# Patient Record
Sex: Male | Born: 1945 | Race: White | Hispanic: No | Marital: Married | Smoking: Never smoker
Health system: Southern US, Community
[De-identification: ages and names within clinical notes are randomized; demographics above are authoritative.]

## PROBLEM LIST (undated history)

## (undated) DIAGNOSIS — E039 Hypothyroidism, unspecified: Secondary | ICD-10-CM

## (undated) DIAGNOSIS — M1990 Unspecified osteoarthritis, unspecified site: Secondary | ICD-10-CM

---

## 2012-10-23 ENCOUNTER — Telehealth: Payer: Self-pay | Admitting: *Deleted

## 2012-10-23 NOTE — Telephone Encounter (Signed)
[facility] faxed over notes with a request for a Screening Colon on pt. Pt has recent dx of prostate cancer and is in the process of making a decision on his tx. He has no hx in the [facility] and per our conversation, he has no other medical hx that could prevent him from having a Direct Colon. I also spoke with Cathlyn Parsons, CRNA and he sees no problem with a [facility] COLON.  Pt could not speak freely at work and I tried to explain, if COLON is not done before his prostate tx, he may wait months to have a COLON done. Also informed him, Dr Brunilda Payor may want this done prior to eliminate any other possible problems d/t the close proximity od the colon and prostate gland.  Pt will call me back.

## 2012-10-28 ENCOUNTER — Encounter: Payer: Self-pay | Admitting: Gastroenterology

## 2012-10-30 ENCOUNTER — Other Ambulatory Visit: Payer: Self-pay | Admitting: Urology

## 2012-11-07 ENCOUNTER — Ambulatory Visit: Payer: Self-pay | Admitting: Gastroenterology

## 2012-11-10 NOTE — Telephone Encounter (Signed)
Pt has never called me back.

## 2012-12-15 ENCOUNTER — Encounter (HOSPITAL_COMMUNITY): Payer: Self-pay | Admitting: Pharmacy Technician

## 2012-12-18 ENCOUNTER — Encounter (HOSPITAL_COMMUNITY): Payer: Self-pay

## 2012-12-18 ENCOUNTER — Encounter (HOSPITAL_COMMUNITY)
Admission: RE | Admit: 2012-12-18 | Discharge: 2012-12-18 | Disposition: A | Discharge status: Home / Self Care | Payer: 59 | Source: Ambulatory Visit | Attending: Urology | Admitting: Urology

## 2012-12-18 HISTORY — DX: Hypothyroidism, unspecified: E03.9

## 2012-12-18 HISTORY — DX: Unspecified osteoarthritis, unspecified site: M19.90

## 2012-12-18 LAB — ABO/RH: ABO/RH(D): B NEG

## 2012-12-18 LAB — CBC
HCT: 37.8 % — ABNORMAL LOW (ref 39.0–52.0)
Hemoglobin: 13.1 g/dL (ref 13.0–17.0)
MCH: 30.5 pg (ref 26.0–34.0)
MCHC: 34.7 g/dL (ref 30.0–36.0)
MCV: 87.9 fL (ref 78.0–100.0)
Platelets: 211 10*3/uL (ref 150–400)
RBC: 4.3 MIL/uL (ref 4.22–5.81)
RDW: 12.7 % (ref 11.5–15.5)
WBC: 7.2 10*3/uL (ref 4.0–10.5)

## 2012-12-18 NOTE — Patient Instructions (Signed)
Jermaine Soto  12/18/2012   Your procedure is scheduled on:  12/24/12   Report to [facility] at    0630  AM.  Call this number if you have problems the morning of surgery: [phone]   Remember:   Do not eat food or drink liquids after midnight.   Take these medicines the morning of surgery with A SIP OF WATER:    Do not wear jewelry,   Do not wear lotions, powders, or perfumes. .  . Men may shave face and neck.  Do not bring valuables to the hospital.  Contacts, dentures or bridgework may not be worn into surgery.  Leave suitcase in the car. After surgery it may be brought to your room.  For patients admitted to the hospital, checkout time is 11:00 AM the day of  discharge.       SEE CHG INSTRUCTION SHEET    Please read over the following fact sheets that you were given:  coughing and deep breathing exercises, leg exercises               Failure to comply with these instructions may result in cancellation of your surgery.                Patient Signature ____________________________              Nurse Signature _____________________________

## 2012-12-23 NOTE — H&P (Signed)
Reason For Visit     Robotic surgery consultation for recent prostate cancer diagnosis. Patient of Dr. Vernia Buff Nesi's  AUA score 10/2.    SHIM 8/25.   History of Present Illness  Jermaine Soto is currently 67 years of age. He was originally sent to our office with a PSA elevation of just over 6. Repeat PSA was better at 4.33 but there was a significantly reduced PSA to 12%. Digital rectum exam showed nothing of concern. The patient had moderate obstructive voiding symptoms with an AUA symptom score of 10. Prostate ultrasound revealed a 54 g gland. Biopsies were positive at the left apex and left lateral mid prostate. 2 of 12 cores were positive. Both were for Gleason 3+4 equals 7 cancer with 5-30% core involvement. No prior intra-abdominal surgery. The patient and his wife have undergone consultation with Dr. Brunilda Payor with regard to treatment options.   Past Medical History Problems  1. History of  Hyperthyroidism 242.90  Surgical History Problems  1. History of  Surgery Of Male Genitalia Vasectomy V25.2  Current Meds 1. Adult Aspirin Low Strength 81 MG Oral Tablet Dispersible; Therapy: (Recorded:11Apr2014) to 2. Levothyroxine Sodium 50 MCG Oral Tablet; Therapy: (Recorded:11Apr2014) to 3. Multi-Day TABS; Therapy: (Recorded:11Apr2014) to 4. Viagra 100 MG Oral Tablet; Therapy: (Recorded:11Apr2014) to 5. Vitamin D3 TABS; Therapy: (Recorded:11Apr2014) to  Allergies Medication  1. Penicillins  Family History Problems  1. Paternal history of  Acute Myocardial Infarction V17.3 2. Maternal history of  Acute Myocardial Infarction V17.3 3. Paternal history of  Death In The Family Father 62yrs, MI 4. Maternal history of  Death In The Family Mother 27yrs, MI 5. Paternal history of  Diabetes Mellitus V18.0 6. Maternal history of  Diabetes Mellitus V18.0 7. Family history of  Family Health Status Number Of Children 2 sons  Social History Problems  1. Alcohol Use 1-2 q month 2. Caffeine Use 3-4  qd 3. Former Smoker V15.82 smoked 4 yrs, nonsmoker for the past 41yrs 4. Marital History - Currently Married 5. Occupation: Advertising account executive  Review of Systems Genitourinary, constitutional, skin, eye, otolaryngeal, hematologic/lymphatic, cardiovascular, pulmonary, endocrine, musculoskeletal, gastrointestinal, neurological and psychiatric system(s) were reviewed and pertinent findings if present are noted.  Genitourinary: urinary frequency, nocturia, difficulty starting the urinary stream, weak urinary stream, urinary stream starts and stops and erectile dysfunction.  Constitutional: night sweats and feeling tired (fatigue).  Musculoskeletal: joint pain.    Vitals Vital Signs [Data Includes: Last 1 Day]  Blood Pressure: 128 / 75 Temperature: 97.7 F Heart Rate: 78  Well-developed well-nourished male in no acute distress Respiratory: Normal effort Cardiac: Regular rate and rhythm Abdomen: Soft nontender no palpable masses Extremities: No tenderness no edema Genitourinary: 2+ prostate without nodules. 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 Follow-up Schedule Surgery Office  Follow-up  Requested for: 20May2014  Discussion/Summary  The patient was counseled about the natural history of prostate cancer and the standard treatment options that are available for prostate cancer. It was explained to him how his age and life expectancy, clinical stage, Gleason score, and PSA affect his prognosis, the decision to proceed with additional staging studies, as well as how that information influences recommended treatment strategies. We discussed the roles for active surveillance, radiation therapy, surgical therapy, androgen deprivation, as well as ablative therapy options for the treatment of prostate cancer as appropriate to his individual cancer situation. We discussed the risks and benefits  of these options with regard to their impact on cancer control and also in terms of potential adverse events, complications, and impact on quiality of life particularly related to urinary, bowel, and sexual function. The patient was encouraged to ask questions throughout the discussion today and all questions were answered to his stated satisfaction. In addition, the patient was provided with and/or directed to appropriate resources and literature for further education about prostate cancer and treatment options.   We discussed surgical therapy for prostate cancer including the different available surgical approaches. We discussed, in detail, the risks and expectations of surgery with regard to cancer control, urinary control, and erectile function as well as the expected postoperative recovery process. The risks, potential complications/adverse events of radical prostatectomy as well as alternative options were explained to the patient.   We discussed surgical therapy for prostate cancer including the different available surgical approaches. We discussed, in detail, the risks and expectations of surgery with regard to cancer control, urinary control, and erectile function as well as the expected postoperative recovery process. Additional risks of surgery including but not limited to bleeding, infection, hernia formation, nerve damage, lymphocele formation, bowel/rectal injury potentially necessitating colostomy, damage to the urinary tract resulting in urine leakage, urethral stricture, and the cardiopulmonary risks such as myocardial infarction, stroke, death, venothromboembolism, etc. were explained. The risk of open surgical conversion for robotic/laparoscopic prostatectomy was also discussed.   45 minutes were spent in face to face consultation with patient today.

## 2012-12-24 ENCOUNTER — Encounter (HOSPITAL_COMMUNITY)
Admission: RE | Disposition: A | Discharge status: Home / Self Care | Payer: Self-pay | Source: Ambulatory Visit | Attending: Urology

## 2012-12-24 ENCOUNTER — Ambulatory Visit (HOSPITAL_COMMUNITY): Payer: 59 | Admitting: Anesthesiology

## 2012-12-24 ENCOUNTER — Encounter (HOSPITAL_COMMUNITY): Payer: Self-pay | Admitting: *Deleted

## 2012-12-24 ENCOUNTER — Encounter (HOSPITAL_COMMUNITY): Payer: Self-pay | Admitting: Anesthesiology

## 2012-12-24 ENCOUNTER — Observation Stay (HOSPITAL_COMMUNITY)
Admission: RE | Admit: 2012-12-24 | Discharge: 2012-12-25 | Disposition: A | Discharge status: Home / Self Care | Payer: 59 | Source: Ambulatory Visit | Attending: Urology | Admitting: Urology

## 2012-12-24 DIAGNOSIS — C61 Malignant neoplasm of prostate: Principal | ICD-10-CM | POA: Insufficient documentation

## 2012-12-24 DIAGNOSIS — Z01812 Encounter for preprocedural laboratory examination: Secondary | ICD-10-CM | POA: Insufficient documentation

## 2012-12-24 DIAGNOSIS — Z79899 Other long term (current) drug therapy: Secondary | ICD-10-CM | POA: Insufficient documentation

## 2012-12-24 DIAGNOSIS — Z0183 Encounter for blood typing: Secondary | ICD-10-CM | POA: Insufficient documentation

## 2012-12-24 DIAGNOSIS — E039 Hypothyroidism, unspecified: Secondary | ICD-10-CM | POA: Insufficient documentation

## 2012-12-24 HISTORY — PX: ROBOT ASSISTED LAPAROSCOPIC RADICAL PROSTATECTOMY: SHX5141

## 2012-12-24 LAB — COMPREHENSIVE METABOLIC PANEL
ALT: 10 U/L (ref 0–53)
AST: 17 U/L (ref 0–37)
Albumin: 3.3 g/dL — ABNORMAL LOW (ref 3.5–5.2)
Alkaline Phosphatase: 39 U/L (ref 39–117)
BUN: 15 mg/dL (ref 6–23)
CO2: 25 mEq/L (ref 19–32)
Calcium: 8.6 mg/dL (ref 8.4–10.5)
Chloride: 100 mEq/L (ref 96–112)
Creatinine, Ser: 0.88 mg/dL (ref 0.50–1.35)
GFR calc Af Amer: >90 mL/min (ref >90)
GFR calc non Af Amer: 87 mL/min — ABNORMAL LOW (ref >90)
Glucose, Bld: 137 mg/dL — ABNORMAL HIGH (ref 70–99)
Potassium: 4.4 mEq/L (ref 3.5–5.1)
Sodium: 134 mEq/L — ABNORMAL LOW (ref 135–145)
Total Bilirubin: 0.4 mg/dL (ref 0.3–1.2)
Total Protein: 6.4 g/dL (ref 6.0–8.3)

## 2012-12-24 LAB — TYPE AND SCREEN
ABO/RH(D): B NEG
Antibody Screen: NEGATIVE

## 2012-12-24 LAB — HEMOGLOBIN AND HEMATOCRIT, BLOOD
HCT: 36.5 % — ABNORMAL LOW (ref 39.0–52.0)
HCT: 36.5 % — ABNORMAL LOW (ref 39.0–52.0)
Hemoglobin: 12.7 g/dL — ABNORMAL LOW (ref 13.0–17.0)
Hemoglobin: 12.6 g/dL — ABNORMAL LOW (ref 13.0–17.0)

## 2012-12-24 SURGERY — ROBOTIC ASSISTED LAPAROSCOPIC RADICAL PROSTATECTOMY
Anesthesia: General | Wound class: Clean Contaminated

## 2012-12-24 MED ORDER — HYDROMORPHONE HCL PF 1 MG/ML IJ SOLN: 0.2500 mg | INTRAMUSCULAR | Status: DC | PRN

## 2012-12-24 MED ORDER — CISATRACURIUM BESYLATE (PF) 10 MG/5ML IV SOLN
INTRAVENOUS | Status: DC | PRN
  Administered 2012-12-24: 4 mg via INTRAVENOUS
  Administered 2012-12-24: 10 mg via INTRAVENOUS
  Administered 2012-12-24: 2 mg via INTRAVENOUS

## 2012-12-24 MED ORDER — STERILE WATER FOR IRRIGATION IR SOLN
Status: DC | PRN
  Administered 2012-12-24: 3000 mL

## 2012-12-24 MED ORDER — LACTATED RINGERS IV SOLN
INTRAVENOUS | Status: DC | PRN
  Administered 2012-12-24: 10:00:00

## 2012-12-24 MED ORDER — MEPERIDINE HCL 50 MG/ML IJ SOLN: 6.2500 mg | INTRAMUSCULAR | Status: DC | PRN

## 2012-12-24 MED ORDER — PROMETHAZINE HCL 25 MG/ML IJ SOLN
INTRAMUSCULAR | Status: AC
  Filled 2012-12-24: qty 1

## 2012-12-24 MED ORDER — PROPOFOL 10 MG/ML IV BOLUS
INTRAVENOUS | Status: DC | PRN
  Administered 2012-12-24: 150 mg via INTRAVENOUS

## 2012-12-24 MED ORDER — ACETAMINOPHEN 10 MG/ML IV SOLN
1000.0000 mg | Freq: Four times a day (QID) | INTRAVENOUS | Status: DC
  Administered 2012-12-24 – 2012-12-25 (×3): 1000 mg via INTRAVENOUS
  Filled 2012-12-24 (×6): qty 100

## 2012-12-24 MED ORDER — LACTATED RINGERS IV SOLN
INTRAVENOUS | Status: DC | PRN
  Administered 2012-12-24 (×2): via INTRAVENOUS

## 2012-12-24 MED ORDER — MORPHINE SULFATE 2 MG/ML IJ SOLN
INTRAMUSCULAR | Status: AC
  Administered 2012-12-24: 2 mg via INTRAVENOUS
  Filled 2012-12-24: qty 1

## 2012-12-24 MED ORDER — HYDROCODONE-ACETAMINOPHEN 5-325 MG PO TABS: 1.0000 | ORAL_TABLET | Freq: Four times a day (QID) | ORAL | Status: DC | PRN

## 2012-12-24 MED ORDER — MORPHINE SULFATE 2 MG/ML IJ SOLN
2.0000 mg | INTRAMUSCULAR | Status: DC | PRN
  Administered 2012-12-24 – 2012-12-25 (×3): 2 mg via INTRAVENOUS
  Filled 2012-12-24 (×4): qty 1

## 2012-12-24 MED ORDER — NEOSTIGMINE METHYLSULFATE 1 MG/ML IJ SOLN
INTRAMUSCULAR | Status: DC | PRN
  Administered 2012-12-24: 4 mg via INTRAVENOUS

## 2012-12-24 MED ORDER — LACTATED RINGERS IV SOLN: INTRAVENOUS | Status: DC

## 2012-12-24 MED ORDER — LIDOCAINE HCL (CARDIAC) 20 MG/ML IV SOLN
INTRAVENOUS | Status: DC | PRN
  Administered 2012-12-24: 100 mg via INTRAVENOUS

## 2012-12-24 MED ORDER — MIDAZOLAM HCL 5 MG/5ML IJ SOLN
INTRAMUSCULAR | Status: DC | PRN
  Administered 2012-12-24: 2 mg via INTRAVENOUS

## 2012-12-24 MED ORDER — INDIGOTINDISULFONATE SODIUM 8 MG/ML IJ SOLN
INTRAMUSCULAR | Status: DC | PRN
  Administered 2012-12-24 (×2): 40 mg via INTRAVENOUS

## 2012-12-24 MED ORDER — PROMETHAZINE HCL 25 MG/ML IJ SOLN
6.2500 mg | INTRAMUSCULAR | Status: DC | PRN
  Administered 2012-12-24: 6.25 mg via INTRAVENOUS

## 2012-12-24 MED ORDER — ONDANSETRON HCL 4 MG/2ML IJ SOLN
INTRAMUSCULAR | Status: AC
  Administered 2012-12-24: 4 mg
  Filled 2012-12-24: qty 2

## 2012-12-24 MED ORDER — BUPIVACAINE-EPINEPHRINE 0.25% -1:200000 IJ SOLN
INTRAMUSCULAR | Status: DC | PRN
  Administered 2012-12-24: 25 mL

## 2012-12-24 MED ORDER — SUFENTANIL CITRATE 50 MCG/ML IV SOLN
INTRAVENOUS | Status: DC | PRN
  Administered 2012-12-24 (×2): 10 ug via INTRAVENOUS
  Administered 2012-12-24 (×2): 5 ug via INTRAVENOUS

## 2012-12-24 MED ORDER — HEPARIN SODIUM (PORCINE) 1000 UNIT/ML IJ SOLN
INTRAMUSCULAR | Status: AC
  Filled 2012-12-24: qty 1

## 2012-12-24 MED ORDER — EPHEDRINE SULFATE 50 MG/ML IJ SOLN
INTRAMUSCULAR | Status: DC | PRN
  Administered 2012-12-24: 10 mg via INTRAVENOUS

## 2012-12-24 MED ORDER — ONDANSETRON HCL 4 MG/2ML IJ SOLN
INTRAMUSCULAR | Status: DC | PRN
  Administered 2012-12-24: 4 mg via INTRAVENOUS

## 2012-12-24 MED ORDER — SODIUM CHLORIDE 0.9 % IV BOLUS (SEPSIS)
1000.0000 mL | Freq: Once | INTRAVENOUS | Status: AC
  Administered 2012-12-24: 1000 mL via INTRAVENOUS

## 2012-12-24 MED ORDER — ONDANSETRON HCL 4 MG/2ML IJ SOLN: 4.0000 mg | INTRAMUSCULAR | Status: DC | PRN

## 2012-12-24 MED ORDER — CIPROFLOXACIN HCL 500 MG PO TABS: 500.0000 mg | ORAL_TABLET | Freq: Two times a day (BID) | ORAL | Status: DC

## 2012-12-24 MED ORDER — DEXTROSE-NACL 5-0.45 % IV SOLN
INTRAVENOUS | Status: DC
  Administered 2012-12-24 – 2012-12-25 (×3): via INTRAVENOUS

## 2012-12-24 MED ORDER — SUCCINYLCHOLINE CHLORIDE 20 MG/ML IJ SOLN
INTRAMUSCULAR | Status: DC | PRN
  Administered 2012-12-24: 100 mg via INTRAVENOUS

## 2012-12-24 MED ORDER — CEFAZOLIN SODIUM-DEXTROSE 2-3 GM-% IV SOLR
2.0000 g | INTRAVENOUS | Status: AC
  Administered 2012-12-24: 2 g via INTRAVENOUS

## 2012-12-24 MED ORDER — GLYCOPYRROLATE 0.2 MG/ML IJ SOLN
INTRAMUSCULAR | Status: DC | PRN
  Administered 2012-12-24: .6 mg via INTRAVENOUS

## 2012-12-24 MED ORDER — CEFAZOLIN SODIUM-DEXTROSE 2-3 GM-% IV SOLR
INTRAVENOUS | Status: AC
  Filled 2012-12-24: qty 50

## 2012-12-24 MED ORDER — INDIGOTINDISULFONATE SODIUM 8 MG/ML IJ SOLN
INTRAMUSCULAR | Status: AC
  Filled 2012-12-24: qty 10

## 2012-12-24 MED ORDER — HYDROCODONE-ACETAMINOPHEN 5-325 MG PO TABS: 1.0000 | ORAL_TABLET | ORAL | Status: DC | PRN

## 2012-12-24 MED ORDER — SODIUM CHLORIDE 0.9 % IR SOLN
Status: DC | PRN
  Administered 2012-12-24: 1000 mL

## 2012-12-24 MED ORDER — LEVOTHYROXINE SODIUM 50 MCG PO TABS
50.0000 ug | ORAL_TABLET | Freq: Every day | ORAL | Status: DC
  Administered 2012-12-25: 50 ug via ORAL
  Filled 2012-12-24 (×2): qty 1

## 2012-12-24 MED ORDER — BUPIVACAINE-EPINEPHRINE PF 0.25-1:200000 % IJ SOLN
INTRAMUSCULAR | Status: AC
  Filled 2012-12-24: qty 30

## 2012-12-24 SURGICAL SUPPLY — 44 items
CANISTER SUCTION 2500CC (MISCELLANEOUS) ×2 IMPLANT
CATH FOLEY 2WAY SLVR 18FR 30CC (CATHETERS) ×2 IMPLANT
CATH ROBINSON RED A/P 16FR (CATHETERS) ×2 IMPLANT
CATH ROBINSON RED A/P 8FR (CATHETERS) ×2 IMPLANT
CATH TIEMANN FOLEY 18FR 5CC (CATHETERS) ×2 IMPLANT
CHLORAPREP W/TINT 26ML (MISCELLANEOUS) ×2 IMPLANT
CLIP LIGATING HEM O LOK PURPLE (MISCELLANEOUS) IMPLANT
CLOTH BEACON ORANGE TIMEOUT ST (SAFETY) ×2 IMPLANT
CORD HIGH FREQUENCY UNIPOLAR (ELECTROSURGICAL) ×2 IMPLANT
COVER SURGICAL LIGHT HANDLE (MISCELLANEOUS) ×2 IMPLANT
COVER TIP SHEARS 8 DVNC (MISCELLANEOUS) ×1 IMPLANT
COVER TIP SHEARS 8MM DA VINCI (MISCELLANEOUS) ×1
CUTTER ECHEON FLEX ENDO 45 340 (ENDOMECHANICALS) ×4 IMPLANT
DECANTER SPIKE VIAL GLASS SM (MISCELLANEOUS) IMPLANT
DRAPE SURG IRRIG POUCH 19X23 (DRAPES) ×2 IMPLANT
DRSG TEGADERM 2-3/8X2-3/4 SM (GAUZE/BANDAGES/DRESSINGS) ×8 IMPLANT
DRSG TEGADERM 4X4.75 (GAUZE/BANDAGES/DRESSINGS) ×4 IMPLANT
DRSG TEGADERM 6X8 (GAUZE/BANDAGES/DRESSINGS) ×4 IMPLANT
ELECT REM PT RETURN 9FT ADLT (ELECTROSURGICAL) ×2
ELECTRODE REM PT RTRN 9FT ADLT (ELECTROSURGICAL) ×1 IMPLANT
GAUZE SPONGE 2X2 8PLY STRL LF (GAUZE/BANDAGES/DRESSINGS) IMPLANT
GLOVE BIO SURGEON STRL SZ 6.5 (GLOVE) ×2 IMPLANT
GLOVE BIOGEL M STRL SZ7.5 (GLOVE) ×4 IMPLANT
GOWN PREVENTION PLUS XLARGE (GOWN DISPOSABLE) ×2 IMPLANT
GOWN STRL NON-REIN LRG LVL3 (GOWN DISPOSABLE) ×2 IMPLANT
GOWN STRL REIN XL XLG (GOWN DISPOSABLE) ×4 IMPLANT
HOLDER FOLEY CATH W/STRAP (MISCELLANEOUS) ×2 IMPLANT
IV LACTATED RINGERS 1000ML (IV SOLUTION) ×2 IMPLANT
KIT ACCESSORY DA VINCI DISP (KITS) ×1
KIT ACCESSORY DVNC DISP (KITS) ×1 IMPLANT
NDL SAFETY ECLIPSE 18X1.5 (NEEDLE) ×1 IMPLANT
NEEDLE HYPO 18GX1.5 SHARP (NEEDLE) ×1
PACK ROBOT UROLOGY CUSTOM (CUSTOM PROCEDURE TRAY) ×2 IMPLANT
RELOAD GREEN ECHELON 45 (STAPLE) ×4 IMPLANT
SEALER TISSUE G2 CVD JAW 45CM (ENDOMECHANICALS) ×2 IMPLANT
SET TUBE IRRIG SUCTION NO TIP (IRRIGATION / IRRIGATOR) ×2 IMPLANT
SOLUTION ELECTROLUBE (MISCELLANEOUS) ×2 IMPLANT
SPONGE GAUZE 2X2 STER 10/PKG (GAUZE/BANDAGES/DRESSINGS)
SUT VIC AB 2-0 SH 27 (SUTURE) ×1
SUT VIC AB 2-0 SH 27X BRD (SUTURE) ×1 IMPLANT
SUT VICRYL 0 UR6 27IN ABS (SUTURE) ×2 IMPLANT
SYR 27GX1/2 1ML LL SAFETY (SYRINGE) ×2 IMPLANT
TOWEL OR NON WOVEN STRL DISP B (DISPOSABLE) ×2 IMPLANT
WATER STERILE IRR 1500ML POUR (IV SOLUTION) ×4 IMPLANT

## 2012-12-24 NOTE — Progress Notes (Signed)
Day of Surgery Post-op note  Subjective: The patient is doing well.  No complaints except cold. Denies N/V.  Objective: Vital signs in last 24 hours: Temp:  [94.5 F (34.7 C)-98.4 F (36.9 C)] 97.4 F (36.3 C) (07/16 1545) Pulse Rate:  [58-76] 65 (07/16 1353) Resp:  [8-18] 14 (07/16 1353) BP: (125-150)/(61-79) 137/73 mmHg (07/16 1353) SpO2:  [100 %] 100 % (07/16 1353) Weight:  [94.2 kg (207 lb 10.8 oz)] 94.2 kg (207 lb 10.8 oz) (07/16 1217)  Intake/Output from previous day:   Intake/Output this shift: Total I/O In: 1800 [I.V.:1800] Out: 340 [Urine:75; Drains:190; Blood:75]  Physical Exam:  General: Alert and oriented. Abdomen: Soft, Nondistended. Incisions: Clean and dry.  Lab Results:  Recent Labs  12/24/12 1134  HGB 12.7*  HCT 36.5*    Assessment/Plan: POD#0   1) Continue to monitor 2) Had significant drainage from JP this afternoon.  Monitor.  May need to send for Cr in a.m. 3) Amb, IS, DVT prophy, clears, pain control     LOS: 0 days   YARBROUGH,[place] Moise G. 12/24/2012, 3:47 PM

## 2012-12-24 NOTE — Progress Notes (Signed)
Patient ID: Jermaine Soto, male   DOB: 23-Mar-1946, 67 y.o.   MRN: [id number] RN calls this pm stating that pt had has 250 ml bloody drainage from JP drain over 4 hrs. Will check stat H/H and CMET. RN denies any signs of distress ie dizziness, chest pain, or abdominal pain.

## 2012-12-24 NOTE — Anesthesia Postprocedure Evaluation (Signed)
  Anesthesia Post-op Note  Patient: Jermaine Soto  Procedure(s) Performed: Procedure(s) (LRB): ROBOTIC ASSISTED LAPAROSCOPIC RADICAL PROSTATECTOMY (N/A)  Patient Location: PACU  Anesthesia Type: General  Level of Consciousness: awake and alert   Airway and Oxygen Therapy: Patient Spontanous Breathing  Post-op Pain: mild  Post-op Assessment: Post-op Vital signs reviewed, Patient's Cardiovascular Status Stable, Respiratory Function Stable, Patent Airway and No signs of Nausea or vomiting  Last Vitals:  Filed Vitals:   12/24/12 1217  BP: 130/61  Pulse: 58  Temp:   Resp: 12    Post-op Vital Signs: stable   Complications: No apparent anesthesia complications

## 2012-12-24 NOTE — Interval H&P Note (Signed)
History and Physical Interval Note:  12/24/2012 8:22 AM  Jermaine Soto  has presented today for surgery, with the diagnosis of  PROSTATE CANCER  The various methods of treatment have been discussed with the patient and family. After consideration of risks, benefits and other options for treatment, the patient has consented to  Procedure(s): ROBOTIC ASSISTED LAPAROSCOPIC RADICAL PROSTATECTOMY (N/A) as a surgical intervention .  The patient's history has been reviewed, patient examined, no change in status, stable for surgery.  I have reviewed the patient's chart and labs.  Questions were answered to the patient's satisfaction.     Consuelo Suthers S

## 2012-12-24 NOTE — Anesthesia Preprocedure Evaluation (Signed)
Anesthesia Evaluation    Airway       Dental   Pulmonary          Cardiovascular     Neuro/Psych    GI/Hepatic   Endo/Other  Hypothyroidism   Renal/GU      Musculoskeletal   Abdominal   Peds  Hematology   Anesthesia Other Findings   Reproductive/Obstetrics                           Anesthesia Physical Anesthesia Plan  ASA: II  Anesthesia Plan: General   Post-op Pain Management:    Induction: Intravenous  Airway Management Planned: Oral ETT  Additional Equipment:   Intra-op Plan:   Post-operative Plan: Extubation in OR  Informed Consent: I have reviewed the patients History and Physical, chart, labs and discussed the procedure including the risks, benefits and alternatives for the proposed anesthesia with the patient or authorized representative who has indicated his/her understanding and acceptance.   Dental advisory given  Plan Discussed with: CRNA  Anesthesia Plan Comments:         Anesthesia Quick Evaluation

## 2012-12-24 NOTE — Transfer of Care (Signed)
Immediate Anesthesia Transfer of Care Note  Patient: Jermaine Soto  Procedure(s) Performed: Procedure(s): ROBOTIC ASSISTED LAPAROSCOPIC RADICAL PROSTATECTOMY (N/A)  Patient Location: PACU  Anesthesia Type:General  Level of Consciousness: awake, alert , oriented and patient cooperative  Airway & Oxygen Therapy: Patient Spontanous Breathing and Patient connected to face mask oxygen  Post-op Assessment: Report given to PACU RN, Post -op Vital signs reviewed and stable and Patient moving all extremities X 4  Post vital signs: Reviewed and stable  Complications: No apparent anesthesia complications

## 2012-12-24 NOTE — Op Note (Signed)
Preoperative diagnosis: Clinical stage T1c Adenocarcinoma prostate  Postoperative diagnosis: Same  Procedure: Robotic-assisted laparoscopic radical retropubic prostatectomy Surgeon: Valetta Fuller, MD  Asst.: Pecola Leisure, PA Anesthesia: Gen. Endotracheal  Indications: Patient was diagnosed with clinical stage TIc Adenocarcinoma the prostate. He underwent extensive consultation with regard to treatment options. The patient decided on a surgical approach. He appeared to understand the distinct advantages as well as the disadvantages of this procedure. The patient has performed a mechanical bowel prep. He has had placement of PAS compression boots and has received perioperative antibiotics. The patient's preoperative PSA was 4.3. Ultrasound revealed a 54 g prostate.   Technique and findings:The patient was brought to the operating room and had successful induction of general endotracheal anesthesia.the patient was placed in a low lithotomy position with careful padding of all extremities. He was secured to the operative table and placed in the steep Trendelenburg position. He was prepped and draped in usual manner. A Foley catheter was placed sterilely on the field. Camera port site was chosen 18 cm above the pubic symphysis just to the left of the umbilicus. A standard open Hassan technique was utilized. A 12 mm trocar was placed without difficulty. The camera was then inserted and no abnormalities were noted within the pelvis. The trochars were placed with direct visual guidance. This included 3 8mm robotic trochars and a 12 mm and 5 mm assist ports. Once all the ports were placed the robot was docked. The bladder was filled and the space of Retzius was developed with electrocautery dissection as well as blunt dissection. Superficial fat off the endopelvic fascia and bladder neck was removed with electrocautery scissors. The endopelvic fascia was then incised bilaterally from base to apex. Levator  musculature was swept off the apex of the prostate isolating the dorsal venous complex which was then stapled with the ETS stapling device. The anterior bladder neck was identified with the aid of the Foley balloon. This was then transected down to the Foley catheter with electrocautery scissors. The Foley catheter was then retracted anteriorly. Indigo carmine was given and we appeared to be well away from the ureteral orifices. The posterior bladder neck was then transected and the dissection carried down to the adnexal structures. The seminal vesicles and vas deferens on both sides were then individually dissected free and retracted anteriorly. The posterior plane between the rectum and prostate was then established primarily with blunt dissection.  Attention was then turned towards nerve sparing. The patient was felt to be a candidate for bilateral nerve sparing. Superficial fascia along the anterior lateral aspect of the prostate was incised bilaterally. This tissue was then swept laterally until we were able to establish a groove between the neurovascular tissue and the posterior lateral aspect on the prostate bilaterally. This groove was then extended from the apex back to the base of the prostate. With the prostate retracted anteriorly the vascular pedicles of the prostate were taken with the Enseal device. The Foley catheter was then reinserted and the anterior urethra was transected. The posterior urethra was then transected as were some rectourethralis fibers. The prostate was then removed from the pelvis. The pelvis was then copiously irrigated. Rectal insufflation was performed and there was no evidence of rectal injury.   Attention was then turned towards reconstruction. The bladder neck did not require any reconstruction. The bladder neck and posterior urethra were reapproximated at the 6:00 position utilizing a 2-0 Vicryl suture. The rest of the anastomosis was done with a double-armed 3-0  Monocryl  suture in a 360 degree manner. Additional indigo carmine was given. A new catheter was placed and bladder irrigation revealed no evidence of leakage. A Blake drain was placed through one of the robotic trochars and positioned in the retropubic space above the anastomosis. This was then secured to the skin with a nylon suture. The prostate was placed in the Endopouch retrieval bag. The 12 mm trocar site was closed with a Vicryl suture with the aid of a suture passer. Our other trochars were taken out with direct visual guidance without evidence of any bleeding. The camera port incision was extended slightly to allow for removal of the specimen and then closed with a running Vicryl suture. All port sites were infiltrated with Marcaine and then closed with surgical clips. The patient was then taken to recovery room having had no obvious complications or problems. Sponge and needle counts were correct.

## 2012-12-24 NOTE — Progress Notes (Signed)
Upon receiving pt from PACU at about 1230 this afternoon we had trouble getting his temperature. Had to check a rectal temp and it was 95.0 dgrees F. Pt given warm blankets and covered up to help increase temp.  MD made aware and no new order received. At 1830, JP drain was emptied for the end of shift and since 1230 pt has had a total of of bloody output from JP drain. NP on call made aware and orders received for STAT H&H and CMET. Orders placed and will call NP back if lab values abnormal. Will continue to monitor pt and output from JP drain and foley catheter.   Arta Mylin [facility] 12/24/2012 6:45 PM

## 2012-12-25 ENCOUNTER — Encounter (HOSPITAL_COMMUNITY): Payer: Self-pay | Admitting: Urology

## 2012-12-25 LAB — BASIC METABOLIC PANEL
BUN: 11 mg/dL (ref 6–23)
CO2: 27 mEq/L (ref 19–32)
Calcium: 8.3 mg/dL — ABNORMAL LOW (ref 8.4–10.5)
Chloride: 101 mEq/L (ref 96–112)
Creatinine, Ser: 0.92 mg/dL (ref 0.50–1.35)
GFR calc Af Amer: >90 mL/min (ref >90)
GFR calc non Af Amer: 85 mL/min — ABNORMAL LOW (ref >90)
Glucose, Bld: 130 mg/dL — ABNORMAL HIGH (ref 70–99)
Potassium: 4 mEq/L (ref 3.5–5.1)
Sodium: 135 mEq/L (ref 135–145)

## 2012-12-25 LAB — HEMOGLOBIN AND HEMATOCRIT, BLOOD
HCT: 33.5 % — ABNORMAL LOW (ref 39.0–52.0)
Hemoglobin: 11.7 g/dL — ABNORMAL LOW (ref 13.0–17.0)

## 2012-12-25 LAB — CREATININE, FLUID (PLEURAL, PERITONEAL, JP DRAINAGE): Creat, Fluid: 0.9 mg/dL

## 2012-12-25 MED ORDER — BISACODYL 10 MG RE SUPP
10.0000 mg | Freq: Once | RECTAL | Status: AC
  Administered 2012-12-25: 10 mg via RECTAL
  Filled 2012-12-25: qty 1

## 2012-12-25 NOTE — Progress Notes (Signed)
1 Day Post-Op Subjective: Patient reports pain control good.  Denies N/V.  Has amb.  JP outpt decreased overnight  Objective: Vital signs in last 24 hours: Temp:  [94.5 F (34.7 C)-98.3 F (36.8 C)] 97.7 F (36.5 C) (07/17 0438) Pulse Rate:  [58-89] 83 (07/17 0438) Resp:  [8-18] 18 (07/17 0438) BP: (119-150)/(59-79) 119/66 mmHg (07/17 0438) SpO2:  [94 %-100 %] 94 % (07/17 0438) Weight:  [94.2 kg (207 lb 10.8 oz)] 94.2 kg (207 lb 10.8 oz) (07/16 1217)  Intake/Output from previous day: 07/16 0701 - 07/17 0700 In: 4458.8 [P.O.:240; I.V.:3918.8; IV Piggyback:300] Out: 2835 [Urine:2425; Drains:335; Blood:75] Intake/Output this shift:    Physical Exam:  General:alert, cooperative and no distress Cardiovascular: RRR Lungs: faint crackles bilat GI: soft, non tender, decreased bowel sounds, no palpable masses Incisions: C/D/I Urine: yellow Extremities: SCDs in place  Lab Results:  Recent Labs  12/24/12 1134 12/24/12 1836 12/25/12 0406  HGB 12.7* 12.6* 11.7*  HCT 36.5* 36.5* 33.5*   BMET  Recent Labs  12/24/12 1836 12/25/12 0406  NA 134* 135  K 4.4 4.0  CL 100 101  CO2 25 27  GLUCOSE 137* 130*  BUN 15 11  CREATININE 0.88 0.92  CALCIUM 8.6 8.3*   No results found for this basename: LABPT, INR,  in the last 72 hours No results found for this basename: LABURIN,  in the last 72 hours No results found for this or any previous visit.  Studies/Results: No results found.  Assessment/Plan: 1 Day Post-Op, Procedure(s) (LRB): ROBOTIC ASSISTED LAPAROSCOPIC RADICAL PROSTATECTOMY (N/A)  Ambulate, Incentive spirometry DVT prophylaxis Transition to PO pain medications Check drain creatinine level SL IVF    Increased JP output yesterday was likely due to irrigation used during procedure.  Has decreased and H/H stable but will check drain fluid Cr to be thorough.    Dulcolax supp  Plan for d/c later today.  LOS: 1 day   YARBROUGH,[place] G. 12/25/2012, 7:21  AM

## 2012-12-25 NOTE — Discharge Summary (Signed)
  Date of admission: 12/24/2012  Date of discharge: 12/25/2012  Admission diagnosis: Prostate Cancer  Discharge diagnosis: Prostate Cancer  History and Physical: For full details, please see admission history and physical. Briefly, Jermaine Soto is a 67 y.o. gentleman with localized prostate cancer.  After discussing management/treatment options, he elected to proceed with surgical treatment.  Hospital Course: Mcguire Gasparyan was taken to the operating room on 12/24/2012 and underwent a robotic assisted laparoscopic radical prostatectomy. He tolerated this procedure well and without complications. Postoperatively, he was able to be transferred to a regular hospital room following recovery from anesthesia.  He was able to begin ambulating the night of surgery. He remained hemodynamically stable overnight.  He had excellent urine output with appropriately minimal output from his pelvic drain and his pelvic drain was removed on POD #1.  He was transitioned to oral pain medication, tolerated a clear liquid diet, and had met all discharge criteria and was able to be discharged home later on POD#1.  Laboratory values:  Recent Labs  12/24/12 1134 12/24/12 1836 12/25/12 0406  HGB 12.7* 12.6* 11.7*  HCT 36.5* 36.5* 33.5*    Disposition: Home  Discharge instruction: He was instructed to be ambulatory but to refrain from heavy lifting, strenuous activity, or driving. He was instructed on urethral catheter care.  Discharge medications:     Medication List    STOP taking these medications       aspirin EC 81 MG tablet     cholecalciferol 1000 UNITS tablet  Commonly known as:  VITAMIN D     multivitamin with minerals Tabs      TAKE these medications       ciprofloxacin 500 MG tablet  Commonly known as:  CIPRO  Take 1 tablet (500 mg total) by mouth 2 (two) times daily. Start day prior to office visit for foley removal     HYDROcodone-acetaminophen 5-325 MG per tablet  Commonly known as:  NORCO   Take 1-2 tablets by mouth every 6 (six) hours as needed for pain.     levothyroxine 50 MCG tablet  Commonly known as:  SYNTHROID, LEVOTHROID  Take 50 mcg by mouth daily before breakfast.        Followup: He will followup in 1 week for catheter removal and to discuss his surgical pathology results.

## 2013-11-03 DIAGNOSIS — C61 Malignant neoplasm of prostate: Secondary | ICD-10-CM | POA: Diagnosis not present

## 2013-11-10 DIAGNOSIS — N529 Male erectile dysfunction, unspecified: Secondary | ICD-10-CM | POA: Diagnosis not present

## 2013-11-10 DIAGNOSIS — C61 Malignant neoplasm of prostate: Secondary | ICD-10-CM | POA: Diagnosis not present

## 2013-11-16 DIAGNOSIS — H40019 Open angle with borderline findings, low risk, unspecified eye: Secondary | ICD-10-CM | POA: Diagnosis not present

## 2014-03-18 DIAGNOSIS — C61 Malignant neoplasm of prostate: Secondary | ICD-10-CM | POA: Diagnosis not present

## 2014-03-22 DIAGNOSIS — C61 Malignant neoplasm of prostate: Secondary | ICD-10-CM | POA: Diagnosis not present

## 2014-03-26 DIAGNOSIS — L03032 Cellulitis of left toe: Secondary | ICD-10-CM | POA: Diagnosis not present

## 2014-03-26 DIAGNOSIS — M79675 Pain in left toe(s): Secondary | ICD-10-CM | POA: Diagnosis not present

## 2014-03-26 DIAGNOSIS — L03031 Cellulitis of right toe: Secondary | ICD-10-CM | POA: Diagnosis not present

## 2014-03-26 DIAGNOSIS — M79674 Pain in right toe(s): Secondary | ICD-10-CM | POA: Diagnosis not present

## 2014-03-30 DIAGNOSIS — D485 Neoplasm of uncertain behavior of skin: Secondary | ICD-10-CM | POA: Diagnosis not present

## 2014-04-05 DIAGNOSIS — M71572 Other bursitis, not elsewhere classified, left ankle and foot: Secondary | ICD-10-CM | POA: Diagnosis not present

## 2014-07-20 DIAGNOSIS — R972 Elevated prostate specific antigen [PSA]: Secondary | ICD-10-CM | POA: Diagnosis not present

## 2014-07-20 DIAGNOSIS — N401 Enlarged prostate with lower urinary tract symptoms: Secondary | ICD-10-CM | POA: Diagnosis not present

## 2014-07-20 DIAGNOSIS — C61 Malignant neoplasm of prostate: Secondary | ICD-10-CM | POA: Diagnosis not present

## 2014-07-27 DIAGNOSIS — C61 Malignant neoplasm of prostate: Secondary | ICD-10-CM | POA: Diagnosis not present

## 2014-09-27 DIAGNOSIS — H2513 Age-related nuclear cataract, bilateral: Secondary | ICD-10-CM | POA: Diagnosis not present

## 2014-09-27 DIAGNOSIS — H40013 Open angle with borderline findings, low risk, bilateral: Secondary | ICD-10-CM | POA: Diagnosis not present

## 2014-09-27 DIAGNOSIS — H25013 Cortical age-related cataract, bilateral: Secondary | ICD-10-CM | POA: Diagnosis not present

## 2014-11-09 DIAGNOSIS — L03031 Cellulitis of right toe: Secondary | ICD-10-CM | POA: Diagnosis not present

## 2014-11-09 DIAGNOSIS — M79674 Pain in right toe(s): Secondary | ICD-10-CM | POA: Diagnosis not present

## 2014-11-23 DIAGNOSIS — C61 Malignant neoplasm of prostate: Secondary | ICD-10-CM | POA: Diagnosis not present

## 2014-11-29 DIAGNOSIS — C61 Malignant neoplasm of prostate: Secondary | ICD-10-CM | POA: Diagnosis not present

## 2014-11-29 DIAGNOSIS — N5201 Erectile dysfunction due to arterial insufficiency: Secondary | ICD-10-CM | POA: Diagnosis not present

## 2014-12-03 DIAGNOSIS — E780 Pure hypercholesterolemia: Secondary | ICD-10-CM | POA: Diagnosis not present

## 2014-12-03 DIAGNOSIS — R7301 Impaired fasting glucose: Secondary | ICD-10-CM | POA: Diagnosis not present

## 2014-12-03 DIAGNOSIS — E039 Hypothyroidism, unspecified: Secondary | ICD-10-CM | POA: Diagnosis not present

## 2014-12-03 DIAGNOSIS — H9193 Unspecified hearing loss, bilateral: Secondary | ICD-10-CM | POA: Diagnosis not present

## 2015-03-10 DIAGNOSIS — E039 Hypothyroidism, unspecified: Secondary | ICD-10-CM | POA: Diagnosis not present

## 2015-05-19 DIAGNOSIS — C61 Malignant neoplasm of prostate: Secondary | ICD-10-CM | POA: Diagnosis not present

## 2015-05-25 DIAGNOSIS — D1801 Hemangioma of skin and subcutaneous tissue: Secondary | ICD-10-CM | POA: Diagnosis not present

## 2015-05-25 DIAGNOSIS — L821 Other seborrheic keratosis: Secondary | ICD-10-CM | POA: Diagnosis not present

## 2015-05-25 DIAGNOSIS — L570 Actinic keratosis: Secondary | ICD-10-CM | POA: Diagnosis not present

## 2015-05-26 DIAGNOSIS — Z8546 Personal history of malignant neoplasm of prostate: Secondary | ICD-10-CM | POA: Diagnosis not present

## 2015-05-26 DIAGNOSIS — N5201 Erectile dysfunction due to arterial insufficiency: Secondary | ICD-10-CM | POA: Diagnosis not present

## 2015-08-24 DIAGNOSIS — L821 Other seborrheic keratosis: Secondary | ICD-10-CM | POA: Diagnosis not present

## 2015-08-24 DIAGNOSIS — D1801 Hemangioma of skin and subcutaneous tissue: Secondary | ICD-10-CM | POA: Diagnosis not present

## 2015-08-24 DIAGNOSIS — L820 Inflamed seborrheic keratosis: Secondary | ICD-10-CM | POA: Diagnosis not present

## 2015-11-21 DIAGNOSIS — Z8546 Personal history of malignant neoplasm of prostate: Secondary | ICD-10-CM | POA: Diagnosis not present

## 2015-11-28 DIAGNOSIS — N5201 Erectile dysfunction due to arterial insufficiency: Secondary | ICD-10-CM | POA: Diagnosis not present

## 2015-11-28 DIAGNOSIS — Z8546 Personal history of malignant neoplasm of prostate: Secondary | ICD-10-CM | POA: Diagnosis not present

## 2015-12-22 DIAGNOSIS — Z23 Encounter for immunization: Secondary | ICD-10-CM | POA: Diagnosis not present

## 2015-12-22 DIAGNOSIS — E7800 Pure hypercholesterolemia, unspecified: Secondary | ICD-10-CM | POA: Diagnosis not present

## 2015-12-22 DIAGNOSIS — E039 Hypothyroidism, unspecified: Secondary | ICD-10-CM | POA: Diagnosis not present

## 2015-12-22 DIAGNOSIS — R7301 Impaired fasting glucose: Secondary | ICD-10-CM | POA: Diagnosis not present

## 2016-01-05 DIAGNOSIS — H2513 Age-related nuclear cataract, bilateral: Secondary | ICD-10-CM | POA: Diagnosis not present

## 2016-01-05 DIAGNOSIS — H40013 Open angle with borderline findings, low risk, bilateral: Secondary | ICD-10-CM | POA: Diagnosis not present

## 2016-05-25 DIAGNOSIS — C61 Malignant neoplasm of prostate: Secondary | ICD-10-CM | POA: Diagnosis not present

## 2016-05-31 ENCOUNTER — Other Ambulatory Visit: Payer: Self-pay | Admitting: Dermatology

## 2016-05-31 DIAGNOSIS — D225 Melanocytic nevi of trunk: Secondary | ICD-10-CM | POA: Diagnosis not present

## 2016-05-31 DIAGNOSIS — C44319 Basal cell carcinoma of skin of other parts of face: Secondary | ICD-10-CM | POA: Diagnosis not present

## 2016-05-31 DIAGNOSIS — L570 Actinic keratosis: Secondary | ICD-10-CM | POA: Diagnosis not present

## 2016-05-31 DIAGNOSIS — D1801 Hemangioma of skin and subcutaneous tissue: Secondary | ICD-10-CM | POA: Diagnosis not present

## 2016-05-31 DIAGNOSIS — L821 Other seborrheic keratosis: Secondary | ICD-10-CM | POA: Diagnosis not present

## 2016-05-31 DIAGNOSIS — D485 Neoplasm of uncertain behavior of skin: Secondary | ICD-10-CM | POA: Diagnosis not present

## 2016-06-01 DIAGNOSIS — N5201 Erectile dysfunction due to arterial insufficiency: Secondary | ICD-10-CM | POA: Diagnosis not present

## 2016-06-01 DIAGNOSIS — Z8546 Personal history of malignant neoplasm of prostate: Secondary | ICD-10-CM | POA: Diagnosis not present

## 2016-06-13 DIAGNOSIS — C44319 Basal cell carcinoma of skin of other parts of face: Secondary | ICD-10-CM | POA: Diagnosis not present

## 2016-07-24 DIAGNOSIS — Z85828 Personal history of other malignant neoplasm of skin: Secondary | ICD-10-CM | POA: Diagnosis not present

## 2016-07-24 DIAGNOSIS — L905 Scar conditions and fibrosis of skin: Secondary | ICD-10-CM | POA: Diagnosis not present

## 2016-10-15 DIAGNOSIS — D225 Melanocytic nevi of trunk: Secondary | ICD-10-CM | POA: Diagnosis not present

## 2016-10-15 DIAGNOSIS — L821 Other seborrheic keratosis: Secondary | ICD-10-CM | POA: Diagnosis not present

## 2016-10-15 DIAGNOSIS — D1800 Hemangioma unspecified site: Secondary | ICD-10-CM | POA: Diagnosis not present

## 2016-10-15 DIAGNOSIS — Z85828 Personal history of other malignant neoplasm of skin: Secondary | ICD-10-CM | POA: Diagnosis not present

## 2016-11-16 DIAGNOSIS — C61 Malignant neoplasm of prostate: Secondary | ICD-10-CM | POA: Diagnosis not present

## 2016-11-26 DIAGNOSIS — Z8546 Personal history of malignant neoplasm of prostate: Secondary | ICD-10-CM | POA: Diagnosis not present

## 2016-11-26 DIAGNOSIS — N5201 Erectile dysfunction due to arterial insufficiency: Secondary | ICD-10-CM | POA: Diagnosis not present

## 2016-12-31 DIAGNOSIS — H2513 Age-related nuclear cataract, bilateral: Secondary | ICD-10-CM | POA: Diagnosis not present

## 2016-12-31 DIAGNOSIS — H40013 Open angle with borderline findings, low risk, bilateral: Secondary | ICD-10-CM | POA: Diagnosis not present

## 2017-01-08 DIAGNOSIS — E7800 Pure hypercholesterolemia, unspecified: Secondary | ICD-10-CM | POA: Diagnosis not present

## 2017-01-08 DIAGNOSIS — Z0000 Encounter for general adult medical examination without abnormal findings: Secondary | ICD-10-CM | POA: Diagnosis not present

## 2017-01-08 DIAGNOSIS — E039 Hypothyroidism, unspecified: Secondary | ICD-10-CM | POA: Diagnosis not present

## 2017-01-08 DIAGNOSIS — R7301 Impaired fasting glucose: Secondary | ICD-10-CM | POA: Diagnosis not present

## 2017-05-01 DIAGNOSIS — M2041 Other hammer toe(s) (acquired), right foot: Secondary | ICD-10-CM | POA: Diagnosis not present

## 2017-05-01 DIAGNOSIS — M2042 Other hammer toe(s) (acquired), left foot: Secondary | ICD-10-CM | POA: Diagnosis not present

## 2017-05-01 DIAGNOSIS — L603 Nail dystrophy: Secondary | ICD-10-CM | POA: Diagnosis not present

## 2017-05-01 DIAGNOSIS — L602 Onychogryphosis: Secondary | ICD-10-CM | POA: Diagnosis not present

## 2017-05-10 DIAGNOSIS — L603 Nail dystrophy: Secondary | ICD-10-CM | POA: Diagnosis not present

## 2017-05-22 DIAGNOSIS — L602 Onychogryphosis: Secondary | ICD-10-CM | POA: Diagnosis not present

## 2017-05-22 DIAGNOSIS — M2042 Other hammer toe(s) (acquired), left foot: Secondary | ICD-10-CM | POA: Diagnosis not present

## 2017-05-22 DIAGNOSIS — M2041 Other hammer toe(s) (acquired), right foot: Secondary | ICD-10-CM | POA: Diagnosis not present

## 2017-05-22 DIAGNOSIS — C61 Malignant neoplasm of prostate: Secondary | ICD-10-CM | POA: Diagnosis not present

## 2017-05-29 DIAGNOSIS — N5201 Erectile dysfunction due to arterial insufficiency: Secondary | ICD-10-CM | POA: Diagnosis not present

## 2017-05-29 DIAGNOSIS — Z8546 Personal history of malignant neoplasm of prostate: Secondary | ICD-10-CM | POA: Diagnosis not present

## 2017-07-04 DIAGNOSIS — H40013 Open angle with borderline findings, low risk, bilateral: Secondary | ICD-10-CM | POA: Diagnosis not present

## 2017-08-21 DIAGNOSIS — L602 Onychogryphosis: Secondary | ICD-10-CM | POA: Diagnosis not present

## 2017-11-21 DIAGNOSIS — Z8546 Personal history of malignant neoplasm of prostate: Secondary | ICD-10-CM | POA: Diagnosis not present

## 2017-11-28 DIAGNOSIS — N5231 Erectile dysfunction following radical prostatectomy: Secondary | ICD-10-CM | POA: Diagnosis not present

## 2017-11-28 DIAGNOSIS — C61 Malignant neoplasm of prostate: Secondary | ICD-10-CM | POA: Diagnosis not present

## 2018-01-02 DIAGNOSIS — L821 Other seborrheic keratosis: Secondary | ICD-10-CM | POA: Diagnosis not present

## 2018-01-02 DIAGNOSIS — D2310 Other benign neoplasm of skin of unspecified eyelid, including canthus: Secondary | ICD-10-CM | POA: Diagnosis not present

## 2018-01-02 DIAGNOSIS — L814 Other melanin hyperpigmentation: Secondary | ICD-10-CM | POA: Diagnosis not present

## 2018-01-02 DIAGNOSIS — D229 Melanocytic nevi, unspecified: Secondary | ICD-10-CM | POA: Diagnosis not present

## 2018-01-02 DIAGNOSIS — L570 Actinic keratosis: Secondary | ICD-10-CM | POA: Diagnosis not present

## 2018-01-02 DIAGNOSIS — L578 Other skin changes due to chronic exposure to nonionizing radiation: Secondary | ICD-10-CM | POA: Diagnosis not present

## 2018-01-02 DIAGNOSIS — H2513 Age-related nuclear cataract, bilateral: Secondary | ICD-10-CM | POA: Diagnosis not present

## 2018-01-02 DIAGNOSIS — H40013 Open angle with borderline findings, low risk, bilateral: Secondary | ICD-10-CM | POA: Diagnosis not present

## 2018-01-02 DIAGNOSIS — Z85828 Personal history of other malignant neoplasm of skin: Secondary | ICD-10-CM | POA: Diagnosis not present

## 2018-02-26 DIAGNOSIS — D2310 Other benign neoplasm of skin of unspecified eyelid, including canthus: Secondary | ICD-10-CM | POA: Diagnosis not present

## 2018-03-25 DIAGNOSIS — E039 Hypothyroidism, unspecified: Secondary | ICD-10-CM | POA: Diagnosis not present

## 2018-03-25 DIAGNOSIS — Z8546 Personal history of malignant neoplasm of prostate: Secondary | ICD-10-CM | POA: Diagnosis not present

## 2018-03-25 DIAGNOSIS — E7800 Pure hypercholesterolemia, unspecified: Secondary | ICD-10-CM | POA: Diagnosis not present

## 2018-03-25 DIAGNOSIS — R7301 Impaired fasting glucose: Secondary | ICD-10-CM | POA: Diagnosis not present

## 2018-03-27 DIAGNOSIS — Z0000 Encounter for general adult medical examination without abnormal findings: Secondary | ICD-10-CM | POA: Diagnosis not present

## 2018-03-27 DIAGNOSIS — R7303 Prediabetes: Secondary | ICD-10-CM | POA: Diagnosis not present

## 2018-03-27 DIAGNOSIS — E039 Hypothyroidism, unspecified: Secondary | ICD-10-CM | POA: Diagnosis not present

## 2018-05-19 DIAGNOSIS — C61 Malignant neoplasm of prostate: Secondary | ICD-10-CM | POA: Diagnosis not present

## 2018-05-26 DIAGNOSIS — C61 Malignant neoplasm of prostate: Secondary | ICD-10-CM | POA: Diagnosis not present

## 2018-05-26 DIAGNOSIS — N5231 Erectile dysfunction following radical prostatectomy: Secondary | ICD-10-CM | POA: Diagnosis not present

## 2018-07-02 DIAGNOSIS — D120 Benign neoplasm of cecum: Secondary | ICD-10-CM | POA: Diagnosis not present

## 2018-07-02 DIAGNOSIS — K5730 Diverticulosis of large intestine without perforation or abscess without bleeding: Secondary | ICD-10-CM | POA: Diagnosis not present

## 2018-07-02 DIAGNOSIS — Z1211 Encounter for screening for malignant neoplasm of colon: Secondary | ICD-10-CM | POA: Diagnosis not present

## 2018-07-02 DIAGNOSIS — K6389 Other specified diseases of intestine: Secondary | ICD-10-CM | POA: Diagnosis not present

## 2018-07-04 DIAGNOSIS — D120 Benign neoplasm of cecum: Secondary | ICD-10-CM | POA: Diagnosis not present

## 2018-07-07 DIAGNOSIS — H40013 Open angle with borderline findings, low risk, bilateral: Secondary | ICD-10-CM | POA: Diagnosis not present

## 2018-07-07 DIAGNOSIS — H35372 Puckering of macula, left eye: Secondary | ICD-10-CM | POA: Diagnosis not present

## 2018-07-08 DIAGNOSIS — H2512 Age-related nuclear cataract, left eye: Secondary | ICD-10-CM | POA: Diagnosis not present

## 2018-07-08 DIAGNOSIS — H35372 Puckering of macula, left eye: Secondary | ICD-10-CM | POA: Diagnosis not present

## 2018-07-08 DIAGNOSIS — H43812 Vitreous degeneration, left eye: Secondary | ICD-10-CM | POA: Diagnosis not present

## 2018-07-08 DIAGNOSIS — H2511 Age-related nuclear cataract, right eye: Secondary | ICD-10-CM | POA: Diagnosis not present

## 2018-07-17 DIAGNOSIS — H2513 Age-related nuclear cataract, bilateral: Secondary | ICD-10-CM | POA: Diagnosis not present

## 2018-07-23 DIAGNOSIS — H2513 Age-related nuclear cataract, bilateral: Secondary | ICD-10-CM | POA: Diagnosis not present

## 2018-07-23 DIAGNOSIS — H25013 Cortical age-related cataract, bilateral: Secondary | ICD-10-CM | POA: Diagnosis not present

## 2018-07-30 DIAGNOSIS — H2511 Age-related nuclear cataract, right eye: Secondary | ICD-10-CM | POA: Diagnosis not present

## 2018-07-30 DIAGNOSIS — H2512 Age-related nuclear cataract, left eye: Secondary | ICD-10-CM | POA: Diagnosis not present

## 2018-08-06 DIAGNOSIS — H2511 Age-related nuclear cataract, right eye: Secondary | ICD-10-CM | POA: Diagnosis not present

## 2018-09-30 DIAGNOSIS — Z79899 Other long term (current) drug therapy: Secondary | ICD-10-CM | POA: Diagnosis not present

## 2018-09-30 DIAGNOSIS — H9190 Unspecified hearing loss, unspecified ear: Secondary | ICD-10-CM | POA: Diagnosis not present

## 2018-09-30 DIAGNOSIS — E039 Hypothyroidism, unspecified: Secondary | ICD-10-CM | POA: Diagnosis not present

## 2018-09-30 DIAGNOSIS — R7301 Impaired fasting glucose: Secondary | ICD-10-CM | POA: Diagnosis not present

## 2018-09-30 DIAGNOSIS — R7303 Prediabetes: Secondary | ICD-10-CM | POA: Diagnosis not present

## 2018-10-17 DIAGNOSIS — Z20828 Contact with and (suspected) exposure to other viral communicable diseases: Secondary | ICD-10-CM | POA: Diagnosis not present

## 2019-01-05 DIAGNOSIS — L309 Dermatitis, unspecified: Secondary | ICD-10-CM | POA: Diagnosis not present

## 2019-01-05 DIAGNOSIS — D229 Melanocytic nevi, unspecified: Secondary | ICD-10-CM | POA: Diagnosis not present

## 2019-01-05 DIAGNOSIS — L819 Disorder of pigmentation, unspecified: Secondary | ICD-10-CM | POA: Diagnosis not present

## 2019-01-05 DIAGNOSIS — L814 Other melanin hyperpigmentation: Secondary | ICD-10-CM | POA: Diagnosis not present

## 2019-01-05 DIAGNOSIS — Z85828 Personal history of other malignant neoplasm of skin: Secondary | ICD-10-CM | POA: Diagnosis not present

## 2019-01-05 DIAGNOSIS — L821 Other seborrheic keratosis: Secondary | ICD-10-CM | POA: Diagnosis not present

## 2019-01-29 DIAGNOSIS — Z961 Presence of intraocular lens: Secondary | ICD-10-CM | POA: Diagnosis not present

## 2019-01-29 DIAGNOSIS — Z8669 Personal history of other diseases of the nervous system and sense organs: Secondary | ICD-10-CM | POA: Diagnosis not present

## 2019-02-10 DIAGNOSIS — H35372 Puckering of macula, left eye: Secondary | ICD-10-CM | POA: Diagnosis not present

## 2019-02-10 DIAGNOSIS — Z961 Presence of intraocular lens: Secondary | ICD-10-CM | POA: Diagnosis not present

## 2019-02-10 DIAGNOSIS — H43811 Vitreous degeneration, right eye: Secondary | ICD-10-CM | POA: Diagnosis not present

## 2019-02-10 DIAGNOSIS — H43812 Vitreous degeneration, left eye: Secondary | ICD-10-CM | POA: Diagnosis not present

## 2019-05-26 DIAGNOSIS — C61 Malignant neoplasm of prostate: Secondary | ICD-10-CM | POA: Diagnosis not present

## 2019-06-30 DIAGNOSIS — H35372 Puckering of macula, left eye: Secondary | ICD-10-CM | POA: Diagnosis not present

## 2019-06-30 DIAGNOSIS — Z961 Presence of intraocular lens: Secondary | ICD-10-CM | POA: Diagnosis not present

## 2019-06-30 DIAGNOSIS — H43812 Vitreous degeneration, left eye: Secondary | ICD-10-CM | POA: Diagnosis not present

## 2019-06-30 DIAGNOSIS — H43811 Vitreous degeneration, right eye: Secondary | ICD-10-CM | POA: Diagnosis not present

## 2019-09-07 DIAGNOSIS — E7800 Pure hypercholesterolemia, unspecified: Secondary | ICD-10-CM | POA: Diagnosis not present

## 2019-09-07 DIAGNOSIS — E039 Hypothyroidism, unspecified: Secondary | ICD-10-CM | POA: Diagnosis not present

## 2019-09-07 DIAGNOSIS — H9190 Unspecified hearing loss, unspecified ear: Secondary | ICD-10-CM | POA: Diagnosis not present

## 2019-09-07 DIAGNOSIS — N529 Male erectile dysfunction, unspecified: Secondary | ICD-10-CM | POA: Diagnosis not present

## 2019-09-07 DIAGNOSIS — R7303 Prediabetes: Secondary | ICD-10-CM | POA: Diagnosis not present

## 2019-09-07 DIAGNOSIS — Z0000 Encounter for general adult medical examination without abnormal findings: Secondary | ICD-10-CM | POA: Diagnosis not present

## 2019-09-07 DIAGNOSIS — Z20828 Contact with and (suspected) exposure to other viral communicable diseases: Secondary | ICD-10-CM | POA: Diagnosis not present

## 2019-09-07 DIAGNOSIS — Z131 Encounter for screening for diabetes mellitus: Secondary | ICD-10-CM | POA: Diagnosis not present

## 2019-11-11 DIAGNOSIS — R7303 Prediabetes: Secondary | ICD-10-CM | POA: Diagnosis not present

## 2019-11-11 DIAGNOSIS — R634 Abnormal weight loss: Secondary | ICD-10-CM | POA: Diagnosis not present

## 2019-12-29 ENCOUNTER — Encounter (INDEPENDENT_AMBULATORY_CARE_PROVIDER_SITE_OTHER): Payer: Self-pay | Admitting: Ophthalmology

## 2020-01-04 DIAGNOSIS — D485 Neoplasm of uncertain behavior of skin: Secondary | ICD-10-CM | POA: Diagnosis not present

## 2020-01-04 DIAGNOSIS — D1801 Hemangioma of skin and subcutaneous tissue: Secondary | ICD-10-CM | POA: Diagnosis not present

## 2020-01-04 DIAGNOSIS — D229 Melanocytic nevi, unspecified: Secondary | ICD-10-CM | POA: Diagnosis not present

## 2020-01-04 DIAGNOSIS — C44319 Basal cell carcinoma of skin of other parts of face: Secondary | ICD-10-CM | POA: Diagnosis not present

## 2020-01-04 DIAGNOSIS — L819 Disorder of pigmentation, unspecified: Secondary | ICD-10-CM | POA: Diagnosis not present

## 2020-01-04 DIAGNOSIS — L821 Other seborrheic keratosis: Secondary | ICD-10-CM | POA: Diagnosis not present

## 2020-02-02 DIAGNOSIS — E119 Type 2 diabetes mellitus without complications: Secondary | ICD-10-CM | POA: Diagnosis not present

## 2020-02-02 DIAGNOSIS — Z961 Presence of intraocular lens: Secondary | ICD-10-CM | POA: Diagnosis not present

## 2020-02-08 DIAGNOSIS — C44319 Basal cell carcinoma of skin of other parts of face: Secondary | ICD-10-CM | POA: Diagnosis not present

## 2020-03-14 DIAGNOSIS — E039 Hypothyroidism, unspecified: Secondary | ICD-10-CM | POA: Diagnosis not present

## 2020-03-14 DIAGNOSIS — I4891 Unspecified atrial fibrillation: Secondary | ICD-10-CM | POA: Diagnosis not present

## 2020-03-14 DIAGNOSIS — D6869 Other thrombophilia: Secondary | ICD-10-CM | POA: Diagnosis not present

## 2020-03-14 DIAGNOSIS — R634 Abnormal weight loss: Secondary | ICD-10-CM | POA: Diagnosis not present

## 2020-03-14 DIAGNOSIS — I499 Cardiac arrhythmia, unspecified: Secondary | ICD-10-CM | POA: Diagnosis not present

## 2020-04-04 NOTE — Progress Notes (Signed)
Cardiology Office Note:    Date:  04/06/2020   ID:  Jermaine Soto, DOB [birth year], MRN [id number]  PCP:  Lawerance Cruel, MD  [place] Cardiologist:  Freada Bergeron, MD  [facility] Electrophysiologist:  None   Referring MD: Lawerance Cruel, MD    History of Present Illness:    Jermaine Soto is a 74 y.o. male with a hx of hypothyroidism who was referred to clinic by Dr. Harrington Challenger for evaluation of atrial fibrillation.   Patient states that he lost 40 lbs unintentionally since March in 2021 for unclear reasons. Last colonscopy 1 year ago with polyp with recommended repeat tudy in 3 years. He is s/p prostatectomy for concern for elevated PSA but no known diagnosis of prostate cancer. Notably TSH was low at 0.29 and synthroid has since been reduced which was possibly a source of his weight loss. At his last PCP visit, the patient was noted to be in new atrial fibrillation and was started on eliquis for [facility]. He presents to Cardiology clinic for further managment  Patient states he has been feeling off since March. Reports occasional palpitations and sensation of jitteriness. He is active at baseline and has no exertional symptoms, but has slowed down some over the past several months. Has noted increased fatigue since being out of rhythm. No nausea or vomiting. No orthopnea, LE edema. No melena or hematuria. No dizziness, lightheadedness or fainting.   Last TSH 0.29. TC 164, HDL 42, TG 159, LDL 94  Past Medical History:  Diagnosis Date  . Arthritis    hands   . Hypothyroidism     Past Surgical History:  Procedure Laterality Date  . ROBOT ASSISTED LAPAROSCOPIC RADICAL PROSTATECTOMY N/A 12/24/2012   Procedure: ROBOTIC ASSISTED LAPAROSCOPIC RADICAL PROSTATECTOMY;  Surgeon: Bernestine Amass, MD;  Location: [facility];  Service: Urology;  Laterality: N/A;    Current Medications: Current Meds  Medication Sig  . apixaban (ELIQUIS) 5 MG TABS tablet Take 5 mg by mouth 2 (two) times daily.   Marland Kitchen levothyroxine (SYNTHROID) 75 MCG tablet Take 75 mcg by mouth daily. Monday, Tuesday & Wednesday pt takes 1/2 tablet and full tablet on the rest of the week.     Allergies:   Patient has no known allergies.   Social History   Socioeconomic History  . Marital status: Married    Spouse name: Not on file  . Number of children: Not on file  . Years of education: Not on file  . Highest education level: Not on file  Occupational History  . Not on file  Tobacco Use  . Smoking status: Never Smoker  . Smokeless tobacco: Never Used  Substance and Sexual Activity  . Alcohol use: Yes    Comment: rare  . Drug use: No  . Sexual activity: Not on file  Other Topics Concern  . Not on file  Social History Narrative  . Not on file   Social Determinants of Health   Financial Resource Strain:   . Difficulty of Paying Living Expenses: Not on file  Food Insecurity:   . Worried About Charity fundraiser in the Last Year: Not on file  . Ran Out of Food in the Last Year: Not on file  Transportation Needs:   . Lack of Transportation (Medical): Not on file  . Lack of Transportation (Non-Medical): Not on file  Physical Activity:   . Days of Exercise per Week: Not on file  . Minutes of Exercise per Session:  Not on file  Stress:   . Feeling of Stress : Not on file  Social Connections:   . Frequency of Communication with Friends and Family: Not on file  . Frequency of Social Gatherings with Friends and Family: Not on file  . Attends Religious Services: Not on file  . Active Member of Clubs or Organizations: Not on file  . Attends Archivist Meetings: Not on file  . Marital Status: Not on file     Family History: The patient's family history is not on file.  ROS:   Please see the history of present illness.    Review of Systems  Constitutional: Positive for malaise/fatigue and weight loss. Negative for chills and fever.  HENT: Negative for sore throat.   Eyes: Negative for  blurred vision and double vision.  Respiratory: Negative for cough.   Cardiovascular: Positive for palpitations. Negative for chest pain, orthopnea, claudication, leg swelling and PND.  Gastrointestinal: Negative for abdominal pain, blood in stool, heartburn, melena, nausea and vomiting.  Genitourinary: Negative for dysuria.  Musculoskeletal: Negative for myalgias.  Skin: Negative for rash.  Neurological: Positive for headaches. Negative for dizziness and loss of consciousness.  Psychiatric/Behavioral: Negative for depression.    EKGs/Labs/Other Studies Reviewed:    The following studies were reviewed today: No cardiac studies in our system  EKG:  EKG is  ordered today.  The ekg ordered today demonstrates Afib with HR 96  Recent Labs: No results found for requested labs within last 8760 hours.  Recent Lipid Panel No results found for: CHOL, TRIG, HDL, CHOLHDL, VLDL, LDLCALC, LDLDIRECT   Risk Assessment/Calculations:    CHA2DS2-VASc Score = 1  This indicates a 0.6% annual risk of stroke. The patient's score is based upon: CHF History: 0 HTN History: 0 Diabetes History: 0 Stroke History: 0 Vascular Disease History: 0 Age Score: 1 Gender Score: 0       Physical Exam:    VS:  BP 120/80   Pulse 96   Ht 5\' 10"  (1.778 m)   Wt 186 lb (84.4 kg)   SpO2 98%   BMI 26.69 kg/m     Wt Readings from Last 3 Encounters:  04/06/20 186 lb (84.4 kg)  12/24/12 207 lb 10.8 oz (94.2 kg)  12/18/12 210 lb 3.2 oz (95.3 kg)     GEN:  Well nourished, well developed in no acute distress HEENT: Normal NECK: No JVD; No carotid bruits LYMPHATICS: No lymphadenopathy CARDIAC: Irregularly irregular, no murmurs, rubs, gallops RESPIRATORY:  Clear to auscultation without rales, wheezing or rhonchi  ABDOMEN: Soft, non-tender, non-distended MUSCULOSKELETAL:  No edema; No deformity  SKIN: Warm and dry NEUROLOGIC:  Alert and oriented x 3 PSYCHIATRIC:  Normal affect   ASSESSMENT:    1.  Atrial fibrillation, unspecified type ([place])   2. Weight loss    PLAN:    In order of problems listed above:  #Atrial fibrillation: CHADs-vasc 1 for age. Developed in the setting of elevated thyroid levels now on reduced dose of synthroidWanted to proceed with anticoagulation to reduce stroke risk as patient is pre-diabetic and has a positive family history for stroke in the family. Tolerating apixaban well with no bleeding. Not on BB. -Check TTE -On eliquis 5mg  BID for anticoagulation -TSH low at 0.29-->synthroid dose decreased-->will re-check today -Start metop tartrate 12.5mg  BID -Once thyroid controlled and on AC x3 weeks, can plan on DCCV if remains in Afib  #Weight loss: Patient with unintentional weight loss of 40lbs over the past  80months. Suspect secondary to hyperthyroidism given low TSH and stable weight since decreasing dose. Last colo 1 year ago with polyp and planned repeat in 3 years. Had prostatectomy for elevated PSA. Will treat underlying thyroid and if continues to lose weight, will refer back to GI for work-up as well as other cancer screening with PCP. -Repeat TSH as above and ensure thyroid dose is appropriate -If continues to lose weight, will need more thorough malignancy work-up   Medication Adjustments/Labs and Tests Ordered: Current medicines are reviewed at length with the patient today.  Concerns regarding medicines are outlined above.  Orders Placed This Encounter  Procedures  . TSH  . EKG 12-Lead  . ECHOCARDIOGRAM COMPLETE   Meds ordered this encounter  Medications  . metoprolol tartrate (LOPRESSOR) 25 MG tablet    Sig: Take 0.5 tablets (12.5 mg total) by mouth 2 (two) times daily.    Dispense:  90 tablet    Refill:  3    Patient Instructions  Medication Instructions:   1.Start metoprolol tartrate 12.5 mg by mouth twice daily  *If you need a refill on your cardiac medications before your next appointment, please call your pharmacy*   Lab  Work: TSH today If you have labs (blood work) drawn today and your tests are completely normal, you will receive your results only by: Marland Kitchen MyChart Message (if you have MyChart) OR . A paper copy in the mail If you have any lab test that is abnormal or we need to change your treatment, we will call you to review the results.   Testing/Procedures: Your physician has requested that you have an echocardiogram. Echocardiography is a painless test that uses sound waves to create images of your heart. It provides your doctor with information about the size and shape of your heart and how well your heart's chambers and valves are working. This procedure takes approximately one hour. There are no restrictions for this procedure.     Follow-Up: At [facility], you and your health needs are our priority.  As part of our continuing mission to provide you with exceptional heart care, we have created designated Provider Care Teams.  These Care Teams include your primary Cardiologist (physician) and Advanced Practice Providers (APPs -  Physician Assistants and Nurse Practitioners) who all work together to provide you with the care you need, when you need it.  We recommend signing up for the patient portal called "MyChart".  Sign up information is provided on this After Visit Summary.  MyChart is used to connect with patients for Virtual Visits (Telemedicine).  Patients are able to view lab/test results, encounter notes, upcoming appointments, etc.  Non-urgent messages can be sent to your provider as well.   To learn more about what you can do with MyChart, go to [email or web address].     Your next appointment:    You are scheduled to see Dr Johney Frame in 3 months on July 04, 2020 at 9:20 AM.     Signed, Freada Bergeron, MD  04/06/2020 10:00 AM    [place]

## 2020-04-06 ENCOUNTER — Telehealth: Payer: Self-pay

## 2020-04-06 ENCOUNTER — Other Ambulatory Visit: Payer: Self-pay

## 2020-04-06 ENCOUNTER — Ambulatory Visit (INDEPENDENT_AMBULATORY_CARE_PROVIDER_SITE_OTHER): Payer: Medicare Other | Admitting: Cardiology

## 2020-04-06 ENCOUNTER — Encounter: Payer: Self-pay | Admitting: Cardiology

## 2020-04-06 VITALS — BP 120/80 | HR 96 | Ht 70.0 in | Wt 186.0 lb

## 2020-04-06 DIAGNOSIS — R634 Abnormal weight loss: Secondary | ICD-10-CM | POA: Diagnosis not present

## 2020-04-06 DIAGNOSIS — I4891 Unspecified atrial fibrillation: Secondary | ICD-10-CM

## 2020-04-06 LAB — TSH: TSH: 0.088 u[IU]/mL — ABNORMAL LOW (ref 0.450–4.500)

## 2020-04-06 MED ORDER — METOPROLOL TARTRATE 25 MG PO TABS: 12.5000 mg | ORAL_TABLET | Freq: Two times a day (BID) | ORAL | 3 refills | Status: DC

## 2020-04-06 NOTE — Telephone Encounter (Signed)
**Note De-Identified Jermaine Soto Obfuscation** I called the pt and s/w him and his wife concerning Pt Asst through [employer] for his Eliquis. I gave them [employer] phone number and the pts wife states that she will call them now.  The pts wife also states that they do have a high deductible and is aware that that is why the pts Eliquis is so expensive.   She states that Dr Johney Frame advised her at the pt's office visit today that there is a Part D plan with better coverage that her dad currently has. She states that Dr Johney Frame advised them that she would contact them with the Part D information.  She is requesting a call back with that information.

## 2020-04-06 NOTE — Telephone Encounter (Signed)
**Note De-identified Vincenza Dail Obfuscation** -----  **Note De-Identified Ayane Delancey Obfuscation** Message from Rollen Sox, [facility] sent at 04/06/2020  9:22 AM EDT ----- Regarding: patient assistance eliquis [employer],  Can we see if we can get him set up with a patient assistance for Eliquis?  He reports his copays are ~500 dollars  Thanks!

## 2020-04-06 NOTE — Patient Instructions (Signed)
Medication Instructions:   1.Start metoprolol tartrate 12.5 mg by mouth twice daily  *If you need a refill on your cardiac medications before your next appointment, please call your pharmacy*   Lab Work: TSH today If you have labs (blood work) drawn today and your tests are completely normal, you will receive your results only by: Marland Kitchen MyChart Message (if you have MyChart) OR . A paper copy in the mail If you have any lab test that is abnormal or we need to change your treatment, we will call you to review the results.   Testing/Procedures: Your physician has requested that you have an echocardiogram. Echocardiography is a painless test that uses sound waves to create images of your heart. It provides your doctor with information about the size and shape of your heart and how well your heart's chambers and valves are working. This procedure takes approximately one hour. There are no restrictions for this procedure.     Follow-Up: At [facility], you and your health needs are our priority.  As part of our continuing mission to provide you with exceptional heart care, we have created designated Provider Care Teams.  These Care Teams include your primary Cardiologist (physician) and Advanced Practice Providers (APPs -  Physician Assistants and Nurse Practitioners) who all work together to provide you with the care you need, when you need it.  We recommend signing up for the patient portal called "MyChart".  Sign up information is provided on this After Visit Summary.  MyChart is used to connect with patients for Virtual Visits (Telemedicine).  Patients are able to view lab/test results, encounter notes, upcoming appointments, etc.  Non-urgent messages can be sent to your provider as well.   To learn more about what you can do with MyChart, go to [email or web address].     Your next appointment:    You are scheduled to see Dr Johney Frame in 3 months on July 04, 2020 at 9:20 AM.

## 2020-04-06 NOTE — Telephone Encounter (Signed)
**Note De-Identified Jyoti Harju Obfuscation** The pts wife states that she called [employer] and was advised that their income is more than the income limit for pt asst with them.  I did advise the pts wife to contact the pts plan to see if a better Part D coverage plan option is available for the pt. and if not to "shop around" with other ins providers to see if a a better plan is available with them also.  Also, the pt and his wife are aware that I am attempting a tier exception for his Eliquis through [employer] (his current ins plan).  Done through covermymeds: Key: [id number]

## 2020-04-07 ENCOUNTER — Other Ambulatory Visit: Payer: Self-pay

## 2020-04-07 MED ORDER — APIXABAN 5 MG PO TABS: 5.0000 mg | ORAL_TABLET | Freq: Two times a day (BID) | ORAL | 0 refills | Status: DC

## 2020-04-07 NOTE — Telephone Encounter (Signed)
**Note De-Identified Sunjai Levandoski Obfuscation** Per the pts wife's request I have sent the pts Eliquis RX to [facility] to fill for #30 with no refills. She is aware that we are leaving them the Eliquis free 30 day co-pay card for Eliquis along with 2 more boxes of samples for them to pick up at Dr Jacolyn Reedy office.  She thanked me for all of our attempts to try to help them avoid having to pay the ps deductible 2 X's "back to back". We did discuss them looking into a better Part D plan/coverage again and she states that she is currently doing so now.

## 2020-04-07 NOTE — Telephone Encounter (Addendum)
We gave patient 3 weeks of samples yesterday. I have activated them a free 30 day trial card and will give then 2 more weeks of samples. Patient will need to choose a plan with a low to no deductible next year or will need to change to warfarin. Both have been left at the front desk.

## 2020-04-07 NOTE — Telephone Encounter (Signed)
We will give them 6 weeks from our supply and will get more from the drug rep.

## 2020-04-07 NOTE — Telephone Encounter (Signed)
**Note De-Identified Roniya Tetro Obfuscation** Letter received from [facility] stating that they have denied the pts Eliquis tier exception. Reason: Eliquis is already at the lowest tier (3) possible as a name brand medication per Medicare part D   I called the pt but discussed with his wife (pt gave verbal permission). We discussed the pt switching to Xarelto as there is a program Dealer) that will cost him $85/30 day supply for Xarelto but she states that they are not interested in the pt switching even just until the new year.  She states that the pt has enough Eliquis to last him until 11/24 then they will have to pay a $500 deductible for a refill and then pay that amount again in January.  She is requesting 6 weeks of Eliquis samples from [place] to avoid having to pay that amount X 2.  I did advise her that our samples are limited and that 6 weeks would be hard for [place] to provide.  She is aware that I will call her back after I s/w my supervisor and pharmacy team concerning Eliquis samples.

## 2020-04-08 ENCOUNTER — Telehealth: Payer: Self-pay

## 2020-04-08 DIAGNOSIS — I4891 Unspecified atrial fibrillation: Secondary | ICD-10-CM

## 2020-04-08 MED ORDER — LEVOTHYROXINE SODIUM 75 MCG PO TABS: 37.5000 ug | ORAL_TABLET | Freq: Every day | ORAL | 3 refills | Status: DC

## 2020-04-08 NOTE — Telephone Encounter (Signed)
-----   Message from Freada Bergeron, MD sent at 04/07/2020  8:03 PM EDT ----- TSH is low which means his synthroid dose is still too high. Let's change him to a half a pill daily (37.23mcg) and repeat labs in 6 weeks.

## 2020-04-08 NOTE — Telephone Encounter (Signed)
The patient has been notified of the result and verbalized understanding.  All questions (if any) were answered. Antonieta Iba, RN 04/08/2020 8:34 AM

## 2020-04-28 ENCOUNTER — Ambulatory Visit (HOSPITAL_COMMUNITY): Payer: Medicare Other | Attending: Cardiovascular Disease

## 2020-04-28 ENCOUNTER — Other Ambulatory Visit: Payer: Self-pay

## 2020-04-28 ENCOUNTER — Telehealth: Payer: Self-pay | Admitting: Cardiology

## 2020-04-28 DIAGNOSIS — I4891 Unspecified atrial fibrillation: Secondary | ICD-10-CM | POA: Diagnosis not present

## 2020-04-28 LAB — ECHOCARDIOGRAM COMPLETE
Area-P 1/2: 4.07 cm2
S' Lateral: 3.3 cm

## 2020-04-28 NOTE — Telephone Encounter (Signed)
Patient would like a call back regarding his medications.  He began having headaches since starting Metoprolol on 04/06/2020.  He's not sure if that's it or not.

## 2020-04-28 NOTE — Telephone Encounter (Signed)
Reviewed with pharmacist and headaches are not a common side effect of lopressor.  I spoke with patient and gave him this information. Patient reports headaches that occur usually everyday.  Thinks they started after he began taking lopressor.  He will relax and it will sometimes go away.  Has not taken anything for the headache.  Has BP cuff but has not been checking BP.  He does have a headache now and BP was 120/80 when he had echo this morning.  I told patient he could try tylenol. I asked him to check BP and heart rate for a few days and let us know the readings.

## 2020-05-12 ENCOUNTER — Telehealth: Payer: Self-pay | Admitting: Cardiology

## 2020-05-12 NOTE — Telephone Encounter (Signed)
Follow Up:    Pt said he received a call from this office today, but he did not know who called.

## 2020-05-12 NOTE — Telephone Encounter (Signed)
Spoke with the patient and his wife. I do not see note anywhere of who tried to call the patient. Patient has an appointment with Dr. Johney Frame on 12/9 and is aware.

## 2020-05-17 NOTE — Progress Notes (Signed)
Cardiology Office Note:    Date:  05/19/2020   ID:  Jermaine Soto, DOB 21-Jul-1945, MRN [id number]  PCP:  Lawerance Cruel, MD  [place] Cardiologist:  Freada Bergeron, MD  [facility] Electrophysiologist:  None   Referring MD: Lawerance Cruel, MD    History of Present Illness:    Jermaine Soto is a 74 y.o. male with a hx of hypothyroidism and newly diagnosed Afib who returns to clinic for follow-up.  During our last visit on 10/27, the patient was seen after he was noted to be in Afib in the office with his PCP. He was started on apixaban for [facility]. He was also notably losing significant amount of weight in the setting of hyperthyroidism (TSH 0.088) from supratherapeutic doses of synthroid. We decreased his synthroid to 37.55mcg at that time. TTE showed LVEF 50-55%, no WMA, mild RAE, normal LA, no significant valvular disease.   Today, the patient states that he is continues have daily HA and episodes of low blood pressure at home. He is symptomatic and feels dizzy when his blood pressure is in the 90s. HR has been well controlled with HR mainly in the 60s. No palpitations, chest pain or SOB. He is having insomnia at night and his hoping for a sleep aide. He feels fatigued during the daytime and needs to take naps. He attributes this to not sleeping well at night. Continues to lose some weight (weight down to 178 from 186lbs).   Past Medical History:  Diagnosis Date  . Arthritis    hands   . Hypothyroidism     Past Surgical History:  Procedure Laterality Date  . ROBOT ASSISTED LAPAROSCOPIC RADICAL PROSTATECTOMY N/A 12/24/2012   Procedure: ROBOTIC ASSISTED LAPAROSCOPIC RADICAL PROSTATECTOMY;  Surgeon: Bernestine Amass, MD;  Location: [facility];  Service: Urology;  Laterality: N/A;    Current Medications: Current Meds  Medication Sig  . apixaban (ELIQUIS) 5 MG TABS tablet Take 1 tablet (5 mg total) by mouth 2 (two) times daily.  . Cholecalciferol (VITAMIN D3) 125 MCG (5000 UT)  CAPS Take 1 tablet by mouth daily.  Marland Kitchen levothyroxine (SYNTHROID) 75 MCG tablet Take 0.5 tablets (37.5 mcg total) by mouth daily before breakfast.  . metoprolol tartrate (LOPRESSOR) 25 MG tablet Take 0.5 tablets (12.5 mg total) by mouth 2 (two) times daily.     Allergies:   Patient has no known allergies.   Social History   Socioeconomic History  . Marital status: Married    Spouse name: Not on file  . Number of children: Not on file  . Years of education: Not on file  . Highest education level: Not on file  Occupational History  . Not on file  Tobacco Use  . Smoking status: Never Smoker  . Smokeless tobacco: Never Used  Substance and Sexual Activity  . Alcohol use: Yes    Comment: rare  . Drug use: No  . Sexual activity: Not on file  Other Topics Concern  . Not on file  Social History Narrative  . Not on file   Social Determinants of Health   Financial Resource Strain: Not on file  Food Insecurity: Not on file  Transportation Needs: Not on file  Physical Activity: Not on file  Stress: Not on file  Social Connections: Not on file     Family History: The patient's family history is not on file.  ROS:   Please see the history of present illness.    Review of Systems  Constitutional: Positive for malaise/fatigue and weight loss.  HENT: Positive for nosebleeds.   Eyes: Negative for pain.  Respiratory: Negative for shortness of breath.   Cardiovascular: Negative for chest pain, palpitations, orthopnea, claudication, leg swelling and PND.  Gastrointestinal: Negative for heartburn, nausea and vomiting.  Genitourinary: Negative for hematuria.  Musculoskeletal: Positive for myalgias.  Neurological: Positive for dizziness. Negative for loss of consciousness.  Endo/Heme/Allergies: Negative for polydipsia.  Psychiatric/Behavioral: Negative for substance abuse. The patient has insomnia.     EKGs/Labs/Other Studies Reviewed:    The following studies were reviewed  today: TTE 04/28/20: IMPRESSIONS  1. Left ventricular ejection fraction, by estimation, is 50 to 55%. The  left ventricle has low normal function. The left ventricle has no regional  wall motion abnormalities. Left ventricular diastolic function could not  be evaluated.  2. Right ventricular systolic function is normal. The right ventricular  size is normal. There is normal pulmonary artery systolic pressure.  3. Right atrial size was mildly dilated.  4. The mitral valve is normal in structure. Mild mitral valve  regurgitation. No evidence of mitral stenosis.  5. The aortic valve is normal in structure. Aortic valve regurgitation is  not visualized. No aortic stenosis is present.   EKG:  EKG is ordered today.  The ekg ordered today demonstrates NSR with HR 64; LAE  Recent Labs: 04/06/2020: TSH 0.088  Recent Lipid Panel No results found for: CHOL, TRIG, HDL, CHOLHDL, VLDL, LDLCALC, LDLDIRECT   Risk Assessment/Calculations:    CHA2DS2-VASc Score = 1  This indicates a 0.6% annual risk of stroke. The patient's score is based upon: CHF History: No HTN History: No Diabetes History: No Stroke History: No Vascular Disease History: No Age Score: 1 Gender Score: 0      Physical Exam:    VS:  BP 98/66   Pulse 64   Ht 5\' 10"  (1.778 m)   Wt 178 lb 9.6 oz (81 kg)   SpO2 98%   BMI 25.63 kg/m     Wt Readings from Last 3 Encounters:  05/19/20 178 lb 9.6 oz (81 kg)  04/06/20 186 lb (84.4 kg)  12/24/12 207 lb 10.8 oz (94.2 kg)     GEN:  Well nourished, well developed in no acute distress HEENT: Normal NECK: No JVD; No carotid bruits. Left lobe of thyroid feels enlarged CARDIAC: RRR, no murmurs, rubs, gallops RESPIRATORY:  Clear to auscultation without rales, wheezing or rhonchi  ABDOMEN: Soft, non-tender, non-distended MUSCULOSKELETAL:  No edema; No deformity  SKIN: Warm and dry NEUROLOGIC:  Alert and oriented x 3 PSYCHIATRIC:  Normal affect   ASSESSMENT:    1.  Atrial fibrillation, unspecified type ([place])   2. Thyroid disease   3. Medication management   4. Weight loss    PLAN:    In order of problems listed above:  #Atrial fibrillation: CHADs-vasc 1 for age. Developed in the setting of elevated thyroid levels. Wanted to proceed with anticoagulation to reduce stroke risk as patient is pre-diabetic and has a positive family history for stroke in the family. Tolerating apixaban well with no bleeding. Not on BB. Has converted back to NSR today. -Continue eliquis 5mg  BID for anticoagulation -Hold metoprolol as having episodes of hypotension; can take prn for tachycardia or elevated Bps at home -TTE with LVEF 50-55%, no regional WMA   #Iatrogenic hyperthyroidism #History of hypothyroidism #Enlarged thyroid gland: #Weight loss: Patient with unintentional weight loss of 40lbs over the past 74months. Suspect secondary to hyperthyroidism given low TSH  and stable weight since decreasing dose. Last colo 1 year ago with polyp and planned repeat in 3 years. Had prostatectomy for elevated PSA. Will treat underlying thyroid and if continues to lose weight, will refer back to GI for work-up as well as other cancer screening with PCP. -Refer to Endocrinology for management of thyroid disease -Will repeat TSH, free T4 and T3 today -Will need ultrasound of his thyroid as I do feel a nodule on exam   Medication Adjustments/Labs and Tests Ordered: Current medicines are reviewed at length with the patient today.  Concerns regarding medicines are outlined above.  Orders Placed This Encounter  Procedures  . TSH  . T3, free  . T4, free  . Ambulatory referral to Endocrinology  . EKG 12-Lead   No orders of the defined types were placed in this encounter.   Patient Instructions  Medication Instructions:  Your physician recommends that you continue on your current medications as directed. Please refer to the Current Medication list given to you today.  *If you  need a refill on your cardiac medications before your next appointment, please call your pharmacy*   Lab Work: Lab work to be done today--TSH, [employer], Free T4 If you have labs (blood work) drawn today and your tests are completely normal, you will receive your results only by: Marland Kitchen MyChart Message (if you have MyChart) OR . A paper copy in the mail If you have any lab test that is abnormal or we need to change your treatment, we will call you to review the results.   Testing/Procedures: none   Follow-Up: At [facility], you and your health needs are our priority.  As part of our continuing mission to provide you with exceptional heart care, we have created designated Provider Care Teams.  These Care Teams include your primary Cardiologist (physician) and Advanced Practice Providers (APPs -  Physician Assistants and Nurse Practitioners) who all work together to provide you with the care you need, when you need it.  We recommend signing up for the patient portal called "MyChart".  Sign up information is provided on this After Visit Summary.  MyChart is used to connect with patients for Virtual Visits (Telemedicine).  Patients are able to view lab/test results, encounter notes, upcoming appointments, etc.  Non-urgent messages can be sent to your provider as well.   To learn more about what you can do with MyChart, go to [email or web address].    Your next appointment:   January 13,2022 at 8:20  The format for your next appointment:   In Person  Provider:   Dr Johney Frame    Other Instructions   You have been referred to [facility] Endocrinology      Signed, Freada Bergeron, MD  05/19/2020 12:05 PM    [place]

## 2020-05-19 ENCOUNTER — Ambulatory Visit (INDEPENDENT_AMBULATORY_CARE_PROVIDER_SITE_OTHER): Payer: Medicare Other | Admitting: Cardiology

## 2020-05-19 ENCOUNTER — Encounter: Payer: Self-pay | Admitting: Cardiology

## 2020-05-19 ENCOUNTER — Other Ambulatory Visit: Payer: Self-pay

## 2020-05-19 VITALS — BP 98/66 | HR 64 | Ht 70.0 in | Wt 178.6 lb

## 2020-05-19 DIAGNOSIS — Z79899 Other long term (current) drug therapy: Secondary | ICD-10-CM | POA: Diagnosis not present

## 2020-05-19 DIAGNOSIS — E079 Disorder of thyroid, unspecified: Secondary | ICD-10-CM | POA: Diagnosis not present

## 2020-05-19 DIAGNOSIS — R634 Abnormal weight loss: Secondary | ICD-10-CM

## 2020-05-19 DIAGNOSIS — I4891 Unspecified atrial fibrillation: Secondary | ICD-10-CM | POA: Diagnosis not present

## 2020-05-19 NOTE — Patient Instructions (Signed)
Medication Instructions:  Your physician recommends that you continue on your current medications as directed. Please refer to the Current Medication list given to you today.  *If you need a refill on your cardiac medications before your next appointment, please call your pharmacy*   Lab Work: Lab work to be done today--TSH, [employer], Free T4 If you have labs (blood work) drawn today and your tests are completely normal, you will receive your results only by: Marland Kitchen MyChart Message (if you have MyChart) OR . A paper copy in the mail If you have any lab test that is abnormal or we need to change your treatment, we will call you to review the results.   Testing/Procedures: none   Follow-Up: At [facility], you and your health needs are our priority.  As part of our continuing mission to provide you with exceptional heart care, we have created designated Provider Care Teams.  These Care Teams include your primary Cardiologist (physician) and Advanced Practice Providers (APPs -  Physician Assistants and Nurse Practitioners) who all work together to provide you with the care you need, when you need it.  We recommend signing up for the patient portal called "MyChart".  Sign up information is provided on this After Visit Summary.  MyChart is used to connect with patients for Virtual Visits (Telemedicine).  Patients are able to view lab/test results, encounter notes, upcoming appointments, etc.  Non-urgent messages can be sent to your provider as well.   To learn more about what you can do with MyChart, go to [email or web address].    Your next appointment:   January 13,2022 at 8:20  The format for your next appointment:   In Person  Provider:   Dr Johney Frame    Other Instructions   You have been referred to [facility] Endocrinology

## 2020-05-20 LAB — T3, FREE: T3, Free: 1.7 pg/mL — ABNORMAL LOW (ref 2.0–4.4)

## 2020-05-20 LAB — TSH: TSH: 0.744 u[IU]/mL (ref 0.450–4.500)

## 2020-05-20 LAB — T4, FREE: Free T4: 0.47 ng/dL — ABNORMAL LOW (ref 0.82–1.77)

## 2020-05-23 ENCOUNTER — Telehealth: Payer: Self-pay

## 2020-05-23 NOTE — Telephone Encounter (Signed)
The patient has been notified of the result and verbalized understanding.  All questions (if any) were answered. Wilma Flavin, RN 05/23/2020 9:24 AM

## 2020-05-23 NOTE — Telephone Encounter (Signed)
-----   Message from Freada Bergeron, MD sent at 05/21/2020  4:03 PM EST ----- His TSH level is so much better! His thyroid hormone is actually a little low. This may be making him feel more tired than normal. Can we adjust his medication such that he takes 37.46mcg 5x/week (M, W, F, Sat, Sun) and take 52mcg on Tuesday and Thursday. This will help increase his thyroid levels a little bit while we await his appointment with Endocrinology.

## 2020-05-26 DIAGNOSIS — C61 Malignant neoplasm of prostate: Secondary | ICD-10-CM | POA: Diagnosis not present

## 2020-05-29 DIAGNOSIS — E039 Hypothyroidism, unspecified: Secondary | ICD-10-CM | POA: Diagnosis not present

## 2020-05-29 DIAGNOSIS — Z8546 Personal history of malignant neoplasm of prostate: Secondary | ICD-10-CM | POA: Diagnosis not present

## 2020-05-29 DIAGNOSIS — I4891 Unspecified atrial fibrillation: Secondary | ICD-10-CM | POA: Diagnosis not present

## 2020-05-29 DIAGNOSIS — E7800 Pure hypercholesterolemia, unspecified: Secondary | ICD-10-CM | POA: Diagnosis not present

## 2020-05-30 ENCOUNTER — Other Ambulatory Visit: Payer: Medicare Other

## 2020-06-01 ENCOUNTER — Other Ambulatory Visit: Payer: Self-pay

## 2020-06-01 ENCOUNTER — Ambulatory Visit (INDEPENDENT_AMBULATORY_CARE_PROVIDER_SITE_OTHER): Payer: Medicare Other | Admitting: Family

## 2020-06-01 ENCOUNTER — Encounter (HOSPITAL_COMMUNITY): Payer: Self-pay | Admitting: Emergency Medicine

## 2020-06-01 ENCOUNTER — Ambulatory Visit (INDEPENDENT_AMBULATORY_CARE_PROVIDER_SITE_OTHER): Payer: Medicare Other

## 2020-06-01 ENCOUNTER — Encounter: Payer: Self-pay | Admitting: Family

## 2020-06-01 ENCOUNTER — Emergency Department (HOSPITAL_COMMUNITY)
Admission: EM | Admit: 2020-06-01 | Discharge: 2020-06-01 | Disposition: A | Discharge status: Home / Self Care | Payer: Medicare Other | Attending: Emergency Medicine | Admitting: Emergency Medicine

## 2020-06-01 VITALS — BP 100/70 | HR 83 | Temp 98.2°F | Ht 70.0 in | Wt 176.0 lb

## 2020-06-01 DIAGNOSIS — E039 Hypothyroidism, unspecified: Secondary | ICD-10-CM | POA: Diagnosis not present

## 2020-06-01 DIAGNOSIS — Z7901 Long term (current) use of anticoagulants: Secondary | ICD-10-CM | POA: Insufficient documentation

## 2020-06-01 DIAGNOSIS — Z79899 Other long term (current) drug therapy: Secondary | ICD-10-CM | POA: Insufficient documentation

## 2020-06-01 DIAGNOSIS — R634 Abnormal weight loss: Secondary | ICD-10-CM | POA: Diagnosis not present

## 2020-06-01 DIAGNOSIS — E871 Hypo-osmolality and hyponatremia: Secondary | ICD-10-CM | POA: Insufficient documentation

## 2020-06-01 DIAGNOSIS — R799 Abnormal finding of blood chemistry, unspecified: Secondary | ICD-10-CM | POA: Diagnosis present

## 2020-06-01 DIAGNOSIS — I4891 Unspecified atrial fibrillation: Secondary | ICD-10-CM | POA: Diagnosis not present

## 2020-06-01 DIAGNOSIS — E049 Nontoxic goiter, unspecified: Secondary | ICD-10-CM | POA: Diagnosis not present

## 2020-06-01 LAB — CBC WITH DIFFERENTIAL/PLATELET
Basophils Absolute: 0.1 10*3/uL (ref 0.0–0.1)
Basophils Relative: 1 % (ref 0.0–3.0)
Eosinophils Absolute: 0.2 10*3/uL (ref 0.0–0.7)
Eosinophils Relative: 3.6 % (ref 0.0–5.0)
HCT: 42.7 % (ref 39.0–52.0)
Hemoglobin: 14.6 g/dL (ref 13.0–17.0)
Lymphocytes Relative: 41.6 % (ref 12.0–46.0)
Lymphs Abs: 2.8 10*3/uL (ref 0.7–4.0)
MCHC: 34.1 g/dL (ref 30.0–36.0)
MCV: 87.6 fl (ref 78.0–100.0)
Monocytes Absolute: 0.5 10*3/uL (ref 0.1–1.0)
Monocytes Relative: 8 % (ref 3.0–12.0)
Neutro Abs: 3.1 10*3/uL (ref 1.4–7.7)
Neutrophils Relative %: 45.8 % (ref 43.0–77.0)
Platelets: 257 10*3/uL (ref 150.0–400.0)
RBC: 4.88 Mil/uL (ref 4.22–5.81)
RDW: 14.2 % (ref 11.5–15.5)
WBC: 6.7 10*3/uL (ref 4.0–10.5)

## 2020-06-01 LAB — COMPREHENSIVE METABOLIC PANEL
ALT: 10 U/L (ref 0–53)
ALT: 12 U/L (ref 0–44)
AST: 19 U/L (ref 0–37)
AST: 21 U/L (ref 15–41)
Albumin: 4.4 g/dL (ref 3.5–5.2)
Albumin: 4.4 g/dL (ref 3.5–5.0)
Alkaline Phosphatase: 42 U/L (ref 39–117)
Alkaline Phosphatase: 42 U/L (ref 38–126)
Anion gap: 10 (ref 5–15)
BUN: 19 mg/dL (ref 6–23)
BUN: 21 mg/dL (ref 8–23)
CO2: 29 mEq/L (ref 19–32)
CO2: 23 mmol/L (ref 22–32)
Calcium: 9.7 mg/dL (ref 8.4–10.5)
Calcium: 9.2 mg/dL (ref 8.9–10.3)
Chloride: 92 mEq/L — ABNORMAL LOW (ref 96–112)
Chloride: 94 mmol/L — ABNORMAL LOW (ref 98–111)
Creatinine, Ser: 1.09 mg/dL (ref 0.40–1.50)
Creatinine, Ser: 0.95 mg/dL (ref 0.61–1.24)
GFR, Estimated: >60 mL/min (ref >60)
GFR: 66.88 mL/min (ref >60.00)
Glucose, Bld: 100 mg/dL — ABNORMAL HIGH (ref 70–99)
Glucose, Bld: 98 mg/dL (ref 70–99)
Potassium: 4.4 mEq/L (ref 3.5–5.1)
Potassium: 4.1 mmol/L (ref 3.5–5.1)
Sodium: 125 mEq/L — ABNORMAL LOW (ref 135–145)
Sodium: 127 mmol/L — ABNORMAL LOW (ref 135–145)
Total Bilirubin: 0.5 mg/dL (ref 0.2–1.2)
Total Bilirubin: 0.5 mg/dL (ref 0.3–1.2)
Total Protein: 7.5 g/dL (ref 6.0–8.3)
Total Protein: 7.4 g/dL (ref 6.5–8.1)

## 2020-06-01 LAB — CBC
HCT: 40.8 % (ref 39.0–52.0)
Hemoglobin: 14.2 g/dL (ref 13.0–17.0)
MCH: 30.1 pg (ref 26.0–34.0)
MCHC: 34.8 g/dL (ref 30.0–36.0)
MCV: 86.6 fL (ref 80.0–100.0)
Platelets: 235 10*3/uL (ref 150–400)
RBC: 4.71 MIL/uL (ref 4.22–5.81)
RDW: 13.2 % (ref 11.5–15.5)
WBC: 7.7 10*3/uL (ref 4.0–10.5)
nRBC: 0 % (ref 0.0–0.2)

## 2020-06-01 LAB — URINALYSIS, ROUTINE W REFLEX MICROSCOPIC
Bilirubin Urine: NEGATIVE
Glucose, UA: NEGATIVE mg/dL
Hgb urine dipstick: NEGATIVE
Ketones, ur: NEGATIVE mg/dL
Leukocytes,Ua: NEGATIVE
Nitrite: NEGATIVE
Protein, ur: NEGATIVE mg/dL
Specific Gravity, Urine: 1.02 (ref 1.005–1.030)
pH: 5 (ref 5.0–8.0)

## 2020-06-01 LAB — T4, FREE: Free T4: 0.57 ng/dL — ABNORMAL LOW (ref 0.61–1.12)

## 2020-06-01 LAB — LIPASE: Lipase: 39 U/L (ref 11.0–59.0)

## 2020-06-01 LAB — TSH: TSH: 1.007 u[IU]/mL (ref 0.350–4.500)

## 2020-06-01 LAB — AMYLASE: Amylase: 29 U/L (ref 27–131)

## 2020-06-01 MED ORDER — SODIUM CHLORIDE 0.9 % IV BOLUS
1000.0000 mL | Freq: Once | INTRAVENOUS | Status: AC
  Administered 2020-06-01: 1000 mL via INTRAVENOUS

## 2020-06-01 NOTE — Progress Notes (Signed)
Jermaine Soto is a 74 y.o. male with the following history as recorded in EpicCare:  There are no problems to display for this patient.   Current Outpatient Medications  Medication Sig Dispense Refill  . apixaban (ELIQUIS) 5 MG TABS tablet Take 1 tablet (5 mg total) by mouth 2 (two) times daily. 60 tablet 0  . Cholecalciferol (VITAMIN D3) 125 MCG (5000 UT) CAPS Take 1 tablet by mouth daily.    Marland Kitchen levothyroxine (SYNTHROID) 75 MCG tablet Take 0.5 tablets (37.5 mcg total) by mouth daily before breakfast. 45 tablet 3  . metoprolol tartrate (LOPRESSOR) 25 MG tablet Take 0.5 tablets (12.5 mg total) by mouth 2 (two) times daily. (Patient not taking: Reported on 06/01/2020) 90 tablet 3   No current facility-administered medications for this visit.    Allergies: Patient has no known allergies.  Past Medical History:  Diagnosis Date  . Arthritis    hands   . Hypothyroidism     Past Surgical History:  Procedure Laterality Date  . ROBOT ASSISTED LAPAROSCOPIC RADICAL PROSTATECTOMY N/A 12/24/2012   Procedure: ROBOTIC ASSISTED LAPAROSCOPIC RADICAL PROSTATECTOMY;  Surgeon: Bernestine Amass, MD;  Location: [facility];  Service: Urology;  Laterality: N/A;    History reviewed. No pertinent family history.  Social History   Tobacco Use  . Smoking status: Never Smoker  . Smokeless tobacco: Never Used  Substance Use Topics  . Alcohol use: Yes    Comment: rare    Subjective:  Patient presents today as a new patient; accompanied by his wife today- has apparently lost at least 44 pounds since March 2021; during the year, has been found to be hyperthyroid and having A. Fib; cardiologist is confident that A. Fib onset was related to hyperthyroidism.   Patient mentions "lump" in neck that has been present for the past year-has not discussed with previous PCP; feels that has been getting bigger; is up to date on colonoscopy; denies any fever, night sweats;   Last colonoscopy done in 2019- large polyp noted; due  for repeat colonoscopy in 2022;     Objective:  Vitals:   06/01/20 0909  BP: 100/70  Pulse: 83  Temp: 98.2 F (36.8 C)  TempSrc: Oral  SpO2: 99%  Weight: 176 lb (79.8 kg)  Height: 5' 10"  (1.778 m)    General: Well developed, well nourished, in no acute distress  Skin : Warm and dry.  Head: Normocephalic and atraumatic  Eyes: Sclera and conjunctiva clear; pupils round and reactive to light; extraocular movements intact  Ears: External normal; canals clear; tympanic membranes normal  Oropharynx: Pink, supple. No suspicious lesions  Neck: Supple with thyromegaly, no adenopathy  Lungs: Respirations unlabored; clear to auscultation bilaterally without wheeze, rales, rhonchi  CVS exam: normal rate and regular rhythm.  Neurologic: Alert and oriented; speech intact; face symmetrical; moves all extremities well; CNII-XII intact without focal deficit   Assessment:  1. Weight loss   2. Enlarged thyroid gland   3. Hypothyroidism, unspecified type   4. Atrial fibrillation, unspecified type ([place])     Plan:  1. Patient has unintentionally lost 40 pounds in the past 9 month; will update CXR and labs today; will also need to consider abd/ pelvic CT- follow up to be determined; has had prostate removed due to elevated PSA;  2. Update thyroid ultrasound; 3. Keep planned follow-up with endocrine for February 2022; 4. Now in normal rhythm; continue regular follow-up with cardiology as scheduled;  Time spent 30 minutes reviewing notes/ discussing necessary  treatment plans  No follow-ups on file.  Orders Placed This Encounter  Procedures  . DG Chest 2 View    Standing Status:   Future    Number of Occurrences:   1    Standing Expiration Date:   06/01/2021    Order Specific Question:   Reason for Exam (SYMPTOM  OR DIAGNOSIS REQUIRED)    Answer:   weight loss    Order Specific Question:   Preferred imaging location?    Answer:   [facility]  . US THYROID    Standing Status:    Future    Standing Expiration Date:   06/01/2021    Order Specific Question:   Reason for Exam (SYMPTOM  OR DIAGNOSIS REQUIRED)    Answer:   enlarged thyroid    Order Specific Question:   Preferred imaging location?    Answer:   [facility]  . CBC with Differential/Platelet    Standing Status:   Future    Number of Occurrences:   1    Standing Expiration Date:   06/01/2021  . Comp Met (CMET)    Standing Status:   Future    Number of Occurrences:   1    Standing Expiration Date:   06/01/2021  . Amylase    Standing Status:   Future    Number of Occurrences:   1    Standing Expiration Date:   06/01/2021  . Lipase    Standing Status:   Future    Number of Occurrences:   1    Standing Expiration Date:   06/01/2021    Requested Prescriptions    No prescriptions requested or ordered in this encounter

## 2020-06-01 NOTE — ED Provider Notes (Signed)
[facility] DEPT Provider Note   CSN: [id number] Arrival date & time: 06/01/20  1553     History Chief Complaint  Patient presents with  . Abnormal Lab    Jermaine Soto is a 74 y.o. male past medical history of hypothyroidism, atrial fibrillation, presenting to the emergency department with complaint of low sodium level found by PCP today.  Patient states he had labs drawn by PCP today which were noted to be abnormal with a low sodium level.  He states he may have had this happen in the past though nothing recently.  He states over the last year he initially had some intentional weight loss and then it became unintentional.  It was found that his hypothyroid was not being well controlled.  He recently had some medication adjustments over the last few weeks and he is beginning to see improvement in symptoms.  He denies any excessive thirst, polyuria, abdominal pain, vomiting, diarrhea, dietary changes.  He endorses normal p.o. intake, states he is asymptomatic. Per chart review, patient sodium level today is 125 outpatient.  Blood glucose level was normal.  The history is provided by the patient and medical records.       Past Medical History:  Diagnosis Date  . Arthritis    hands   . Hypothyroidism     There are no problems to display for this patient.   Past Surgical History:  Procedure Laterality Date  . ROBOT ASSISTED LAPAROSCOPIC RADICAL PROSTATECTOMY N/A 12/24/2012   Procedure: ROBOTIC ASSISTED LAPAROSCOPIC RADICAL PROSTATECTOMY;  Surgeon: Bernestine Amass, MD;  Location: [facility];  Service: Urology;  Laterality: N/A;       No family history on file.  Social History   Tobacco Use  . Smoking status: Never Smoker  . Smokeless tobacco: Never Used  Substance Use Topics  . Alcohol use: Yes    Comment: rare  . Drug use: No    Home Medications Prior to Admission medications   Medication Sig Start Date End Date Taking? Authorizing Provider   acetaminophen (TYLENOL) 325 MG tablet Take 650 mg by mouth every 6 (six) hours as needed for mild pain, fever or headache.   Yes [provider]  apixaban (ELIQUIS) 5 MG TABS tablet Take 1 tablet (5 mg total) by mouth 2 (two) times daily. 04/07/20  Yes Freada Bergeron, MD  Cholecalciferol (VITAMIN D3) 125 MCG (5000 UT) CAPS Take 5,000 Units by mouth daily.   Yes [provider]  diphenhydrAMINE HCl (ZZZQUIL) 50 MG/30ML LIQD Take 30 mLs by mouth as needed (sleep).   Yes [provider]  levothyroxine (SYNTHROID) 75 MCG tablet Take 0.5 tablets (37.5 mcg total) by mouth daily before breakfast. Patient taking differently: Take 37.5-75 mcg by mouth See admin instructions. 44mcg on Tuesday and Thursday 37.67mcg every other day 04/08/20  Yes Freada Bergeron, MD  metoprolol tartrate (LOPRESSOR) 25 MG tablet Take 0.5 tablets (12.5 mg total) by mouth 2 (two) times daily. Patient not taking: No sig reported 04/06/20   Freada Bergeron, MD    Allergies    Patient has no known allergies.  Review of Systems   Review of Systems  All other systems reviewed and are negative.   Physical Exam Updated Vital Signs BP (!) 147/78   Pulse 64   Temp 98.1 F (36.7 C) (Oral)   Resp 18   Ht 5\' 10"  (1.778 m)   SpO2 100%   BMI 25.25 kg/m   Physical Exam Vitals  and nursing note reviewed.  Constitutional:      General: He is not in acute distress.    Appearance: He is well-developed and well-nourished.  HENT:     Head: Normocephalic and atraumatic.  Eyes:     Conjunctiva/sclera: Conjunctivae normal.  Cardiovascular:     Rate and Rhythm: Normal rate and regular rhythm.  Pulmonary:     Effort: Pulmonary effort is normal. No respiratory distress.     Breath sounds: Normal breath sounds.  Abdominal:     General: Bowel sounds are normal.     Palpations: Abdomen is soft.     Tenderness: There is no abdominal tenderness. There is no guarding or rebound.   Musculoskeletal:     Right lower leg: No edema.     Left lower leg: No edema.  Skin:    General: Skin is warm.  Neurological:     Mental Status: He is alert.  Psychiatric:        Mood and Affect: Mood and affect normal.        Behavior: Behavior normal.     ED Results / Procedures / Treatments   Labs (all labs ordered are listed, but only abnormal results are displayed) Labs Reviewed  COMPREHENSIVE METABOLIC PANEL - Abnormal; Notable for the following components:      Result Value   Sodium 127 (*)    Chloride 94 (*)    All other components within normal limits  T4, FREE - Abnormal; Notable for the following components:   Free T4 0.57 (*)    All other components within normal limits  CBC  URINALYSIS, ROUTINE W REFLEX MICROSCOPIC  TSH    EKG None  Radiology DG Chest 2 View  Result Date: 06/01/2020 CLINICAL DATA:  Weight loss EXAM: CHEST - 2 VIEW COMPARISON:  August 27, 2013 FINDINGS: Lungs are clear. Heart size and pulmonary vascularity are normal. No adenopathy. No bone lesions. IMPRESSION: Lungs clear.  Cardiac silhouette normal. Electronically Signed   By: Lowella Grip III M.D.   On: 06/01/2020 14:34    Procedures Procedures (including critical care time)  Medications Ordered in ED Medications  sodium chloride 0.9 % bolus 1,000 mL (0 mLs Intravenous Stopped 06/01/20 2232)    ED Course  I have reviewed the triage vital signs and the nursing notes.  Pertinent labs & imaging results that were available during my care of the patient were reviewed by me and considered in my medical decision making (see chart for details).    MDM Rules/Calculators/A&P                          Patient presenting today for hyponatremia, noted in outpatient labs drawn this morning. Patient reports he has been eating and drinking well, no diarrhea or vomiting. His thyroid medications have been adjusted recently outpatient which has overall been making him feel better, as he had  low energy. He has had no gait instability or excessive thirst. He is mentating appropriately. VSS. Repeat CMP today with Na 127, nl glucose. CBC unremarkable. U/A negative. TSH is 1, free T4 improved from previous. EKG without arrhythmia. Patient given 1L NS in the ED. Discussed with and evaluated by attending Dr. Fulton Reek. Patient has PCP appointment tomorrow. Believe he can be discharged with close follow up tomorrow, PCP can recheck Na level. Patient is well-appearing and in no acute distress, agreeable with care plan.  Discussed results, findings, treatment and follow up. Patient advised  of return precautions. Patient verbalized understanding and agreed with plan.  Final Clinical Impression(s) / ED Diagnoses Final diagnoses:  Hyponatremia    Rx / DC Orders ED Discharge Orders    None       Francetta Ilg, [place] N, PA-C 06/01/20 2354    Lorelle Gibbs, DO 06/02/20 1601

## 2020-06-01 NOTE — Discharge Instructions (Addendum)
Please attend your primary care appointment tomorrow and be sure they recheck your sodium level. Return to the emergency department for any new or concerning symptoms.

## 2020-06-01 NOTE — ED Triage Notes (Signed)
Patient sent by PCP, his sodium was 125, drawn this morning. Patient is alert and oriented, no complaints.

## 2020-06-01 NOTE — Progress Notes (Signed)
I called patient and spoke to him regarding concerns for low sodium level; recommended ER for treatment today; he understands that we cannot write orders at ER- he will need to check in and let them know he is being sent for low sodium level; he agrees to go and understands he can go to ER of his choice.

## 2020-06-02 ENCOUNTER — Encounter: Payer: Self-pay | Admitting: Internal Medicine

## 2020-06-02 ENCOUNTER — Ambulatory Visit (INDEPENDENT_AMBULATORY_CARE_PROVIDER_SITE_OTHER): Payer: Medicare Other | Admitting: Internal Medicine

## 2020-06-02 ENCOUNTER — Encounter: Payer: Self-pay | Admitting: Family

## 2020-06-02 VITALS — BP 124/82 | HR 67 | Temp 98.2°F | Ht 70.0 in | Wt 179.8 lb

## 2020-06-02 DIAGNOSIS — N5201 Erectile dysfunction due to arterial insufficiency: Secondary | ICD-10-CM | POA: Diagnosis not present

## 2020-06-02 DIAGNOSIS — I4891 Unspecified atrial fibrillation: Secondary | ICD-10-CM | POA: Insufficient documentation

## 2020-06-02 DIAGNOSIS — E038 Other specified hypothyroidism: Secondary | ICD-10-CM | POA: Insufficient documentation

## 2020-06-02 DIAGNOSIS — E871 Hypo-osmolality and hyponatremia: Secondary | ICD-10-CM

## 2020-06-02 DIAGNOSIS — E039 Hypothyroidism, unspecified: Secondary | ICD-10-CM | POA: Diagnosis not present

## 2020-06-02 DIAGNOSIS — Z8546 Personal history of malignant neoplasm of prostate: Secondary | ICD-10-CM | POA: Diagnosis not present

## 2020-06-02 LAB — CBC
HCT: 41.9 % (ref 39.0–52.0)
Hemoglobin: 14.3 g/dL (ref 13.0–17.0)
MCHC: 34.2 g/dL (ref 30.0–36.0)
MCV: 87.5 fl (ref 78.0–100.0)
Platelets: 260 10*3/uL (ref 150.0–400.0)
RBC: 4.79 Mil/uL (ref 4.22–5.81)
RDW: 14.3 % (ref 11.5–15.5)
WBC: 6.4 10*3/uL (ref 4.0–10.5)

## 2020-06-02 LAB — COMPREHENSIVE METABOLIC PANEL
ALT: 9 U/L (ref 0–53)
AST: 19 U/L (ref 0–37)
Albumin: 4.3 g/dL (ref 3.5–5.2)
Alkaline Phosphatase: 41 U/L (ref 39–117)
BUN: 18 mg/dL (ref 6–23)
CO2: 27 mEq/L (ref 19–32)
Calcium: 9.2 mg/dL (ref 8.4–10.5)
Chloride: 96 mEq/L (ref 96–112)
Creatinine, Ser: 1.04 mg/dL (ref 0.40–1.50)
GFR: 70.76 mL/min (ref >60.00)
Glucose, Bld: 100 mg/dL — ABNORMAL HIGH (ref 70–99)
Potassium: 4.3 mEq/L (ref 3.5–5.1)
Sodium: 128 mEq/L — ABNORMAL LOW (ref 135–145)
Total Bilirubin: 0.6 mg/dL (ref 0.2–1.2)
Total Protein: 7.4 g/dL (ref 6.0–8.3)

## 2020-06-02 NOTE — Assessment & Plan Note (Addendum)
Rate controlled on metoprolol 12.5 mg BID and on eliquis for NOAC. Sounds to be in sinus today, EKG from yesterday at ER reviewed and in sinus at that time.

## 2020-06-02 NOTE — Patient Instructions (Signed)
We are checking the labs today.  

## 2020-06-02 NOTE — Assessment & Plan Note (Signed)
Taking 37.5 mcg daily with exception of 75 mcg on Tuesday and Thursday. Free T4 improving when checked yesterday to 0.57 from 0.4 about 2 weeks ago. Will not change dose further as still not enough time to equilibrate. Could have been related to his recent hyponatremia.

## 2020-06-02 NOTE — Assessment & Plan Note (Signed)
Improving on discharge from ER with fluids. May have been related to his hypothyroidism which is improving and is closer to normal. Recheck CBC and CMP today and adjust as needed. Advised to eat and drink normally. Avoid free water excessive.

## 2020-06-02 NOTE — Progress Notes (Signed)
   Subjective:   Patient ID: Jermaine Soto, male    DOB: 10-25-1945, 74 y.o.   MRN: [id number]  HPI The patient is a 74 YO man coming in for ER follow up. Seen in the office yesterday and found to have low sodium. Sent to ER and given IV fluids. Thought to be related to his hypothyroidism and he is adjusting medication for that. He is feeling improved after the IV fluids in the ER. Admits to being hungry now due to missing meal but otherwise eating and drinking well. Denies nausea or vomiting. Denies diarrhea. Denies chest pains or SOB. Denies dizziness or lightheadedness. Overall not perfect but significantly improved from yesterday.  PMH, [facility], social history reviewed and updated  Review of Systems  Constitutional: Positive for fatigue and unexpected weight change.  HENT: Negative.   Eyes: Negative.   Respiratory: Negative for cough, chest tightness and shortness of breath.   Cardiovascular: Negative for chest pain, palpitations and leg swelling.  Gastrointestinal: Negative for abdominal distention, abdominal pain, constipation, diarrhea, nausea and vomiting.  Musculoskeletal: Negative.   Skin: Negative.   Neurological: Negative.   Psychiatric/Behavioral: Negative.     Objective:  Physical Exam Constitutional:      Appearance: He is well-developed and well-nourished.  HENT:     Head: Normocephalic and atraumatic.  Eyes:     Extraocular Movements: EOM normal.  Cardiovascular:     Rate and Rhythm: Normal rate and regular rhythm.  Pulmonary:     Effort: Pulmonary effort is normal. No respiratory distress.     Breath sounds: Normal breath sounds. No wheezing or rales.  Abdominal:     General: Bowel sounds are normal. There is no distension.     Palpations: Abdomen is soft.     Tenderness: There is no abdominal tenderness. There is no rebound.  Musculoskeletal:        General: No edema.     Cervical back: Normal range of motion.  Skin:    General: Skin is warm and dry.   Neurological:     Mental Status: He is alert and oriented to person, place, and time.     Coordination: Coordination normal.  Psychiatric:        Mood and Affect: Mood and affect normal.     Vitals:   06/02/20 0842  BP: 124/82  Pulse: 67  Temp: 98.2 F (36.8 C)  TempSrc: Oral  SpO2: 97%  Weight: 179 lb 12.8 oz (81.6 kg)  Height: 5\' 10"  (1.778 m)    This visit occurred during the SARS-CoV-2 public health emergency.  Safety protocols were in place, including screening questions prior to the visit, additional usage of staff PPE, and extensive cleaning of exam room while observing appropriate contact time as indicated for disinfecting solutions.   Assessment & Plan:

## 2020-06-06 ENCOUNTER — Other Ambulatory Visit: Payer: Self-pay | Admitting: Family

## 2020-06-06 DIAGNOSIS — R634 Abnormal weight loss: Secondary | ICD-10-CM

## 2020-06-12 NOTE — Progress Notes (Signed)
Cardiology Office Note:    Date:  06/23/2020   ID:  Jermaine Soto, DOB 1946/05/13, MRN [id number]  PCP:  Olive Bass, FNP  [facility] Cardiologist:  Meriam Sprague, MD  [facility] Electrophysiologist:  None   Referring MD: Daisy Floro, MD    History of Present Illness:    Jermaine Soto is a 75 y.o. male with a hx of hypothyroidism and Afib who presents to clinic for follow-up.  During our last visit on 10/27, the patient was seen after he was noted to be in Afib in the office with his PCP. He was started on apixaban for [facility]. He was also notably losing significant amount of weight in the setting of hyperthyroidism (TSH 0.088) from supratherapeutic doses of synthroid. We decreased his synthroid to 37.61mcg at that time. TTE showed LVEF 50-55%, no WMA, mild RAE, normal LA, no significant valvular disease.   During our last visit on 05/19/20, the patient was having episodes of HA, low blood pressure, and fatigue at home. We repeated his TSH and it was significantly improved to 0.744.  He presented to [facility] on 12/22 with hyponatremia detected on out-patient lab work with Na 125. Thought may be due to poor po intake as glucose normal, TSH improved, no GI symptoms. He was given IVF with improvement. Repeat Na 128.  He re-presentd to [facility] on 06/15/20 with left gaze preference and right sided neglect and global aphasia. MRI was negative. He was found to be profoundly hyponatremic and other work-up including MRI brain, CT angiogram and EEG negative. Urine studies consistent with SIADH. Put on 2L restriction with improvement of symptoms. Discharged home on 06/20/20.  Today, the patient states he feels much improved since discharge. He continues to be on a fluid restriction of 1.5L, which has found to be difficult. He is sleeping better now, but continues to be fatigued. He has increased his PO intake which has helped his energy level. He is seeing Endocrine tomorrow.  Past Medical  History:  Diagnosis Date  . Arthritis    hands   . Hypothyroidism     Past Surgical History:  Procedure Laterality Date  . IR ANGIO INTRA EXTRACRAN SEL COM CAROTID INNOMINATE BILAT MOD SED  06/15/2020  . IR ANGIO VERTEBRAL SEL VERTEBRAL BILAT MOD SED  06/15/2020  . RADIOLOGY WITH ANESTHESIA N/A 06/15/2020   Procedure: IR WITH ANESTHESIA;  Surgeon: Radiologist, Medication, MD;  Location: [facility] OR;  Service: Radiology;  Laterality: N/A;  . ROBOT ASSISTED LAPAROSCOPIC RADICAL PROSTATECTOMY N/A 12/24/2012   Procedure: ROBOTIC ASSISTED LAPAROSCOPIC RADICAL PROSTATECTOMY;  Surgeon: Valetta Fuller, MD;  Location: [facility] ORS;  Service: Urology;  Laterality: N/A;    Current Medications: Current Meds  Medication Sig  . acetaminophen (TYLENOL) 325 MG tablet Take 325 mg by mouth every 6 (six) hours as needed for mild pain, fever or headache.  . Cholecalciferol (VITAMIN D3) 125 MCG (5000 UT) CAPS Take 5,000 Units by mouth daily.  . diphenhydrAMINE HCl (ZZZQUIL) 50 MG/30ML LIQD Take 30 mLs by mouth as needed (sleep).  . rosuvastatin (CRESTOR) 10 MG tablet Take 1 tablet (10 mg total) by mouth daily.  . [DISCONTINUED] apixaban (ELIQUIS) 5 MG TABS tablet Take 1 tablet (5 mg total) by mouth 2 (two) times daily.  . [DISCONTINUED] levothyroxine (SYNTHROID) 75 MCG tablet Take 0.5 tablets (37.5 mcg total) by mouth daily before breakfast. (Patient taking differently: Take 37.5-75 mcg by mouth See admin instructions. on Tuesday and Thursday 37.44mcg every other day)  Allergies:   Sulfa antibiotics   Social History   Socioeconomic History  . Marital status: Married    Spouse name: Not on file  . Number of children: Not on file  . Years of education: Not on file  . Highest education level: Not on file  Occupational History  . Not on file  Tobacco Use  . Smoking status: Never Smoker  . Smokeless tobacco: Never Used  Substance and Sexual Activity  . Alcohol use: Yes    Comment: rare  . Drug use: No  .  Sexual activity: Not on file  Other Topics Concern  . Not on file  Social History Narrative  . Not on file   Social Determinants of Health   Financial Resource Strain: Not on file  Food Insecurity: Not on file  Transportation Needs: Not on file  Physical Activity: Not on file  Stress: Not on file  Social Connections: Not on file     Family History: The patient's family history is not on file.  ROS:   Please see the history of present illness.    Review of Systems  Constitutional: Negative for chills and fever.  HENT: Negative for nosebleeds.   Eyes: Negative for blurred vision and double vision.  Respiratory: Negative for shortness of breath.   Cardiovascular: Negative for chest pain, palpitations, orthopnea, claudication, leg swelling and PND.  Gastrointestinal: Negative for blood in stool, heartburn, melena, nausea and vomiting.  Genitourinary: Negative for hematuria.  Musculoskeletal: Positive for back pain, joint pain and myalgias. Negative for falls.  Neurological: Positive for weakness. Negative for dizziness, seizures and loss of consciousness.  Endo/Heme/Allergies: Negative for polydipsia.  Psychiatric/Behavioral: Negative for substance abuse.    EKGs/Labs/Other Studies Reviewed:    The following studies were reviewed today: TTE 04-May-2020: IMPRESSIONS  1. Left ventricular ejection fraction, by estimation, is 50 to 55%. The  left ventricle has low normal function. The left ventricle has no regional  wall motion abnormalities. Left ventricular diastolic function could not  be evaluated.  2. Right ventricular systolic function is normal. The right ventricular  size is normal. There is normal pulmonary artery systolic pressure.  3. Right atrial size was mildly dilated.  4. The mitral valve is normal in structure. Mild mitral valve  regurgitation. No evidence of mitral stenosis.  5. The aortic valve is normal in structure. Aortic valve regurgitation is  not  visualized. No aortic stenosis is present.   CT Chest 06/17/19: FINDINGS: CT CHEST FINDINGS  Cardiovascular: UPPER limits normal heart size noted. Coronary artery atherosclerotic calcification involving the LEFT main and LAD noted. Mild thoracic aortic atherosclerotic calcifications noted without aneurysm. No pericardial effusion.  Mediastinum/Nodes: No enlarged mediastinal, hilar, or axillary lymph nodes. Thyroid gland, trachea, and esophagus demonstrate no significant findings.  Lungs/Pleura: Mild to moderate atelectasis in both lung bases and in the SUPERIOR segment LEFT LOWER lobe noted.  There is no evidence of airspace disease, consolidation, mass, suspicious nodule, pleural effusion or pneumothorax.  Musculoskeletal: No acute or suspicious bony abnormalities are noted.  CT ABDOMEN PELVIS FINDINGS  Hepatobiliary: The liver and gallbladder are unremarkable. No biliary dilatation.  Pancreas: Unremarkable  Spleen: Unremarkable  Adrenals/Urinary Tract: The kidneys, adrenal glands and bladder are unremarkable.  Stomach/Bowel: Stomach is within normal limits. Appendix appears normal. No evidence of bowel wall thickening, distention, or inflammatory changes.  Vascular/Lymphatic: Aortic atherosclerosis. No enlarged abdominal or pelvic lymph nodes.  Reproductive: Prostatectomy.  Other: No ascites, focal collection or pneumoperitoneum.  Musculoskeletal: No acute  or suspicious bony abnormalities. 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  IMPRESSION: 1. No evidence of primary malignancy or metastatic disease. 2. Mild to moderate bibasilar and SUPERIOR segment LEFT LOWER lobe atelectasis. 3. Coronary artery disease. 4. Aortic Atherosclerosis (ICD10-I70.0).  MRI head 06/16/20: FINDINGS: Brain: Examination mildly degraded by motion artifact.  Cerebral volume within normal limits. Mild chronic  microvascular ischemic disease noted involving the periventricular white matter. No abnormal foci of restricted diffusion to suggest acute or subacute ischemia. Gray-white matter differentiation maintained. No encephalomalacia to suggest chronic cortical infarction. No evidence for acute or chronic intracranial hemorrhage.  1.4 x 1.1 x 1.2 cm well-circumscribed cystic lesion seen within the pituitary gland, indeterminate, but 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 Suprasellar extension without significant mass effect on the optic chiasm.  No other mass lesion, midline shift or mass effect. No hydrocephalus or extra-axial fluid collection.  Vascular: Major intracranial vascular flow voids are well maintained.  Skull and upper cervical spine: Craniocervical junction within normal limits. Bone marrow signal intensity normal. No scalp soft tissue abnormality.  Sinuses/Orbits: Patient status post bilateral ocular lens replacement. Globes and orbital soft tissues within normal limits. Fluid seen within the nasopharynx. Patient is intubated. Trace bilateral mastoid effusions noted.  Other: None.  IMPRESSION: 1. No acute intracranial abnormality. 2. Mild chronic microvascular ischemic disease for age. 3. 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  EKG:  EKG 06/19/20.  The ekg ordered today demonstrates NSR with HR 70  Recent Labs: 06/15/2020: ALT 11; TSH 0.877 06/18/2020: Hemoglobin 14.0; Magnesium 2.0; Platelets 205 06/20/2020: BUN 19; Creatinine, Ser 0.85; Potassium 4.3; Sodium 124  Recent Lipid Panel    Component Value Date/Time   CHOL 188 06/16/2020 0344   TRIG 107 06/16/2020 0344   TRIG 108 06/16/2020 0344   HDL 54 06/16/2020 0344   CHOLHDL 3.5 06/16/2020 0344   VLDL 21 06/16/2020 0344   LDLCALC 113 (H) 06/16/2020 0344     Risk  Assessment/Calculations:    { CHA2DS2-VASc Score = 2  This indicates a 2.2% annual risk of stroke. The patient's score is based upon: CHF History: No HTN History: No Diabetes History: No Stroke History: No Vascular Disease History: Yes Age Score: 1 Gender Score: 0    Physical Exam:    VS:  BP 112/60   Pulse 90   Ht 5\' 10"  (1.778 m)   Wt 174 lb 12.8 oz (79.3 kg)   SpO2 98%   BMI 25.08 kg/m     Wt Readings from Last 3 Encounters:  06/23/20 174 lb 12.8 oz (79.3 kg)  06/15/20 181 lb 10.5 oz (82.4 kg)  06/02/20 179 lb 12.8 oz (81.6 kg)     GEN:  Well nourished, well developed in no acute distress HEENT: Normal NECK: No JVD; No carotid bruits CARDIAC: RRR, no murmurs, rubs, gallops RESPIRATORY:  Clear to auscultation without rales, wheezing or rhonchi  ABDOMEN: Soft, non-tender, non-distended MUSCULOSKELETAL:  No edema; No deformity  SKIN: Warm and dry NEUROLOGIC:  Alert and oriented x 3 PSYCHIATRIC:  Normal affect   ASSESSMENT:    1. Atrial fibrillation, unspecified type ([place])   2. Thyroid disease   3. Weight loss   4. Hyponatremia    PLAN:    In order of problems listed above:  #Atrial fibrillation: CHADs-vasc 2 for age and coronary/aortic atherosclerosis noted on CT chest. Developed in the  setting of elevated thyroid levels. Wanted to proceed with anticoagulation to reduce stroke risk as patient is pre-diabetic and has a positive family history for stroke in the family. Tolerating apixaban well with no bleeding. Not on BB. Back in NSR. -Continue eliquis 5mg  BID for anticoagulation -Stopped metoprolol as having episodes of hypotension; can take prn for tachycardia or elevated Bps at home -TTE with LVEF 50-55%, no regional WMA  #Iatrogenic hyperthyroidism #History of hypothyroidism #Enlarged thyroid gland: #Weight loss: Patient with unintentional weight loss of 40lbs over the past 34months. Suspect secondary to hyperthyroidism given low TSH and stable weight  since decreasing dose. Last colo 1 year ago with polyp and planned repeat in 3 years. Had prostatectomy for elevated PSA. Will treat underlying thyroid and if continues to lose weight, will refer back to GI for work-up as well as other cancer screening with PCP. -Refer to Endocrinology for management of thyroid disease -CT on c/a/p with no thyroid abnormalities, no masses  #Coronary Calcification: Noted on CT chest. Asymptomatic with no exertional chest pain, SOB or faitgue. -Start crestor 10mg  daily -Repeat lipids at next visit in 2 months  #Hyponatremia #Recent hospitalization with AMS, stroke-like symptoms: Patient admitted in early Jan 2022 with left gaze deviation and right sided neglect as well as AMS. MRI head without stroke. CTA without significant vascular disease. Did not received tPA as on systemic AC. EEG without seizures. Thought symptoms driven by hyponatremia with Na 117. Urine studies consistent with SIADH. Currently feeling better with no recurrence of symptoms. On 1.5L fluid restriction. -Follow-up with Endocrinology tomorrow -Repeat Na at that visit -May consider checking cortisol as well given hyponatremia and relative hypotension/fatigue  #Weight loss: Patient with profound weight loss thought to be driven by iatrogenic hyperthyroidism. CT chest/abd/pelvis without evidence of mass. He is s/p prostatectomy and last colonoscopy 1 year ago was okay. Now stabilized. -Follow-up with endocrine as above -Fortunately, no evidence of malignancy on CT c/a/p -Increase caloric intake   Medication Adjustments/Labs and Tests Ordered: Current medicines are reviewed at length with the patient today.  Concerns regarding medicines are outlined above.  No orders of the defined types were placed in this encounter.  Meds ordered this encounter  Medications  . rosuvastatin (CRESTOR) 10 MG tablet    Sig: Take 1 tablet (10 mg total) by mouth daily.    Dispense:  90 tablet    Refill:  3   . apixaban (ELIQUIS) 5 MG TABS tablet    Sig: Take 1 tablet (5 mg total) by mouth 2 (two) times daily.    Dispense:  180 tablet    Refill:  1  . DISCONTD: levothyroxine (SYNTHROID) 75 MCG tablet    Sig: Take 21mcg by mouth on Tuesday and Thursday Take 37.38mcg by mouth on all other days    Dispense:  55 tablet    Refill:  3  . levothyroxine (SYNTHROID) 75 MCG tablet    Sig: Take 78mcg by mouth on Tuesday and Thursday. Take 37.56mcg by mouth on all other days    Dispense:  55 tablet    Refill:  3    Patient Instructions  Medication Instructions:  1) START Rosuvastatin 10mg  once daily  *If you need a refill on your cardiac medications before your next appointment, please call your pharmacy*   Lab Work: Come fasting to your next appointment and we will plan to draw labs while you are here.  Nothing to eat or drink after midnight except water and black coffee.  If you have labs (blood work) drawn today and your tests are completely normal, you will receive your results only by: Marland Kitchen MyChart Message (if you have MyChart) OR . A paper copy in the mail If you have any lab test that is abnormal or we need to change your treatment, we will call you to review the results.   Testing/Procedures: None   Follow-Up: At [facility], you and your health needs are our priority.  As part of our continuing mission to provide you with exceptional heart care, we have created designated Provider Care Teams.  These Care Teams include your primary Cardiologist (physician) and Advanced Practice Providers (APPs -  Physician Assistants and Nurse Practitioners) who all work together to provide you with the care you need, when you need it.  We recommend signing up for the patient portal called "MyChart".  Sign up information is provided on this After Visit Summary.  MyChart is used to connect with patients for Virtual Visits (Telemedicine).  Patients are able to view lab/test results, encounter notes,  upcoming appointments, etc.  Non-urgent messages can be sent to your provider as well.   To learn more about what you can do with MyChart, go to [email or web address].    Your next appointment:   2 month(s)  The format for your next appointment:   In Person  Provider:   You may see Freada Bergeron, MD or one of the following Advanced Practice Providers on your designated Care Team:    Richardson Dopp, PA-C  Robbie Lis, [place]    Other Instructions      Signed, Freada Bergeron, MD  06/23/2020 10:50 AM    [place]

## 2020-06-13 ENCOUNTER — Encounter: Payer: Self-pay | Admitting: Family

## 2020-06-13 ENCOUNTER — Other Ambulatory Visit: Payer: Self-pay | Admitting: Family

## 2020-06-13 DIAGNOSIS — E871 Hypo-osmolality and hyponatremia: Secondary | ICD-10-CM

## 2020-06-15 ENCOUNTER — Other Ambulatory Visit: Payer: Self-pay

## 2020-06-15 ENCOUNTER — Emergency Department (HOSPITAL_COMMUNITY): Payer: Medicare Other | Admitting: Certified Registered"

## 2020-06-15 ENCOUNTER — Emergency Department (HOSPITAL_COMMUNITY): Payer: Medicare Other

## 2020-06-15 ENCOUNTER — Inpatient Hospital Stay (HOSPITAL_COMMUNITY): Payer: Medicare Other

## 2020-06-15 ENCOUNTER — Encounter (HOSPITAL_COMMUNITY)
Admission: EM | Disposition: A | Discharge status: Rehabilitation Facility | Payer: Self-pay | Source: Home / Self Care | Attending: Internal Medicine

## 2020-06-15 ENCOUNTER — Inpatient Hospital Stay: Admit: 2020-06-15 | Payer: Medicare Other

## 2020-06-15 ENCOUNTER — Telehealth: Payer: Self-pay | Admitting: Cardiology

## 2020-06-15 ENCOUNTER — Inpatient Hospital Stay (HOSPITAL_COMMUNITY)
Admission: EM | Admit: 2020-06-15 | Discharge: 2020-06-20 | DRG: 067 | Disposition: A | Discharge status: Rehabilitation Facility | Payer: Medicare Other | Attending: Internal Medicine | Admitting: Internal Medicine

## 2020-06-15 DIAGNOSIS — E039 Hypothyroidism, unspecified: Secondary | ICD-10-CM | POA: Diagnosis not present

## 2020-06-15 DIAGNOSIS — Z79890 Hormone replacement therapy (postmenopausal): Secondary | ICD-10-CM

## 2020-06-15 DIAGNOSIS — I10 Essential (primary) hypertension: Secondary | ICD-10-CM | POA: Diagnosis not present

## 2020-06-15 DIAGNOSIS — Z20822 Contact with and (suspected) exposure to covid-19: Secondary | ICD-10-CM | POA: Diagnosis not present

## 2020-06-15 DIAGNOSIS — R404 Transient alteration of awareness: Secondary | ICD-10-CM | POA: Diagnosis not present

## 2020-06-15 DIAGNOSIS — E861 Hypovolemia: Secondary | ICD-10-CM | POA: Diagnosis present

## 2020-06-15 DIAGNOSIS — E440 Moderate protein-calorie malnutrition: Secondary | ICD-10-CM | POA: Diagnosis not present

## 2020-06-15 DIAGNOSIS — G9340 Encephalopathy, unspecified: Secondary | ICD-10-CM | POA: Diagnosis not present

## 2020-06-15 DIAGNOSIS — Z8673 Personal history of transient ischemic attack (TIA), and cerebral infarction without residual deficits: Secondary | ICD-10-CM | POA: Diagnosis not present

## 2020-06-15 DIAGNOSIS — E785 Hyperlipidemia, unspecified: Secondary | ICD-10-CM | POA: Diagnosis present

## 2020-06-15 DIAGNOSIS — R29818 Other symptoms and signs involving the nervous system: Secondary | ICD-10-CM | POA: Diagnosis not present

## 2020-06-15 DIAGNOSIS — R569 Unspecified convulsions: Secondary | ICD-10-CM | POA: Diagnosis not present

## 2020-06-15 DIAGNOSIS — E222 Syndrome of inappropriate secretion of antidiuretic hormone: Secondary | ICD-10-CM | POA: Diagnosis not present

## 2020-06-15 DIAGNOSIS — Z431 Encounter for attention to gastrostomy: Secondary | ICD-10-CM | POA: Diagnosis not present

## 2020-06-15 DIAGNOSIS — M19041 Primary osteoarthritis, right hand: Secondary | ICD-10-CM | POA: Diagnosis not present

## 2020-06-15 DIAGNOSIS — J9589 Other postprocedural complications and disorders of respiratory system, not elsewhere classified: Secondary | ICD-10-CM | POA: Diagnosis not present

## 2020-06-15 DIAGNOSIS — R1310 Dysphagia, unspecified: Secondary | ICD-10-CM | POA: Diagnosis not present

## 2020-06-15 DIAGNOSIS — G8191 Hemiplegia, unspecified affecting right dominant side: Secondary | ICD-10-CM | POA: Diagnosis not present

## 2020-06-15 DIAGNOSIS — H5347 Heteronymous bilateral field defects: Secondary | ICD-10-CM | POA: Diagnosis present

## 2020-06-15 DIAGNOSIS — Z882 Allergy status to sulfonamides status: Secondary | ICD-10-CM

## 2020-06-15 DIAGNOSIS — R471 Dysarthria and anarthria: Secondary | ICD-10-CM | POA: Diagnosis present

## 2020-06-15 DIAGNOSIS — Z4682 Encounter for fitting and adjustment of non-vascular catheter: Secondary | ICD-10-CM | POA: Diagnosis not present

## 2020-06-15 DIAGNOSIS — I480 Paroxysmal atrial fibrillation: Secondary | ICD-10-CM | POA: Diagnosis present

## 2020-06-15 DIAGNOSIS — I700 Atherosclerosis of aorta: Secondary | ICD-10-CM | POA: Diagnosis present

## 2020-06-15 DIAGNOSIS — Z452 Encounter for adjustment and management of vascular access device: Secondary | ICD-10-CM | POA: Diagnosis not present

## 2020-06-15 DIAGNOSIS — J9601 Acute respiratory failure with hypoxia: Secondary | ICD-10-CM | POA: Diagnosis present

## 2020-06-15 DIAGNOSIS — G9341 Metabolic encephalopathy: Secondary | ICD-10-CM | POA: Diagnosis not present

## 2020-06-15 DIAGNOSIS — R4020 Unspecified coma: Secondary | ICD-10-CM | POA: Diagnosis not present

## 2020-06-15 DIAGNOSIS — R4701 Aphasia: Secondary | ICD-10-CM | POA: Diagnosis not present

## 2020-06-15 DIAGNOSIS — Z781 Physical restraint status: Secondary | ICD-10-CM

## 2020-06-15 DIAGNOSIS — I6602 Occlusion and stenosis of left middle cerebral artery: Principal | ICD-10-CM | POA: Diagnosis present

## 2020-06-15 DIAGNOSIS — Z79899 Other long term (current) drug therapy: Secondary | ICD-10-CM

## 2020-06-15 DIAGNOSIS — S0990XA Unspecified injury of head, initial encounter: Secondary | ICD-10-CM | POA: Diagnosis not present

## 2020-06-15 DIAGNOSIS — I6300 Cerebral infarction due to thrombosis of unspecified precerebral artery: Secondary | ICD-10-CM | POA: Diagnosis not present

## 2020-06-15 DIAGNOSIS — Z8546 Personal history of malignant neoplasm of prostate: Secondary | ICD-10-CM

## 2020-06-15 DIAGNOSIS — R0902 Hypoxemia: Secondary | ICD-10-CM | POA: Diagnosis not present

## 2020-06-15 DIAGNOSIS — Z978 Presence of other specified devices: Secondary | ICD-10-CM

## 2020-06-15 DIAGNOSIS — E871 Hypo-osmolality and hyponatremia: Secondary | ICD-10-CM | POA: Diagnosis not present

## 2020-06-15 DIAGNOSIS — I959 Hypotension, unspecified: Secondary | ICD-10-CM | POA: Diagnosis present

## 2020-06-15 DIAGNOSIS — Z9079 Acquired absence of other genital organ(s): Secondary | ICD-10-CM

## 2020-06-15 DIAGNOSIS — Z7901 Long term (current) use of anticoagulants: Secondary | ICD-10-CM

## 2020-06-15 DIAGNOSIS — M19042 Primary osteoarthritis, left hand: Secondary | ICD-10-CM | POA: Diagnosis present

## 2020-06-15 DIAGNOSIS — E119 Type 2 diabetes mellitus without complications: Secondary | ICD-10-CM | POA: Diagnosis present

## 2020-06-15 DIAGNOSIS — Z4659 Encounter for fitting and adjustment of other gastrointestinal appliance and device: Secondary | ICD-10-CM

## 2020-06-15 DIAGNOSIS — I639 Cerebral infarction, unspecified: Secondary | ICD-10-CM

## 2020-06-15 DIAGNOSIS — E860 Dehydration: Secondary | ICD-10-CM | POA: Diagnosis present

## 2020-06-15 DIAGNOSIS — I4891 Unspecified atrial fibrillation: Secondary | ICD-10-CM | POA: Diagnosis present

## 2020-06-15 DIAGNOSIS — I361 Nonrheumatic tricuspid (valve) insufficiency: Secondary | ICD-10-CM | POA: Diagnosis not present

## 2020-06-15 DIAGNOSIS — R42 Dizziness and giddiness: Secondary | ICD-10-CM | POA: Diagnosis not present

## 2020-06-15 DIAGNOSIS — R5383 Other fatigue: Secondary | ICD-10-CM | POA: Diagnosis not present

## 2020-06-15 DIAGNOSIS — R4182 Altered mental status, unspecified: Secondary | ICD-10-CM | POA: Diagnosis not present

## 2020-06-15 DIAGNOSIS — Z9889 Other specified postprocedural states: Secondary | ICD-10-CM | POA: Diagnosis not present

## 2020-06-15 DIAGNOSIS — Z4889 Encounter for other specified surgical aftercare: Secondary | ICD-10-CM

## 2020-06-15 HISTORY — PX: IR ANGIO VERTEBRAL SEL VERTEBRAL BILAT MOD SED: IMG5369

## 2020-06-15 HISTORY — PX: RADIOLOGY WITH ANESTHESIA: SHX6223

## 2020-06-15 HISTORY — PX: IR ANGIO INTRA EXTRACRAN SEL COM CAROTID INNOMINATE BILAT MOD SED: IMG5360

## 2020-06-15 LAB — ETHANOL: Alcohol, Ethyl (B): <10 mg/dL (ref <10)

## 2020-06-15 LAB — BLOOD GAS, ARTERIAL
Acid-base deficit: 2.8 mmol/L — ABNORMAL HIGH (ref 0.0–2.0)
Bicarbonate: 20.9 mmol/L (ref 20.0–28.0)
Drawn by: 54887
FIO2: 40
O2 Saturation: 99.3 %
Patient temperature: 37
pCO2 arterial: 32.3 mmHg (ref 32.0–48.0)
pH, Arterial: 7.426 (ref 7.350–7.450)
pO2, Arterial: 183 mmHg — ABNORMAL HIGH (ref 83.0–108.0)

## 2020-06-15 LAB — URINALYSIS, COMPLETE (UACMP) WITH MICROSCOPIC
Bacteria, UA: NONE SEEN
Bilirubin Urine: NEGATIVE
Glucose, UA: NEGATIVE mg/dL
Hgb urine dipstick: NEGATIVE
Ketones, ur: NEGATIVE mg/dL
Leukocytes,Ua: NEGATIVE
Nitrite: NEGATIVE
Protein, ur: NEGATIVE mg/dL
Specific Gravity, Urine: 1.024 (ref 1.005–1.030)
pH: 8 (ref 5.0–8.0)

## 2020-06-15 LAB — COMPREHENSIVE METABOLIC PANEL
ALT: 11 U/L (ref 0–44)
AST: 24 U/L (ref 15–41)
Albumin: 3.8 g/dL (ref 3.5–5.0)
Alkaline Phosphatase: 49 U/L (ref 38–126)
Anion gap: 12 (ref 5–15)
BUN: 16 mg/dL (ref 8–23)
CO2: 20 mmol/L — ABNORMAL LOW (ref 22–32)
Calcium: 8.8 mg/dL — ABNORMAL LOW (ref 8.9–10.3)
Chloride: 89 mmol/L — ABNORMAL LOW (ref 98–111)
Creatinine, Ser: 1.12 mg/dL (ref 0.61–1.24)
GFR, Estimated: >60 mL/min (ref >60)
Glucose, Bld: 160 mg/dL — ABNORMAL HIGH (ref 70–99)
Potassium: 3.9 mmol/L (ref 3.5–5.1)
Sodium: 121 mmol/L — ABNORMAL LOW (ref 135–145)
Total Bilirubin: 0.7 mg/dL (ref 0.3–1.2)
Total Protein: 6.3 g/dL — ABNORMAL LOW (ref 6.5–8.1)

## 2020-06-15 LAB — PROCALCITONIN: Procalcitonin: <0.1 ng/mL

## 2020-06-15 LAB — DIFFERENTIAL
Abs Immature Granulocytes: 0.03 10*3/uL (ref 0.00–0.07)
Basophils Absolute: 0.1 10*3/uL (ref 0.0–0.1)
Basophils Relative: 1 %
Eosinophils Absolute: 0.2 10*3/uL (ref 0.0–0.5)
Eosinophils Relative: 2 %
Immature Granulocytes: 0 %
Lymphocytes Relative: 35 %
Lymphs Abs: 3.2 10*3/uL (ref 0.7–4.0)
Monocytes Absolute: 0.7 10*3/uL (ref 0.1–1.0)
Monocytes Relative: 7 %
Neutro Abs: 4.9 10*3/uL (ref 1.7–7.7)
Neutrophils Relative %: 55 %

## 2020-06-15 LAB — CBC
HCT: 41.4 % (ref 39.0–52.0)
Hemoglobin: 14.5 g/dL (ref 13.0–17.0)
MCH: 30.3 pg (ref 26.0–34.0)
MCHC: 35 g/dL (ref 30.0–36.0)
MCV: 86.4 fL (ref 80.0–100.0)
Platelets: 228 10*3/uL (ref 150–400)
RBC: 4.79 MIL/uL (ref 4.22–5.81)
RDW: 13 % (ref 11.5–15.5)
WBC: 9 10*3/uL (ref 4.0–10.5)
nRBC: 0 % (ref 0.0–0.2)

## 2020-06-15 LAB — MRSA PCR SCREENING: MRSA by PCR: NEGATIVE

## 2020-06-15 LAB — PROTIME-INR
INR: 1.4 — ABNORMAL HIGH (ref 0.8–1.2)
Prothrombin Time: 16.8 seconds — ABNORMAL HIGH (ref 11.4–15.2)

## 2020-06-15 LAB — I-STAT CHEM 8, ED
BUN: 17 mg/dL (ref 8–23)
Calcium, Ion: 1.1 mmol/L — ABNORMAL LOW (ref 1.15–1.40)
Chloride: 88 mmol/L — ABNORMAL LOW (ref 98–111)
Creatinine, Ser: 1 mg/dL (ref 0.61–1.24)
Glucose, Bld: 166 mg/dL — ABNORMAL HIGH (ref 70–99)
HCT: 43 % (ref 39.0–52.0)
Hemoglobin: 14.6 g/dL (ref 13.0–17.0)
Potassium: 3.9 mmol/L (ref 3.5–5.1)
Sodium: 121 mmol/L — ABNORMAL LOW (ref 135–145)
TCO2: 21 mmol/L — ABNORMAL LOW (ref 22–32)

## 2020-06-15 LAB — TROPONIN I (HIGH SENSITIVITY)
Troponin I (High Sensitivity): 6 ng/L (ref <18)
Troponin I (High Sensitivity): 9 ng/L (ref <18)

## 2020-06-15 LAB — TSH: TSH: 0.877 u[IU]/mL (ref 0.350–4.500)

## 2020-06-15 LAB — RESP PANEL BY RT-PCR (FLU A&B, COVID) ARPGX2
Influenza A by PCR: NEGATIVE
Influenza B by PCR: NEGATIVE
SARS Coronavirus 2 by RT PCR: NEGATIVE

## 2020-06-15 LAB — PHOSPHORUS: Phosphorus: 2.7 mg/dL (ref 2.5–4.6)

## 2020-06-15 LAB — APTT: aPTT: 49 seconds — ABNORMAL HIGH (ref 24–36)

## 2020-06-15 LAB — GLUCOSE, CAPILLARY
Glucose-Capillary: 93 mg/dL (ref 70–99)
Glucose-Capillary: 78 mg/dL (ref 70–99)

## 2020-06-15 LAB — CBG MONITORING, ED: Glucose-Capillary: 153 mg/dL — ABNORMAL HIGH (ref 70–99)

## 2020-06-15 LAB — MAGNESIUM: Magnesium: 2.1 mg/dL (ref 1.7–2.4)

## 2020-06-15 SURGERY — IR WITH ANESTHESIA
Anesthesia: General

## 2020-06-15 MED ORDER — DOCUSATE SODIUM 50 MG/5ML PO LIQD
100.0000 mg | Freq: Two times a day (BID) | ORAL | Status: DC
  Administered 2020-06-15 – 2020-06-16 (×2): 100 mg
  Filled 2020-06-15 (×2): qty 10

## 2020-06-15 MED ORDER — CLOPIDOGREL BISULFATE 300 MG PO TABS
ORAL_TABLET | ORAL | Status: AC
  Filled 2020-06-15: qty 1

## 2020-06-15 MED ORDER — ORAL CARE MOUTH RINSE
15.0000 mL | OROMUCOSAL | Status: DC
  Administered 2020-06-15 – 2020-06-16 (×9): 15 mL via OROMUCOSAL

## 2020-06-15 MED ORDER — METOPROLOL TARTRATE 25 MG/10 ML ORAL SUSPENSION
12.5000 mg | Freq: Two times a day (BID) | ORAL | Status: DC
  Administered 2020-06-15 – 2020-06-16 (×2): 12.5 mg
  Filled 2020-06-15 (×2): qty 10

## 2020-06-15 MED ORDER — CLEVIDIPINE BUTYRATE 0.5 MG/ML IV EMUL
INTRAVENOUS | Status: AC
  Filled 2020-06-15: qty 50

## 2020-06-15 MED ORDER — CLEVIDIPINE BUTYRATE 0.5 MG/ML IV EMUL
0.0000 mg/h | INTRAVENOUS | Status: DC
  Administered 2020-06-15: 1 mg/h via INTRAVENOUS

## 2020-06-15 MED ORDER — ASPIRIN 81 MG PO CHEW
CHEWABLE_TABLET | ORAL | Status: AC
  Filled 2020-06-15: qty 1

## 2020-06-15 MED ORDER — PHENYLEPHRINE HCL-NACL 10-0.9 MG/250ML-% IV SOLN: INTRAVENOUS | Status: DC | PRN

## 2020-06-15 MED ORDER — POLYETHYLENE GLYCOL 3350 17 G PO PACK
17.0000 g | PACK | Freq: Every day | ORAL | Status: DC
  Administered 2020-06-16: 17 g
  Filled 2020-06-15: qty 1

## 2020-06-15 MED ORDER — LORAZEPAM 2 MG/ML IJ SOLN
INTRAMUSCULAR | Status: AC
  Filled 2020-06-15: qty 1

## 2020-06-15 MED ORDER — EPTIFIBATIDE 20 MG/10ML IV SOLN
INTRAVENOUS | Status: AC
  Filled 2020-06-15: qty 10

## 2020-06-15 MED ORDER — FENTANYL CITRATE (PF) 100 MCG/2ML IJ SOLN: 25.0000 ug | INTRAMUSCULAR | Status: DC | PRN

## 2020-06-15 MED ORDER — LORAZEPAM 2 MG/ML IJ SOLN
1.0000 mg | Freq: Once | INTRAMUSCULAR | Status: AC
  Administered 2020-06-15: 1 mg via INTRAVENOUS
  Filled 2020-06-15: qty 1

## 2020-06-15 MED ORDER — SUCCINYLCHOLINE CHLORIDE 200 MG/10ML IV SOSY
PREFILLED_SYRINGE | INTRAVENOUS | Status: DC | PRN
  Administered 2020-06-15: 100 mg via INTRAVENOUS

## 2020-06-15 MED ORDER — PANTOPRAZOLE SODIUM 40 MG IV SOLR
40.0000 mg | Freq: Every day | INTRAVENOUS | Status: DC
  Administered 2020-06-16: 40 mg via INTRAVENOUS
  Filled 2020-06-15: qty 40

## 2020-06-15 MED ORDER — PHENYLEPHRINE HCL-NACL 10-0.9 MG/250ML-% IV SOLN
INTRAVENOUS | Status: DC | PRN
  Administered 2020-06-15: 25 ug/min via INTRAVENOUS
  Administered 2020-06-15: 50 ug/min via INTRAVENOUS

## 2020-06-15 MED ORDER — PROPOFOL 500 MG/50ML IV EMUL
INTRAVENOUS | Status: DC | PRN
  Administered 2020-06-15: 50 ug/kg/min via INTRAVENOUS

## 2020-06-15 MED ORDER — PROPOFOL 10 MG/ML IV BOLUS
INTRAVENOUS | Status: DC | PRN
  Administered 2020-06-15: 150 mg via INTRAVENOUS

## 2020-06-15 MED ORDER — IOHEXOL 350 MG/ML SOLN
75.0000 mL | Freq: Once | INTRAVENOUS | Status: AC
  Administered 2020-06-15: 75 mL via INTRAVENOUS

## 2020-06-15 MED ORDER — LORAZEPAM 2 MG/ML IJ SOLN: 2.0000 mg | Freq: Once | INTRAMUSCULAR | Status: DC | PRN

## 2020-06-15 MED ORDER — MIDAZOLAM HCL 5 MG/5ML IJ SOLN
INTRAMUSCULAR | Status: DC | PRN
  Administered 2020-06-15: 2 mg via INTRAVENOUS

## 2020-06-15 MED ORDER — PHENYLEPHRINE 40 MCG/ML (10ML) SYRINGE FOR IV PUSH (FOR BLOOD PRESSURE SUPPORT)
PREFILLED_SYRINGE | INTRAVENOUS | Status: DC | PRN
  Administered 2020-06-15: 80 ug via INTRAVENOUS

## 2020-06-15 MED ORDER — STROKE: EARLY STAGES OF RECOVERY BOOK: Freq: Once | Status: DC

## 2020-06-15 MED ORDER — IOHEXOL 300 MG/ML  SOLN
150.0000 mL | Freq: Once | INTRAMUSCULAR | Status: AC | PRN
  Administered 2020-06-15: 70 mL via INTRA_ARTERIAL

## 2020-06-15 MED ORDER — VERAPAMIL HCL 2.5 MG/ML IV SOLN
INTRAVENOUS | Status: AC
  Filled 2020-06-15: qty 2

## 2020-06-15 MED ORDER — PROPOFOL 1000 MG/100ML IV EMUL
0.0000 ug/kg/min | INTRAVENOUS | Status: DC
  Administered 2020-06-15: 40 ug/kg/min via INTRAVENOUS
  Administered 2020-06-15: 50 ug/kg/min via INTRAVENOUS
  Filled 2020-06-15 (×4): qty 100

## 2020-06-15 MED ORDER — LIDOCAINE 2% (20 MG/ML) 5 ML SYRINGE
INTRAMUSCULAR | Status: DC | PRN
  Administered 2020-06-15: 100 mg via INTRAVENOUS

## 2020-06-15 MED ORDER — SODIUM CHLORIDE 0.9 % IV SOLN: INTRAVENOUS | Status: DC

## 2020-06-15 MED ORDER — CEFAZOLIN SODIUM-DEXTROSE 2-3 GM-%(50ML) IV SOLR
INTRAVENOUS | Status: DC | PRN
  Administered 2020-06-15: 2 g via INTRAVENOUS

## 2020-06-15 MED ORDER — ACETAMINOPHEN 160 MG/5ML PO SOLN: 650.0000 mg | ORAL | Status: DC | PRN

## 2020-06-15 MED ORDER — ACETAMINOPHEN 650 MG RE SUPP: 650.0000 mg | RECTAL | Status: DC | PRN

## 2020-06-15 MED ORDER — FENTANYL CITRATE (PF) 100 MCG/2ML IJ SOLN
25.0000 ug | INTRAMUSCULAR | Status: DC | PRN
Start: 2020-06-15 — End: 2020-06-15

## 2020-06-15 MED ORDER — INSULIN ASPART 100 UNIT/ML ~~LOC~~ SOLN: 0.0000 [IU] | SUBCUTANEOUS | Status: DC

## 2020-06-15 MED ORDER — CHLORHEXIDINE GLUCONATE 0.12% ORAL RINSE (MEDLINE KIT)
15.0000 mL | Freq: Two times a day (BID) | OROMUCOSAL | Status: DC
  Administered 2020-06-15 – 2020-06-20 (×8): 15 mL via OROMUCOSAL

## 2020-06-15 MED ORDER — TICAGRELOR 90 MG PO TABS
ORAL_TABLET | ORAL | Status: AC
  Filled 2020-06-15: qty 2

## 2020-06-15 MED ORDER — ACETAMINOPHEN 325 MG PO TABS: 650.0000 mg | ORAL_TABLET | ORAL | Status: DC | PRN

## 2020-06-15 MED ORDER — ROCURONIUM BROMIDE 10 MG/ML (PF) SYRINGE
PREFILLED_SYRINGE | INTRAVENOUS | Status: DC | PRN
  Administered 2020-06-15: 50 mg via INTRAVENOUS

## 2020-06-15 MED ORDER — CEFAZOLIN SODIUM-DEXTROSE 2-4 GM/100ML-% IV SOLN
INTRAVENOUS | Status: AC
  Filled 2020-06-15: qty 100

## 2020-06-15 MED ORDER — APIXABAN 5 MG PO TABS
5.0000 mg | ORAL_TABLET | Freq: Two times a day (BID) | ORAL | Status: DC
  Administered 2020-06-16: 5 mg
  Filled 2020-06-15: qty 1

## 2020-06-15 MED ORDER — SODIUM CHLORIDE 0.9% FLUSH: 3.0000 mL | Freq: Once | INTRAVENOUS | Status: DC

## 2020-06-15 MED ORDER — NITROGLYCERIN 1 MG/10 ML FOR IR/CATH LAB
INTRA_ARTERIAL | Status: AC
  Filled 2020-06-15: qty 10

## 2020-06-15 MED ORDER — IOHEXOL 240 MG/ML SOLN
INTRAMUSCULAR | Status: AC
  Filled 2020-06-15: qty 200

## 2020-06-15 MED ORDER — CANGRELOR TETRASODIUM 50 MG IV SOLR
INTRAVENOUS | Status: AC
  Filled 2020-06-15: qty 50

## 2020-06-15 MED ORDER — TIROFIBAN HCL IN NACL 5-0.9 MG/100ML-% IV SOLN
INTRAVENOUS | Status: AC
  Filled 2020-06-15: qty 100

## 2020-06-15 MED ORDER — SENNOSIDES-DOCUSATE SODIUM 8.6-50 MG PO TABS: 1.0000 | ORAL_TABLET | Freq: Every day | ORAL | Status: DC

## 2020-06-15 NOTE — Telephone Encounter (Signed)
Will send this message to Dr. Shari Prows, as a general FYI.

## 2020-06-15 NOTE — ED Triage Notes (Signed)
Pt BIB [facility] w/ complaints of unresponsiveness initially. LKW of 1030 am. Upon EMS assessment pt was found to be hypotensive 70/40 w/ HR in the 40s. Pt was given 400 NS and BP did improve to 150 systolic. Pt remained HR in the 50s. EMS noted pt do be aphasic and mumbling, not following commands and some lower extremity neglect. A code stroke was activated. Pt hx to include Afib on eliquis. PT alert but drowsy. Pt brought to scanner 3 upon arrival.

## 2020-06-15 NOTE — Code Documentation (Signed)
Stroke Response Nurse Documentation Code Documentation  Jermaine Soto is a 75 y.o. male arriving to [facility] via [facility] on 06/15/2020 with past medical hx significant of atrial fibrillation on Eliquis and hypothyroidism. Code stroke was activated by EMS.   Patient from home where he was LKW at 1030 and now complaining of dizziness around 1000. Became lethargic. EMS noted patient to be unresponsive upon arrival. SBP 70s. Given fluid bolus. HR sinus brady. Patient became agitated, would not follow commands, left gaze preference, and aphasia.    Stroke team at the bedside on patient arrival. Delay in labs related to difficult stick. Patient cleared for CT by EDP. Patient to CT with team. NIHSS 21, see documentation for details and code stroke times. Patient with decreased LOC, disoriented, not following commands, left gaze preference , right hemianopia, bilateral arm weakness, bilateral leg weakness, Global aphasia , dysarthria  and Visual  neglect on exam.   Patient restless in CT. Given 1mg  ativan at 1047 for scan. CT scan completed and IR activated. Attempted to get CTA. Patient became restless again. CTA completed with slight delay due to restlessness. Patient is not a candidate for tPA due to patient takes Eliquis. Dr. contacted the patient's wife for consent.  Patient taken to IR bay 8 at 1310. Clothing removed and placed in belonging bag. Pulses marked and groin shaved for procedure. Patient's wife brought back to sign consent form. Patient intubated 1321. Taken to [facility] [address]. Care/Plan admit to ICU post IR procedure. Bedside handoff report given to IR nurse by [employer], ED RN.    Jermaine Soto Stroke Response RN

## 2020-06-15 NOTE — Progress Notes (Signed)
Transported pt from PACU to $N18 without complications. Rt will monitor.

## 2020-06-15 NOTE — Sedation Documentation (Signed)
8fr. Angioseal to right groin 

## 2020-06-15 NOTE — Anesthesia Procedure Notes (Signed)
Procedure Name: Intubation Date/Time: 06/15/2020 1:22 PM Performed by: Shireen Quan, CRNA Pre-anesthesia Checklist: Patient identified, Emergency Drugs available, Suction available and Patient being monitored Patient Re-evaluated:Patient Re-evaluated prior to induction Oxygen Delivery Method: Ambu bag Preoxygenation: Pre-oxygenation with 100% oxygen (COVID filter in line) Induction Type: IV induction and Rapid sequence Laryngoscope Size: Glidescope and 4 Tube type: Oral Tube size: 8.0 mm Number of attempts: 1 Airway Equipment and Method: Rigid stylet and Video-laryngoscopy Placement Confirmation: ETT inserted through vocal cords under direct vision,  positive ETCO2 and breath sounds checked- equal and bilateral Secured at: 24 cm Tube secured with: Tape Dental Injury: Teeth and Oropharynx as per pre-operative assessment

## 2020-06-15 NOTE — Progress Notes (Deleted)
RT to Trauma B for unresponsive STEMI pt. Upon arrival pt alert, SpO2 97% on RA. Per Dr. Particia Nearing RT not needed at this time. RT will continue to monitor and be available as needed.

## 2020-06-15 NOTE — Progress Notes (Signed)
Patient is off the floor at this time so EEG is unable to get to them. Will communicate w team and try to figure out where patient is going/get to patient quicker.

## 2020-06-15 NOTE — Sedation Documentation (Signed)
Handoff with PACU nurses (two at bedside) Patient's right groin site level zero, pulses checked.

## 2020-06-15 NOTE — Transfer of Care (Signed)
Immediate Anesthesia Transfer of Care Note  Patient: Jermaine Soto  Procedure(s) Performed: IR WITH ANESTHESIA (N/A )  Patient Location: PACU  Anesthesia Type:General  Level of Consciousness: unresponsive and Patient remains intubated per anesthesia plan  Airway & Oxygen Therapy: Patient remains intubated per anesthesia plan and Patient placed on Ventilator (see vital sign flow sheet for setting)  Post-op Assessment: Report given to RN, Post -op Vital signs reviewed and stable and Patient moving all extremities  Post vital signs: Reviewed and stable  Last Vitals:  Vitals Value Taken Time  BP    Temp    Pulse    Resp    SpO2      Last Pain:  Vitals:   06/15/20 1327  PainSc: 0-No pain         Complications: No complications documented.

## 2020-06-15 NOTE — Procedures (Signed)
S/P 4v vessel cerebral arteriogram RT CFA approach. Findings. 1.No angio evidence of LVO or MeVO or dissections or intraluminal filling defects. 2.Venous outflow WNLs. S.Ivey Nembhard MD

## 2020-06-15 NOTE — Telephone Encounter (Signed)
Pt wife called in and wanted Dr Shari Prows to know that pt has had a Stroke and has been admitted into [facility]

## 2020-06-15 NOTE — Consult Note (Addendum)
NAME:  Jermaine Soto, MRN:  [id number], DOB:  1946-05-11, LOS: 0 ADMISSION DATE:  06/15/2020, CONSULTATION DATE:  06/15/2020 REFERRING MD:  Dr. Pearlean Brownie, CHIEF COMPLAINT:  AMS  Brief History:  75 year old male presenting from home as a code stroke, LKW 1030 with altered mental status/ agitation, aphasic, and left gaze.  Imaging and angiogram in IR negative for stroke.  Na 121.  Returns to PACU intubated and sedated.   History of Present Illness:  HPI obtained from medical chart review as patient is intubated and sedated on mechanical ventilation.   75 year old male with prior hx significant for Afib on Eliquis, hypothyroidism, prostate cancer s/p laparoscopic radical prostatectomy 2014, and chronic hyponatremia (last 125-128 in 05/2020) presenting from home as a code stroke after being found with altered mental status- aphasic, not following commands, agitated, with left gaze, and hypotensive 70/40, and bradycardic in the 40's, LKW 1030 am.  Patient had complained of dizziness and near syncope around 1000 am.  Was treated with NS bolus with improvement in BP.   Of note, patient had prior complaints of dizziness and hypotension on cardiology appt in December in which his metoprolol was changed to prn with documented HR in the 60's. Also reported 40lbs weight loss over the last 9 months with low TSH, referred to endocrinology  In ER, initial BP 93/77 with HR 60's.  He remained agitated, not following commands, left gaze preference, bilateral arm weakness, bilateral leg weakness, global aphasia , dysarthria  and visual neglect on exam.  Given ativan for agitation without change.  Labs noted for Na 121, 3.9, Cl 89, glucose 160, CO2 20, sCr 1.12, troponin hs 6, normal CBC w/diff.  NIH 20, CT head negative.  CTA head and neck was negative for LVO or significant stenosis in the neck but showed possible hypoenhancing lesion mildly expanding the sella.  Given concern for stroke, he was intubated and taken to IR where  angiogram which was negative.  He returns to the ICU intubated and sedated on propofol and on peripheral phenylephrine.  PCCM asked to admit to ICU.   Past Medical History:  Afib on Eliquis, hypothyroidism, prostate cancer s/p laparoscopic radical prostatectomy 2014, hyponatremia   Significant Hospital Events:  1/5 Admitted Neurology   Consults:  Neuro IR PCCM  Procedures:  1/5 ETT >> 1/5 left radial Aline >>  Significant Diagnostic Tests:  1/5 CTH >> There is no acute intracranial hemorrhage or evidence of acute infarction. ASPECT score is 10.  1/5 CTA head/ neck >> Suboptimal evaluation due to contrast bolus timing.  No large vessel occlusion or hemodynamically significant stenosis in the neck.  No proximal intracranial vessel occlusion.  Possible hypoenhancing lesion mildly expanding the sella. This would be better evaluated on MRI.  Micro Data:  1/5 SARS/ flu >> neg 1/5 BCx 2 >>  Antimicrobials:  1/5 cefazolin  Interim History / Subjective:  In PACU EEG being attached now, then possibly headed to MRI  Objective   Height 5\' 10"  (1.778 m), weight 81.5 kg.       No intake or output data in the 24 hours ending 06/15/20 1433 Filed Weights   06/15/20 1328  Weight: 81.5 kg   Examination: General:  Elderly male in NAD sedated/ intubated on MV HEENT: MM pink/moist, ETT/ OGT, pupils 3/ very sluggish, left lower orbital ecchymosis- appears several days old based on coloring Neuro: sedated, unreponsive CV: rr, NSR- rate 60's, no murmur, right groin site wnl, +2 pulses PULM:  Non labored, not breathing above set rate, CTA, no cough GI: soft, bs quiet, ND, no foley- actively voiding  Extremities: cool/dry, no LE edema  Skin: no rashes  Resolved Hospital Problem list    Assessment & Plan:  Acute Respiratory Insufficiency - s/p intubation for agitation and prior to IR cerebral angiogram. - Wean vent for hopeful extubation after imaging if mental status allows -  Bronchial hygiene. - CXR/ ABG now and follow  Encephalopathy - workup negative for stroke thus far - Neurology following appreciate recs - MRI ordered - check EEG r/o seizure/ seizure precautions - neuro checks - check UA/ CXR/ send BCx, check PCT.  Afebrile/ normal CBC w/diff.  No current s/s of infection - correct metabolic derangements - PAD protocol with propofol/ prn fentanyl - prn ativan for seizure   Hyponatremia - chronic, felt to be 2/2 hypothyroidism - Fluids and supportive care. - Follow BMP.  A.fib (on Eliquis). - Continue home Eliquis. - Hold home Metoprolol given hypotension  Hypotension - felt to be 2/2 dehydration + sedation, fluid responsive - NS at 100 ml/hr - currently off pressors  - goal MAP > 65  Hx Hypothyroidism.  - Continue home Synthroid. - check TSH  Best practice (evaluated daily)  Diet: NPO Pain/Anxiety/Delirium protocol (if indicated): propofol/ prn fentanyl VAP protocol (if indicated): yes DVT prophylaxis: resume Eliquis GI prophylaxis: PPI Glucose control: SSI, goal 140-180 Mobility: PT/OT when appropriate  Disposition: ICU   Goals of Care:  Last date of multidisciplinary goals of care discussion: pending 1/5 Family and staff present:  Summary of discussion:  Follow up goals of care discussion due:  Code Status: full   Labs   CBC: Recent Labs  Lab 06/15/20 1230 06/15/20 1241  WBC 9.0  --   NEUTROABS 4.9  --   HGB 14.5 14.6  HCT 41.4 43.0  MCV 86.4  --   PLT 228  --     Basic Metabolic Panel: Recent Labs  Lab 06/15/20 1230 06/15/20 1241  NA 121* 121*  K 3.9 3.9  CL 89* 88*  CO2 20*  --   GLUCOSE 160* 166*  BUN 16 17  CREATININE 1.12 1.00  CALCIUM 8.8*  --    GFR: Estimated Creatinine Clearance: 66.9 mL/min (by C-G formula based on SCr of 1 mg/dL). Recent Labs  Lab 06/15/20 1230  WBC 9.0    Liver Function Tests: Recent Labs  Lab 06/15/20 1230  AST 24  ALT 11  ALKPHOS 49  BILITOT 0.7  PROT  6.3*  ALBUMIN 3.8   No results for input(s): LIPASE, AMYLASE in the last 168 hours. No results for input(s): AMMONIA in the last 168 hours.  ABG    Component Value Date/Time   TCO2 21 (L) 06/15/2020 1241     Coagulation Profile: No results for input(s): INR, PROTIME in the last 168 hours.  Cardiac Enzymes: No results for input(s): CKTOTAL, CKMB, CKMBINDEX, TROPONINI in the last 168 hours.  HbA1C: No results found for: HGBA1C  CBG: Recent Labs  Lab 06/15/20 1233  GLUCAP 153*    Review of Systems:   Unable to complete as patient is sedated/ intubated   Past Medical History:  He,  has a past medical history of Arthritis and Hypothyroidism.   Surgical History:   Past Surgical History:  Procedure Laterality Date  . ROBOT ASSISTED LAPAROSCOPIC RADICAL PROSTATECTOMY N/A 12/24/2012   Procedure: ROBOTIC ASSISTED LAPAROSCOPIC RADICAL PROSTATECTOMY;  Surgeon: Bernestine Amass, MD;  Location: [facility]S;  Service: Urology;  Laterality: N/A;     Social History:   reports that he has never smoked. He has never used smokeless tobacco. He reports current alcohol use. He reports that he does not use drugs.   Family History:  His family history is not on file.   Allergies No Known Allergies   Home Medications  Prior to Admission medications   Medication Sig Start Date End Date Taking? Authorizing Provider  acetaminophen (TYLENOL) 325 MG tablet Take 650 mg by mouth every 6 (six) hours as needed for mild pain, fever or headache.    [provider]  apixaban (ELIQUIS) 5 MG TABS tablet Take 1 tablet (5 mg total) by mouth 2 (two) times daily. 04/07/20   Freada Bergeron, MD  Cholecalciferol (VITAMIN D3) 125 MCG (5000 UT) CAPS Take 5,000 Units by mouth daily.    [provider]  diphenhydrAMINE HCl (ZZZQUIL) 50 MG/30ML LIQD Take 30 mLs by mouth as needed (sleep).    [provider]  levothyroxine (SYNTHROID) 75 MCG tablet Take 0.5 tablets (37.5 mcg total) by  mouth daily before breakfast. Patient taking differently: Take 37.5-75 mcg by mouth See admin instructions. 28mcg on Tuesday and Thursday 37.29mcg every other day 04/08/20   Freada Bergeron, MD  metoprolol tartrate (LOPRESSOR) 25 MG tablet Take 0.5 tablets (12.5 mg total) by mouth 2 (two) times daily. 04/06/20   Freada Bergeron, MD     Critical care time: 24 mins     Kennieth Rad, ACNP [facility] 06/15/2020, 3:36 PM

## 2020-06-15 NOTE — Progress Notes (Signed)
EEG complete - results pending 

## 2020-06-15 NOTE — Procedures (Signed)
Patient Name: Jermaine Soto  MRN: [id number]  Epilepsy Attending: Charlsie Quest  Referring Physician/Provider: Jimmye Norman, NP Date: 06/15/2020 Duration: 24.33 mins  Patient history: 75 year old male presenting with altered mental status/ agitation, aphasic, and left gaze.  EEG to evaluate for seizure.  Level of alertness: comatose  AEDs during EEG study: Propofol  Technical aspects: This EEG study was done with scalp electrodes positioned according to the 10-20 International system of electrode placement. Electrical activity was acquired at a sampling rate of 500Hz  and reviewed with a high frequency filter of 70Hz  and a low frequency filter of 1Hz . EEG data were recorded continuously and digitally stored.   Description: EEG showed continuous generalized 3 to 6 Hz theta-delta slowing admixed with intermittent generalized  15-18hz  beta activity. Hyperventilation and photic stimulation were not performed.     ABNORMALITY -Continuous slow, generalized  IMPRESSION: This study is suggestive ofsevere diffuse encephalopathy, nonspecific etiology but likely related to sedation. No seizures or epileptiform discharges were seen throughout the recording.  Jermaine Soto 

## 2020-06-15 NOTE — Anesthesia Preprocedure Evaluation (Addendum)
Anesthesia Evaluation  Patient identified by MRN, date of birth, ID band Patient confused  General Assessment Comment:Patient agitated, unable to follow commands prior to induction  Reviewed: H&P , Patient's Chart, lab work & pertinent test results, Unable to perform ROS - Chart review onlyPreop documentation limited or incomplete due to emergent nature of procedure.  Airway Mallampati: II  TM Distance: >3 FB Neck ROM: Full    Dental no notable dental hx.    Pulmonary neg pulmonary ROS,    Pulmonary exam normal breath sounds clear to auscultation       Cardiovascular Normal cardiovascular exam+ dysrhythmias Atrial Fibrillation  Rhythm:Regular Rate:Normal  11/21 1. Left ventricular ejection fraction, by estimation, is 50 to 55%. The  left ventricle has low normal function. The left ventricle has no regional  wall motion abnormalities. Left ventricular diastolic function could not  be evaluated.  2. Right ventricular systolic function is normal. The right ventricular  size is normal. There is normal pulmonary artery systolic pressure.  3. Right atrial size was mildly dilated.  4. The mitral valve is normal in structure. Mild mitral valve  regurgitation. No evidence of mitral stenosis.  5. The aortic valve is normal in structure. Aortic valve regurgitation is  not visualized. No aortic stenosis is present   Neuro/Psych negative neurological ROS  negative psych ROS   GI/Hepatic negative GI ROS, Neg liver ROS,   Endo/Other  Hypothyroidism   Renal/GU negative Renal ROS  negative genitourinary   Musculoskeletal negative musculoskeletal ROS (+)   Abdominal   Peds negative pediatric ROS (+)  Hematology negative hematology ROS (+)   Anesthesia Other Findings   Reproductive/Obstetrics negative OB ROS                            Anesthesia Physical Anesthesia Plan  ASA: III and  emergent  Anesthesia Plan: General   Post-op Pain Management:    Induction: Intravenous and Rapid sequence  PONV Risk Score and Plan: 2 and Ondansetron, Dexamethasone and Treatment may vary due to age or medical condition  Airway Management Planned: Oral ETT  Additional Equipment: Arterial line  Intra-op Plan:   Post-operative Plan: Possible Post-op intubation/ventilation  Informed Consent: I have reviewed the patients History and Physical, chart, labs and discussed the procedure including the risks, benefits and alternatives for the proposed anesthesia with the patient or authorized representative who has indicated his/her understanding and acceptance.     Dental advisory given  Plan Discussed with: CRNA and Surgeon  Anesthesia Plan Comments:         Anesthesia Quick Evaluation

## 2020-06-15 NOTE — ED Provider Notes (Deleted)
1255: Notified my RN Irving Burton patient is en route to IR for intervention. I personally did not meet or evaluate this patient.    Liberty Handy, PA-C 06/15/20 1257

## 2020-06-15 NOTE — Anesthesia Postprocedure Evaluation (Signed)
Anesthesia Post Note  Patient: Jermaine Soto  Procedure(s) Performed: IR WITH ANESTHESIA (N/A )     Patient location during evaluation: SICU Anesthesia Type: General Level of consciousness: sedated Pain management: pain level controlled Vital Signs Assessment: post-procedure vital signs reviewed and stable Respiratory status: patient remains intubated per anesthesia plan Cardiovascular status: stable Postop Assessment: no apparent nausea or vomiting Anesthetic complications: no   No complications documented.  Last Vitals:  Vitals:   06/15/20 1500  SpO2: 100%    Last Pain:  Vitals:   06/15/20 1327  PainSc: 0-No pain                 Swade Shonka S

## 2020-06-15 NOTE — H&P (Signed)
Neurology Consult Note  CC: CODE STROKE Ref MD : Sharen Heck, PA-c  History is obtained from: EMS, pt's wife  HPI: Jermaine Soto is a 75 y.o. male with a PMHx of AF on Eliquis newly diagnosed in October 2021, hypothyroidism, prostate cancer s/p laparoscopic radical prostatectomy in 2014, and hyponatremia s/p ED visit on 05/31/20. Last PCP visit 06/01/20.   Pt presented via EMS to ED as a CODE STROKE. Per EMS and wife, around 10am, pt c/o dizziness and sat in chair. Shortly after, he stated he felt like he was going to faint and became more sleepy, so wife called EMS. No LOC per wife. Upon EMS arrival, patient was unresponsive, but later woke up and became agitated. BP 70/44 HR 40 in the field. BP came up to 150 after 250-500cc IV bolus. HR fluctuated but was never over 55. Pt is on Metoprolol at home for AF. Per EMS, pt followed no commands nor spoke. + left gaze preference. Pt was taken immediately to CT.   On exam upon arrival in ER, pt had fixed left gaze preference and aphasia. Slightly agitated and had to have Ativan 1mg  in CT. He is not following commands. NIH 21, mainly for language.  In review of chart, pt was diagnosed with new onset AF in 10/21 and placed on Eliquis. TTE showed EF 50-55% with no significant valvular disease. He also c/o dizziness with hypotension at home on cardiologist visit on 05/19/20. HR at that time, was stable in the 60s. CHA2DS2-VASc score noted to be 1 at that visit. His Metoprolol was changed to prn at that time.  He had lost weight (186 down to 178 lbs) and his TSH was low with adjustment of Synthoid. Pt claimed 40 lb weight loss over 9 months. He was referred to endocrinology.   LKW: 1000 tpa given?: No, pt on Eliquis. IR Thrombectomy-pt sent to IR for suspicion of LVO.   0 prior to admission  NIHSS:  1a Level of Conscious.: 0 1b LOC Questions: 2 1c LOC Commands: 2 2 Best Gaze: 2 3 Visual: 0 4 Facial Palsy: 0 5a Motor Arm - left: 3 5b Motor Arm -  Right: 0 6a Motor Leg - Left: 3 6b Motor Leg - Right: 0 7 Limb Ataxia: 0 8 Sensory: 0 9 Best Language: 3 10 Dysarthria: 0 11 Extinct. and Inatten.: 2 TOTAL: 20   ROS: unable to complete 14 point ROS due to AMS. Per wife, dizzy at home, some low BPs and HRs, no n/v, mild HA.     Past Medical History:  Diagnosis Date  . Arthritis    hands   . Hypothyroidism    FMHx: + stroke  Social History:  reports that he has never smoked. He has never used smokeless tobacco. He reports current alcohol use. He reports that he does not use drugs.   Prior to Admission medications   Medication Sig Start Date End Date Taking? Authorizing Provider  acetaminophen (TYLENOL) 325 MG tablet Take 650 mg by mouth every 6 (six) hours as needed for mild pain, fever or headache.    [provider]  apixaban (ELIQUIS) 5 MG TABS tablet Take 1 tablet (5 mg total) by mouth 2 (two) times daily. 04/07/20   04/09/20, MD  Cholecalciferol (VITAMIN D3) 125 MCG (5000 UT) CAPS Take 5,000 Units by mouth daily.    [provider]  diphenhydrAMINE HCl (ZZZQUIL) 50 MG/30ML LIQD Take 30 mLs by mouth as needed (sleep).  [provider]  levothyroxine (SYNTHROID) 75 MCG tablet Take 0.5 tablets (37.5 mcg total) by mouth daily before breakfast. Patient taking differently: Take 37.5-75 mcg by mouth See admin instructions. 40mcg on Tuesday and Thursday 37.57mcg every other day 04/08/20   Freada Bergeron, MD  metoprolol tartrate (LOPRESSOR) 25 MG tablet Take 0.5 tablets (12.5 mg total) by mouth 2 (two) times daily. 04/06/20   Freada Bergeron, MD    Exam: Current vital signs: Ht 5\' 10"  (1.778 m)   Wt 81.5 kg   BMI 25.78 kg/m   Physical Exam  Constitutional: Appears well-developed and well-nourished.   Psych: alert, agitated. Eyes: No scleral injection Head: Normocephalic.  Cardiovascular: RRR on tele Respiratory: Effort normal.  GI: Soft.  Skin: WDI  Neuro: Mental  Status: Exam is limited due to pt not following commands.  Patient is awake and alert.  Patient is unable to give a clear and coherent history. Global aphasia and fixed left gaze with right neglect.  Speech/Language: only moans with pain.makes few guttural sounds Cranial Nerves: II: Pupils are equal, round, and reactive to light. III,IV, VI: EOMI can not be tested.  Fixed left gaze deviation.  Blinks to threat on the left but not on the right.  Will not follow gaze. V: no response from patient. VII: Face is grossly symmetric.  VIII: hearing is intact to voice IX, X: pt will not open mouth.  XI: pt not following commands.  XII: pt will not open mouth. Motor: Tone is normal. Bulk is normal. Strength unable to be tested, but is strong in RUE with painful stimulation. LLE withdraws to pain.  No drift or focal weakness. Sensory: intact to RUE with blood draw-just to pain. Intact to bilat feet with stimulation. Can not assess completely due to not following commands.  Deep Tendon Reflexes: Pt too agitated to assess. Plantars: Toes are downgoing bilaterally. Cerebellar: Unable to assess ataxia. No tremors noted.  Gait: deferred.   I have reviewed labs in epic and the pertinent results are: Na 121. Glucose 166  TSH at PCP 12/21 .88.   Images reviewed independently by Dr. Leonie Man and Dr. Dora Sims.   NCT head showed- There is no acute intracranial hemorrhage or evidence of acute infarction. ASPECT score is 10.  MRI brain ordered.   CTA HEAD Anterior circulation: Intracranial internal carotid arteries are patent. Bilateral M1 MCA segments are patent. Proximal anterior cerebral artery is patent. Posterior circulation: Intracranial vertebral arteries, basilar artery, and posterior cerebral arteries are patent. Venous sinuses: Patent as allowed by contrast bolus timing. Possible hypoenhancing lesion mildly expanding the sella. Review of the MIP images confirms the above  findings IMPRESSION: Suboptimal evaluation due to contrast bolus timing. No large vessel occlusion or hemodynamically significant stenosis in the neck. No proximal intracranial vessel occlusion. Possible hypoenhancing lesion mildly expanding the sella. This would be better evaluated on MRI. CTA NECK Aortic arch: Great vessel origins are patent. Right carotid system: Patent. Mild mixed plaque at the common carotid bifurcation. No measurable stenosis. Left carotid system: Patent. Mild mixed plaque at the common carotid bifurcation and proximal ICA. No measurable stenosis. Vertebral arteries: Patent. Skeleton: Degenerative changes of the cervical spine. Other neck: Soft tissue thickening at the base of tongue within the valleculae probably reflects tonsillar tissue. No adenopathy. Upper chest: No apical lung mass. Review of the MIP images confirms the above findings   Assessment: Elster Peterson is a 75 y.o. male PMHx of AF on AC, hypothyroidism, recent weight loss, and  prostate cancer. FMHx of stroke. Pt presented to ED via EMS for CODE STROKE. Per wife and EMS, pt experienced dizziness around 1000 hrs and then began to feel faint. Became sleepy and EMS called. EMS found pt unresponsive with bradycardia and hypotension, which later corrected after IV bolus. Pt woke up afterwards and became agitated. Severe right neglect with fixed left gaze preference. Global aphasia and following no commands. Concern was for LVO given sx. After CT and CTA, CODE IR was called, and pt was taken to IR where angiogram showed no LVO. tPA not given due to pt on AC at home.   Impression: ? etiology of stroke like sx on presentation. Seizures vs metabolic encephalopathy. Na 121, so seizure could be possible.   Dr. Pearlean Brownie spoke with PCCM and they will admit patient and determine further care.  Plan: - ICU admission by PCCM - NIH and neuro checks per ICU protocol.  - SCDs. Defer to PCCM restart of  Eliquis. - NPO.  - NS at 100cc/hr. Follow hyponatremia and watch for seizures.  - seizure precautions. - MRI brain without contrast. - Recommend TTE. - Recommend labs: HbA1c, lipid panel, TSH. - Recommend Statin if LDL > 70 - Will defer to continue Eliquis until IR is finished and verifiy with Dr Pearlean Brownie.  - SBP goal - Permissive hypertension first 24 h < 220/110. Hold home             medications for now. - Telemetry monitoring for arrhythmia/bradycardia.  - Recommend bedside Swallow screen. - Recommend PT/OT/SLP consult. - Recommend metabolic/infectious workup with CBC, CMP, UA with UCx, CXR,   CK, serum lactate and continued metabolic derangement correction.   Further tx per PCCM.    Pt seen by Jimmye Norman, NP and MD. Note/plan to be edited by MD as necessary.  Pager: [phone]  I have personally obtained history,examined this patient, reviewed notes, independently viewed imaging studies, participated in medical decision making and plan of care.ROS completed by me personally and pertinent positives fully documented  I have made any additions or clarifications directly to the above note. Agree with note above.  Patient presented as a code stroke by EMS with unresponsiveness and left gaze preference following initial complaints of dizziness and presyncopal symptoms brief loss of consciousness with brief bradycardia and hypotension responded to fluid challenge.  Patient has A. fib and is on Eliquis and strong clinical suspicion for left MCA stroke however CT angiogram and subsequently emergent diagnostic cerebral catheter angiogram is negative for LVO.  Recommend admit to the intensive care unit and extubated as tolerated.  Check MRI scan of the brain for stroke and EEG for seizures.  Pulmonary critical care team to manage medications and cardiac issues.  Long discussion with patient's wife over the phone as well as with Dr. Corliss Skains Dr. Merrily Pew from critical care team. This patient is  critically ill and at significant risk of neurological worsening, death and care requires constant monitoring of vital signs, hemodynamics,respiratory and cardiac monitoring, extensive review of multiple databases, frequent neurological assessment, discussion with family, other specialists and medical decision making of high complexity.I have made any additions or clarifications directly to the above note.This critical care time does not reflect procedure time, or teaching time or supervisory time of PA/NP/Med Resident etc but could involve care discussion time.  I spent 80 minutes of neurocritical care time  in the care of  this patient.     Delia Heady, MD Medical Director [facility] Pager:  [phone] 06/15/2020 2:48 PM

## 2020-06-16 ENCOUNTER — Inpatient Hospital Stay (HOSPITAL_COMMUNITY): Payer: Medicare Other

## 2020-06-16 ENCOUNTER — Encounter: Payer: Self-pay | Admitting: Family

## 2020-06-16 DIAGNOSIS — I6602 Occlusion and stenosis of left middle cerebral artery: Principal | ICD-10-CM

## 2020-06-16 DIAGNOSIS — E222 Syndrome of inappropriate secretion of antidiuretic hormone: Secondary | ICD-10-CM

## 2020-06-16 DIAGNOSIS — G9341 Metabolic encephalopathy: Secondary | ICD-10-CM

## 2020-06-16 DIAGNOSIS — I361 Nonrheumatic tricuspid (valve) insufficiency: Secondary | ICD-10-CM

## 2020-06-16 DIAGNOSIS — E871 Hypo-osmolality and hyponatremia: Secondary | ICD-10-CM

## 2020-06-16 LAB — ECHOCARDIOGRAM COMPLETE
Area-P 1/2: 5.27 cm2
Height: 70 in
S' Lateral: 3 cm
Weight: 2906.54 oz

## 2020-06-16 LAB — GLUCOSE, CAPILLARY
Glucose-Capillary: 78 mg/dL (ref 70–99)
Glucose-Capillary: 83 mg/dL (ref 70–99)
Glucose-Capillary: 72 mg/dL (ref 70–99)
Glucose-Capillary: 94 mg/dL (ref 70–99)
Glucose-Capillary: 86 mg/dL (ref 70–99)
Glucose-Capillary: 79 mg/dL (ref 70–99)

## 2020-06-16 LAB — LIPID PANEL
Cholesterol: 188 mg/dL (ref 0–200)
HDL: 54 mg/dL (ref >40)
LDL Cholesterol: 113 mg/dL — ABNORMAL HIGH (ref 0–99)
Total CHOL/HDL Ratio: 3.5 RATIO
Triglycerides: 107 mg/dL (ref <150)
VLDL: 21 mg/dL (ref 0–40)

## 2020-06-16 LAB — BASIC METABOLIC PANEL
Anion gap: 10 (ref 5–15)
BUN: 15 mg/dL (ref 8–23)
CO2: 19 mmol/L — ABNORMAL LOW (ref 22–32)
Calcium: 8.6 mg/dL — ABNORMAL LOW (ref 8.9–10.3)
Chloride: 93 mmol/L — ABNORMAL LOW (ref 98–111)
Creatinine, Ser: 1 mg/dL (ref 0.61–1.24)
GFR, Estimated: >60 mL/min (ref >60)
Glucose, Bld: 78 mg/dL (ref 70–99)
Potassium: 4.4 mmol/L (ref 3.5–5.1)
Sodium: 122 mmol/L — ABNORMAL LOW (ref 135–145)

## 2020-06-16 LAB — SODIUM, URINE, RANDOM: Sodium, Ur: 86 mmol/L

## 2020-06-16 LAB — TRIGLYCERIDES: Triglycerides: 108 mg/dL (ref <150)

## 2020-06-16 LAB — SODIUM
Sodium: 117 mmol/L — CL (ref 135–145)
Sodium: 121 mmol/L — ABNORMAL LOW (ref 135–145)
Sodium: 123 mmol/L — ABNORMAL LOW (ref 135–145)

## 2020-06-16 LAB — HEMOGLOBIN A1C
Hgb A1c MFr Bld: 5.7 % — ABNORMAL HIGH (ref 4.8–5.6)
Mean Plasma Glucose: 116.89 mg/dL

## 2020-06-16 LAB — OSMOLALITY: Osmolality: 259 mOsm/kg — ABNORMAL LOW (ref 275–295)

## 2020-06-16 LAB — OSMOLALITY, URINE: Osmolality, Ur: 289 mOsm/kg — ABNORMAL LOW (ref 300–900)

## 2020-06-16 MED ORDER — CHLORHEXIDINE GLUCONATE CLOTH 2 % EX PADS
6.0000 | MEDICATED_PAD | Freq: Every day | CUTANEOUS | Status: DC
  Administered 2020-06-16 – 2020-06-20 (×3): 6 via TOPICAL

## 2020-06-16 MED ORDER — LEVOTHYROXINE SODIUM 75 MCG PO TABS
37.5000 ug | ORAL_TABLET | Freq: Every day | ORAL | Status: DC
  Administered 2020-06-17 – 2020-06-20 (×3): 37.5 ug via ORAL
  Filled 2020-06-16 (×4): qty 1

## 2020-06-16 MED ORDER — PANTOPRAZOLE SODIUM 40 MG PO PACK: 40.0000 mg | PACK | Freq: Every day | ORAL | Status: DC

## 2020-06-16 MED ORDER — IOHEXOL 300 MG/ML  SOLN
75.0000 mL | Freq: Once | INTRAMUSCULAR | Status: AC | PRN
  Administered 2020-06-16: 75 mL via INTRAVENOUS

## 2020-06-16 NOTE — Progress Notes (Addendum)
NAME:  Jermaine Soto, MRN:  [id number], DOB:  Aug 04, 1945, LOS: 1 ADMISSION DATE:  06/15/2020, CONSULTATION DATE:  06/15/2020 REFERRING MD:  Dr. Pearlean Brownie, CHIEF COMPLAINT:  AMS  Brief History:  75 year old male presenting from home as a code stroke, LKW 1030 with altered mental status/ agitation, aphasic, and left gaze.  Imaging and angiogram in IR negative for stroke.  Na 121.  Returns to PACU intubated and sedated.  Spot EEG was negative for seizures  Past Medical History:  Afib on Eliquis, hypothyroidism, prostate cancer s/p laparoscopic radical prostatectomy 2014, hyponatremia   Significant Hospital Events:  1/5 Admitted Neurology   Consults:  Neuro IR PCCM  Procedures:  1/5 ETT >> 1/5 left radial Aline >>  Significant Diagnostic Tests:  1/5 CTH >> There is no acute intracranial hemorrhage or evidence of acute infarction. ASPECT score is 10.  1/5 CTA head/ neck >> Suboptimal evaluation due to contrast bolus timing.  No large vessel occlusion or hemodynamically significant stenosis in the neck.  No proximal intracranial vessel occlusion.  Possible hypoenhancing lesion mildly expanding the sella. This would be better evaluated on MRI.  Micro Data:  1/5 SARS/ flu >> neg 1/5 BCx 2 >> negative 1/5 MRSA PCR negative  Antimicrobials:  1/5 cefazolin  Interim History / Subjective:  Once sedation was stopped, initially patient was agitated later on he started following commands, placed on pressure support trial, he tolerated well and was successfully extubated this afternoon  Objective   Blood pressure (!) 149/74, pulse 67, temperature 98.9 F (37.2 C), temperature source Oral, resp. rate 13, height 5\' 10"  (1.778 m), weight 82.4 kg, SpO2 100 %.    Vent Mode: PSV;CPAP FiO2 (%):  [40 %] 40 % Set Rate:  [14 bmp] 14 bmp Vt Set:  [580 mL] 580 mL PEEP:  [5 cmH20] 5 cmH20 Pressure Support:  [5 cmH20] 5 cmH20 Plateau Pressure:  [13 cmH20-15 cmH20] 15 cmH20   Intake/Output Summary (Last  24 hours) at 06/16/2020 1413 Last data filed at 06/16/2020 1339 Gross per 24 hour  Intake 1519.65 ml  Output 1550 ml  Net -30.35 ml   Filed Weights   06/15/20 1328 06/15/20 1830  Weight: 81.5 kg 82.4 kg   Examination: General: Elderly Caucasian male, lying on the bed HEENT: Atraumatic, normocephalic, MM pink/moist, left lower orbital ecchymosis- appears several days old based on coloring Neuro: Lethargic but opens eyes with vocal stimuli, following simple commands, moving all 4 extremities spontaneously CV: Regular rate and rhythm, no murmur its PULM: Clear to auscultation bilaterally, no wheezes GI: soft, bs quiet, ND, no foley- actively voiding  Extremities: cool/dry, no LE edema  Skin: no rashes  Resolved Hospital Problem list    Assessment & Plan:  Acute metabolic encephalopathy due to hyponatremia Acute on chronic hyponatremia Acute hypoxic respiratory failure  Patient mental status is slightly better than yesterday, he tolerated pressure support trial this morning, successfully extubated after he was following commands His serum sodium continue to remain low, now it is 121 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 Continue home Eliquis. Patient is currently in sinus rhythm Resume metoprolol  Hypothyroidism.  Continue home Synthroid. TSH is 0.8  Best practice (evaluated daily)  Diet: NPO, speech and swallow evaluation Pain/Anxiety/Delirium protocol (  if indicated): N/A VAP protocol (if indicated): N/A DVT prophylaxis: Eliquis GI prophylaxis: PPI Glucose control: SSI, goal 140-180 Mobility: PT/OT when appropriate  Disposition: ICU   Goals of Care:  Last date of multidisciplinary goals of care discussion: 1/6 Family and  staff present: Patient's wife Summary of discussion: Continue full aggressive care Follow up goals of care discussion due: 1/13 Code Status: full   Labs   CBC: Recent Labs  Lab 06/15/20 1230 06/15/20 1241  WBC 9.0  --   NEUTROABS 4.9  --   HGB 14.5 14.6  HCT 41.4 43.0  MCV 86.4  --   PLT 228  --     Basic Metabolic Panel: Recent Labs  Lab 06/15/20 1230 06/15/20 1241 06/15/20 1635 06/15/20 2346 06/16/20 0320 06/16/20 0746  NA 121* 121*  --  117* 122* 121*  K 3.9 3.9  --   --  4.4  --   CL 89* 88*  --   --  93*  --   CO2 20*  --   --   --  19*  --   GLUCOSE 160* 166*  --   --  78  --   BUN 16 17  --   --  15  --   CREATININE 1.12 1.00  --   --  1.00  --   CALCIUM 8.8*  --   --   --  8.6*  --   MG  --   --  2.1  --   --   --   PHOS  --   --  2.7  --   --   --    GFR: Estimated Creatinine Clearance: 66.9 mL/min (by C-G formula based on SCr of 1 mg/dL). Recent Labs  Lab 06/15/20 1230 06/15/20 1635  PROCALCITON  --  <0.10  WBC 9.0  --     Liver Function Tests: Recent Labs  Lab 06/15/20 1230  AST 24  ALT 11  ALKPHOS 49  BILITOT 0.7  PROT 6.3*  ALBUMIN 3.8   No results for input(s): LIPASE, AMYLASE in the last 168 hours. No results for input(s): AMMONIA in the last 168 hours.  ABG    Component Value Date/Time   PHART 7.426 06/15/2020 1620   PCO2ART 32.3 06/15/2020 1620   PO2ART 183 (H) 06/15/2020 1620   HCO3 20.9 06/15/2020 1620   TCO2 21 (L) 06/15/2020 1241   ACIDBASEDEF 2.8 (H) 06/15/2020 1620   O2SAT 99.3 06/15/2020 1620     Coagulation Profile: Recent Labs  Lab 06/15/20 1635  INR 1.4*    Cardiac Enzymes: No results for input(s): CKTOTAL, CKMB, CKMBINDEX, TROPONINI in the last 168 hours.  HbA1C: Hgb A1c MFr Bld  Date/Time Value Ref Range Status  06/16/2020 05:00 AM 5.7 (H) 4.8 - 5.6 % Final    Comment:    (NOTE) Pre diabetes:          5.7%-6.4%  Diabetes:              >6.4%  Glycemic control for   <7.0% adults with diabetes      CBG: Recent Labs  Lab 06/15/20 1928 06/15/20 2329 06/16/20 0345 06/16/20 0756 06/16/20 1153  GLUCAP 93 78 78 83 72    Past Medical History:  He,  has a past medical history of Arthritis and Hypothyroidism.   Surgical History:   Past Surgical History:  Procedure Laterality Date  . IR ANGIO INTRA EXTRACRAN SEL COM CAROTID INNOMINATE BILAT MOD SED  06/15/2020  .  IR ANGIO VERTEBRAL SEL VERTEBRAL BILAT MOD SED  06/15/2020  . RADIOLOGY WITH ANESTHESIA N/A 06/15/2020   Procedure: IR WITH ANESTHESIA;  Surgeon: Radiologist, Medication, MD;  Location: [facility];  Service: Radiology;  Laterality: N/A;  . ROBOT ASSISTED LAPAROSCOPIC RADICAL PROSTATECTOMY N/A 12/24/2012   Procedure: ROBOTIC ASSISTED LAPAROSCOPIC RADICAL PROSTATECTOMY;  Surgeon: Bernestine Amass, MD;  Location: [facility];  Service: Urology;  Laterality: N/A;     Social History:   reports that he has never smoked. He has never used smokeless tobacco. He reports current alcohol use. He reports that he does not use drugs.   Family History:  His family history is not on file.   Allergies Allergies  Allergen Reactions  . Sulfa Antibiotics     Unknown     Home Medications  Prior to Admission medications   Medication Sig Start Date End Date Taking? Authorizing Provider  acetaminophen (TYLENOL) 325 MG tablet Take 650 mg by mouth every 6 (six) hours as needed for mild pain, fever or headache.    [provider]  apixaban (ELIQUIS) 5 MG TABS tablet Take 1 tablet (5 mg total) by mouth 2 (two) times daily. 04/07/20   Freada Bergeron, MD  Cholecalciferol (VITAMIN D3) 125 MCG (5000 UT) CAPS Take 5,000 Units by mouth daily.    [provider]  diphenhydrAMINE HCl (ZZZQUIL) 50 MG/30ML LIQD Take 30 mLs by mouth as needed (sleep).    [provider]  levothyroxine (SYNTHROID) 75 MCG tablet Take 0.5 tablets (37.5 mcg total) by mouth daily before breakfast. Patient taking differently: Take 37.5-75 mcg by mouth See  admin instructions. 21mcg on Tuesday and Thursday 37.51mcg every other day 04/08/20   Freada Bergeron, MD  metoprolol tartrate (LOPRESSOR) 25 MG tablet Take 0.5 tablets (12.5 mg total) by mouth 2 (two) times daily. 04/06/20   Freada Bergeron, MD     Total critical care time: 42 minutes  Performed by: [place] care time was exclusive of separately billable procedures and treating other patients.   Critical care was necessary to treat or prevent imminent or life-threatening deterioration.   Critical care was time spent personally by me on the following activities: development of treatment plan with patient and/or surrogate as well as nursing, discussions with consultants, evaluation of patient's response to treatment, examination of patient, obtaining history from patient or surrogate, ordering and performing treatments and interventions, ordering and review of laboratory studies, ordering and review of radiographic studies, pulse oximetry and re-evaluation of patient's condition.   Jacky Kindle MD [facility] Pager: [phone] Mobile: [phone]

## 2020-06-16 NOTE — Procedures (Signed)
Extubation Procedure Note  Patient Details:   Name: Jermaine Soto DOB: [birth year] MRN: [id number]   Airway Documentation:    Vent end date: 06/16/20 Vent end time: 1242   Evaluation  O2 sats: stable throughout Complications: No apparent complications Patient did tolerate procedure well. Bilateral Breath Sounds: Diminished,Clear   Yes   Patient extubated per MD order. Positive cuff leak. No stridor noted. Vitals are stable on 3L [place]. RN at bedside.  [name] 06/16/2020, 12:42 PM

## 2020-06-16 NOTE — Progress Notes (Signed)
Pt transported to MRI and back with no complications.

## 2020-06-16 NOTE — Progress Notes (Signed)
PT Cancellation Note  Patient Details Name: Jermaine Soto MRN: [id number] DOB: [birth year]   Cancelled Treatment:    Reason Eval/Treat Not Completed: Active bedrest order. Pt has remained on active bedrest orders today. PT will continue to follow and evaluate as appropriate.   Rolm Baptise, PT, DPT   Acute Rehabilitation Department Pager #: [phone]   Gaetana Michaelis 06/16/2020, 5:05 PM

## 2020-06-16 NOTE — Progress Notes (Signed)
OT Cancellation Note  Patient Details Name: Jermaine Soto MRN: [id number] DOB: 03/31/1946   Cancelled Treatment:    Reason Eval/Treat Not Completed: Active bedrest order   Ignacia Palma, OTR/L Acute Rehab Services Pager [phone] Office [phone]    Evette Georges 06/16/2020, 7:53 AM

## 2020-06-16 NOTE — Progress Notes (Signed)
  Echocardiogram 2D Echocardiogram has been performed.  Pieter Partridge 06/16/2020, 9:46 AM

## 2020-06-16 NOTE — Progress Notes (Addendum)
STROKE TEAM PROGRESS NOTE   SUBJECTIVE (INTERVAL HISTORY)  Jermaine Soto is currently intubated, propofol drip is currently on hold for exam.  Patient is drowsy will follow simple commands intermittently, moves all extremities spontaneously.  No family is at bedside.  RN at bedside.  No new  neurological events MRI scan of the brain was negative for any acute abnormality.  EEG showed diffuse slowing due to sedation but no epileptiform activity.  Serum sodium was low at 120.  Patient's wife seem to think that low-sodium has because him to have confusion past.  He is being worked up as an outpatient for weight loss for the last 6 months and low-sodium no definite cause has been found  OBJECTIVE Vitals:   06/16/20 0448 06/16/20 0500 06/16/20 0800 06/16/20 0838  BP: 114/65 (!) 148/70    Pulse: 61 62  75  Resp: 14 14  (!) 21  Temp:   98.9 F (37.2 C)   TempSrc:   Oral   SpO2: 100% 100%  100%  Weight:      Height:         Lipid Panel:  Recent Labs  Lab 06/16/20 0344  CHOL 188  TRIG 107  108  HDL 54  CHOLHDL 3.5  VLDL 21  LDLCALC 423*   HgbA1c:  Lab Results  Component Value Date   HGBA1C 5.7 (H) 06/16/2020   Urine Drug Screen: No results found for: LABOPIA, COCAINSCRNUR, LABBENZ, AMPHETMU, THCU, LABBARB  Alcohol Level     Component Value Date/Time   ETH <10 06/15/2020 1901    IMAGING  CT Brain  There is no acute intracranial hemorrhage or evidence of acute Infarction.  CTA Head/Neck  No large vessel occlusion or hemodynamically significant stenosis in the neck. 2.No proximal intracranial vessel occlusion. 3. Possible hypoenhancing lesion mildly expanding the sella.  MRI Brain  1. No acute intracranial abnormality. 2. Mild chronic microvascular ischemic disease for age. 3. 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  EEG  Generalized slowing, no seizures.  Cerebral  angiogram  1.No angio evidence of LVO or MeVO or dissections or intraluminal filling defects  PHYSICAL EXAM Blood pressure (!) 148/70, pulse 75, temperature 98.9 F (37.2 C), temperature source Oral, resp. rate (!) 21, height 5\' 10"  (1.778 m), weight 82.4 kg, SpO2 100 %.   Physical Exam  Constitutional: intubated, propofol recently turned off Psych: Affect appropriate to situation Eyes: No scleral injection HENT: No OP obstrucion MSK: no joint deformities.  Cardiovascular: Normal rate and regular rhythm.  Respiratory: Effort normal, non-labored breathing GI: Soft.  No distension. There is no tenderness.  Skin: WDI  Neuro: Mental Status: Patient is drowsy, on ventilator.  He is on propofol drip he will open eyes to noxious stimuli.  Will open and close eyes to command.  He does not follow commands consistently but will try to follow gaze. Cranial Nerves: II: Visual Fields are full. Pupils are equal, round, and reactive to light.   III,IV, VI: EOMI without ptosis or diploplia.  V: Facial sensation is symmetric to temperature VII: unable to assess  VIII: hearing is intact to voice X: unable to assess  XII: tongue is midline without atrophy or fasciculations.  Motor: Tone is normal. Bulk is normal. 5/5 strength was present in all four extremities.moving all extremities  Sensory: Sensation is symmetric to light touch and temperature in the arms and legs. Cerebellar: FNF and HKS unable  to assess   ASSESSMENT/PLAN Jermaine Soto is a 75 y.o. male with history of Atrial fibrillation on Eliquis (new diagnosis in 10/21), hypothyroidism prostate cancer s/p laparoscopic radical prostatectomy in 2014, and hyponatremia s/p ED visit on 05/31/20 . He presented to [facility] ED via EMS as a CODE STROKE. Per EMS and wife, around 10am, pt c/o dizziness and sat in chair. Shortly after, he stated he felt like he was going to faint and became more sleepy, so wife called EMS. No LOC per wife. Upon EMS  arrival, patient was unresponsive, but later woke up and became agitated. BP 70/44 HR 40 in the field. BP came up to 150 after 250-500cc IV bolus. HR fluctuated but was never over 55. Pt is on Metoprolol at home for AF. Per EMS, pt followed no commands nor spoke. + left gaze preference. He has had recent weight loss (186 down to 178 lbs) and his TSH was low with adjustment of Synthoid. Pt claimed 40 lb weight loss over 9 months, and referred to endocrine.   Brain imaging reveals no stroke. Unclear reason for acute AMS and gaze preference.  Sodium has been low and unlikely related to presentation   Acute Encephalopathy  Stroke workup negative EEG no seizures, generalized slowing   Hypertension  Stable  [phone]  - restart home metoprolol 12.5mg  BID when able, recently on pressors  .  Long-term BP goal normotensive  Atrial Fibrillation   - Eliquis on hold currently   - EKG NSR   Hyperlipidemia  Home meds: not on home meds  LDL 113, goal < 70  Diabetes type II  HgbA1c 5.7, goal < 7.0    Other Active Problems Acute Respiratory failure related to AMS  -CCM following  - on propofol drip   Hypothyroidism Continue home levothyroxine 37.5 mcg Po daily   Acute on Chronic hyponatremia   - Monitor serial sodiums   - likely due to dehydration   Hospital day # 1  Patient remains intubated and plan to wean off sedation and extubate if tolerated.  No clear explanation for his sudden onset of encephalopathy gaze deviation has been found to low sodium of 120 potentially could do this.  Given patient's history of unexplained weight loss and persistent hyponatremia some cognitive changes as per his wife would evaluate for hidden malignancy with checking CT scan of the chest abdomen and pelvis.  Long discussion with patient's wife and with Dr. Tacy Learn  answer questions. This patient is critically ill and at significant risk of neurological worsening, death and care requires constant  monitoring of vital signs, hemodynamics,respiratory and cardiac monitoring, extensive review of multiple databases, frequent neurological assessment, discussion with family, other specialists and medical decision making of high complexity.I have made any additions or clarifications directly to the above note.This critical care time does not reflect procedure time, or teaching time or supervisory time of PA/NP/Med Resident etc but could involve care discussion time.  I spent 30 minutes of neurocritical care time  in the care of  this patient. Antony Contras, MD     To contact Stroke Continuity provider, please refer to [email or web address] After hours, contact General Neurology

## 2020-06-16 NOTE — Progress Notes (Signed)
SLP Cancellation Note  Patient Details Name: Jermaine Soto   Cancelled treatment:        On vent- will follow.    Royce Macadamia 06/16/2020, 8:31 AM   Breck Coons Lonell Face.Ed Nurse, children's [phone] Office [phone]

## 2020-06-16 NOTE — Progress Notes (Signed)
CRITICAL VALUE ALERT  Critical Value:  Sodium of 117  Date & Time Notied:  06/16/20  0035  Provider Notified:  Dr Arsenio Loader  Orders Received/Actions taken:  Awaiting orders

## 2020-06-16 NOTE — Progress Notes (Signed)
eLink Physician-Brief Progress Note Patient Name: Jermaine Soto DOB: 1945/08/11 MRN: [id number]   Date of Service  06/16/2020  HPI/Events of Note  Hyponatremia - Na+ = 121 --> 121 --> 117. Patient on Propofol IV infusion. Related to increased lipids?  eICU Interventions  Plan: 1. BMP at 3 AM. 2. Send Triglyceride level ordered for 5 AM at 3 AM.     Intervention Category Major Interventions: Electrolyte abnormality - evaluation and management  Aftyn Nott Eugene 06/16/2020, 12:57 AM

## 2020-06-16 NOTE — Progress Notes (Signed)
Referring Physician(s): * No referring provider recorded for this case *  Supervising Physician: Julieanne Cotton  Patient Status:  [facility] - In-pt  Chief Complaint: AMS, aphasia  Subjective: Patient agitated on vent. Follows some commands.  S/p angiogram yesterday with Dr. Corliss Skains with no acute findings.  Neuro-related work-up ongoing.  No family at bedside.   Allergies: Sulfa antibiotics  Medications: Prior to Admission medications   Medication Sig Start Date End Date Taking? Authorizing Provider  apixaban (ELIQUIS) 5 MG TABS tablet Take 1 tablet (5 mg total) by mouth 2 (two) times daily. 04/07/20  Yes Meriam Sprague, MD  Cholecalciferol (VITAMIN D3) 125 MCG (5000 UT) CAPS Take 5,000 Units by mouth daily.   Yes [provider]  diphenhydrAMINE HCl (ZZZQUIL) 50 MG/30ML LIQD Take 30 mLs by mouth as needed (sleep).   Yes [provider]  levothyroxine (SYNTHROID) 75 MCG tablet Take 0.5 tablets (37.5 mcg total) by mouth daily before breakfast. Patient taking differently: Take 37.5-75 mcg by mouth See admin instructions. on Tuesday and Thursday 37.61mcg every other day 04/08/20  Yes Meriam Sprague, MD  acetaminophen (TYLENOL) 325 MG tablet Take 325 mg by mouth every 6 (six) hours as needed for mild pain, fever or headache.    [provider]  metoprolol tartrate (LOPRESSOR) 25 MG tablet Take 0.5 tablets (12.5 mg total) by mouth 2 (two) times daily. Patient not taking: Reported on 06/15/2020 04/06/20   Meriam Sprague, MD     Vital Signs: BP (!) 148/70   Pulse 75   Temp 98.9 F (37.2 C) (Oral)   Resp (!) 21   Ht 5\' 10"  (1.778 m)   Wt 181 lb 10.5 oz (82.4 kg)   SpO2 100%   BMI 26.07 kg/m   Physical Exam  Agitated, intubated Neuro: largely deferred due to agitation, moving all extremities spontaneously. Groin: procedure site stable.  No evidence of hematoma or pseudoaneurysm.   Pulses: distal pulses intact, 2+ DP  right  Imaging: EEG  Result Date: 06/15/2020 08/13/2020, MD     06/15/2020  4:37 PM Patient Name: Jermaine Soto MRN: Wilkie Aye Epilepsy Attending: [id number] Referring Physician/Provider: Charlsie Quest, NP Date: 06/15/2020 Duration: 24.33 mins Patient history: 75 year old male presenting with altered mental status/ agitation, aphasic, and left gaze.  EEG to evaluate for seizure. Level of alertness: comatose AEDs during EEG study: Propofol Technical aspects: This EEG study was done with scalp electrodes positioned according to the 10-20 International system of electrode placement. Electrical activity was acquired at a sampling rate of 500Hz  and reviewed with a high frequency filter of 70Hz  and a low frequency filter of 1Hz . EEG data were recorded continuously and digitally stored. Description: EEG showed continuous generalized 3 to 6 Hz theta-delta slowing admixed with intermittent generalized  15-18hz  beta activity. Hyperventilation and photic stimulation were not performed.   ABNORMALITY -Continuous slow, generalized IMPRESSION: This study is suggestive ofsevere diffuse encephalopathy, nonspecific etiology but likely related to sedation. No seizures or epileptiform discharges were seen throughout the recording. 66   X-ray chest PA or AP  Result Date: 06/15/2020 CLINICAL DATA:  Hypoxia EXAM: CHEST  1 VIEW COMPARISON:  June 01, 2020 FINDINGS: Endotracheal tube tip is 3.7 cm above the carina. No pneumothorax. Lungs are clear. Heart size and pulmonary vascularity are normal. No adenopathy. No bone lesions IMPRESSION: Endotracheal tube as described without pneumothorax. Lungs clear. Heart size normal. Electronically Signed   By:  III M.D.   On: 06/15/2020 15:46   DG Abd 1 View  Result Date: 06/15/2020 CLINICAL DATA:  Enteric catheter placement EXAM: ABDOMEN - 1 VIEW COMPARISON:  None. FINDINGS: Frontal view of the lower chest and upper abdomen demonstrates enteric  catheter tip and side port projecting over the gastric body. Bowel gas pattern is unremarkable. Excreted contrast from previous CT exam. Lung bases are clear. IMPRESSION: 1. Enteric catheter overlying gastric body. Electronically Signed   By: Randa Ngo M.D.   On: 06/15/2020 19:17   MR BRAIN WO CONTRAST  Result Date: 06/16/2020 CLINICAL DATA:  Initial evaluation for acute dizziness, lethargy. EXAM: MRI HEAD WITHOUT CONTRAST TECHNIQUE: Multiplanar, multiecho pulse sequences of the brain and surrounding structures were obtained without intravenous contrast. COMPARISON:  Prior head CT and CTA from 06/15/2020 FINDINGS: Brain: Examination mildly degraded by motion artifact. Cerebral volume within normal limits. Mild chronic microvascular ischemic disease noted involving the periventricular white matter. No abnormal foci of restricted diffusion to suggest acute or subacute ischemia. Gray-white matter differentiation maintained. No encephalomalacia to suggest chronic cortical infarction. No evidence for acute or chronic intracranial hemorrhage. 1.4 x 1.1 x 1.2 cm well-circumscribed cystic lesion seen within the pituitary gland, indeterminate, but 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 Suprasellar extension without significant mass effect on the optic chiasm. No other mass lesion, midline shift or mass effect. No hydrocephalus or extra-axial fluid collection. Vascular: Major intracranial vascular flow voids are well maintained. Skull and upper cervical spine: Craniocervical junction within normal limits. Bone marrow signal intensity normal. No scalp soft tissue abnormality. Sinuses/Orbits: Patient status post bilateral ocular lens replacement. Globes and orbital soft tissues within normal limits. Fluid seen within the nasopharynx. Patient is intubated. Trace bilateral mastoid effusions noted. Other: None. IMPRESSION: 1. No acute intracranial abnormality. 2. Mild chronic microvascular ischemic disease  for age. 3. 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 Electronically Signed   By: Jeannine Boga M.D.   On: 06/16/2020 05:01   CT HEAD CODE STROKE WO CONTRAST  Result Date: 06/15/2020 CLINICAL DATA:  Code stroke. EXAM: CT HEAD WITHOUT CONTRAST TECHNIQUE: Contiguous axial images were obtained from the base of the skull through the vertex without intravenous contrast. COMPARISON:  None. FINDINGS: Brain: There is no acute intracranial hemorrhage, mass effect, or edema. Gray-white differentiation is preserved. Minimal patchy hypoattenuation in the supratentorial white matter is nonspecific but may reflect minor chronic microvascular ischemic changes. Ventricles and sulci are within normal limits in size and configuration. No extra-axial collection. Vascular: No hyperdense vessel. Skull: Unremarkable. Sinuses/Orbits: Mild mucosal thickening.  Orbits are unremarkable. Other: Expanded, empty sella. ASPECTS ([place] Stroke Program Early CT Score) - Ganglionic level infarction (caudate, lentiform nuclei, internal capsule, insula, M1-M3 cortex): 7 - Supraganglionic infarction (M4-M6 cortex): 3 Total score (0-10 with 10 being normal): 10 IMPRESSION: There is no acute intracranial hemorrhage or evidence of acute infarction. ASPECT score is 10. These results were communicated to Dr. Leonie Man at 1:11 pm on 06/15/2020 by text page via the [facility] messaging system. Electronically Signed   By: Macy Mis M.D.   On: 06/15/2020 13:12   CT ANGIO HEAD CODE STROKE  Result Date: 06/15/2020 CLINICAL DATA:  Code stroke follow-up EXAM: CT ANGIOGRAPHY HEAD AND NECK TECHNIQUE: Multidetector CT imaging of the head and neck was performed using the standard protocol during bolus administration of intravenous contrast. Multiplanar CT image reconstructions and MIPs were obtained to  evaluate the vascular anatomy. Carotid stenosis  measurements (when applicable) are obtained utilizing NASCET criteria, using the distal internal carotid diameter as the denominator. CONTRAST:  87mL OMNIPAQUE IOHEXOL 350 MG/ML SOLN COMPARISON:  None. FINDINGS: Poor contrast bolus timing limiting arterial evaluation. CTA NECK Aortic arch: Great vessel origins are patent. Right carotid system: Patent. Mild mixed plaque at the common carotid bifurcation. No measurable stenosis. Left carotid system: Patent. Mild mixed plaque at the common carotid bifurcation and proximal ICA. No measurable stenosis. Vertebral arteries: Patent. Skeleton: Degenerative changes of the cervical spine. Other neck: Soft tissue thickening at the base of tongue within the valleculae probably reflects tonsillar tissue. No adenopathy. Upper chest: No apical lung mass. Review of the MIP images confirms the above findings CTA HEAD Anterior circulation: Intracranial internal carotid arteries are patent. Bilateral M1 MCA segments are patent. Proximal anterior cerebral artery is patent. Posterior circulation: Intracranial vertebral arteries, basilar artery, and posterior cerebral arteries are patent. Venous sinuses: Patent as allowed by contrast bolus timing. Possible hypoenhancing lesion mildly expanding the sella. Review of the MIP images confirms the above findings IMPRESSION: Suboptimal evaluation due to contrast bolus timing. No large vessel occlusion or hemodynamically significant stenosis in the neck. No proximal intracranial vessel occlusion. Possible hypoenhancing lesion mildly expanding the sella. This would be better evaluated on MRI. Initial results were communicated to Dr. Leonie Man at 1:20 pm on 06/15/2020 by text page via the [facility] messaging system. Electronically Signed   By: Macy Mis M.D.   On: 06/15/2020 13:35   CT ANGIO NECK CODE STROKE  Result Date: 06/15/2020 CLINICAL DATA:  Code stroke follow-up EXAM: CT ANGIOGRAPHY HEAD AND NECK TECHNIQUE: Multidetector CT imaging of the  head and neck was performed using the standard protocol during bolus administration of intravenous contrast. Multiplanar CT image reconstructions and MIPs were obtained to evaluate the vascular anatomy. Carotid stenosis measurements (when applicable) are obtained utilizing NASCET criteria, using the distal internal carotid diameter as the denominator. CONTRAST:  50mL OMNIPAQUE IOHEXOL 350 MG/ML SOLN COMPARISON:  None. FINDINGS: Poor contrast bolus timing limiting arterial evaluation. CTA NECK Aortic arch: Great vessel origins are patent. Right carotid system: Patent. Mild mixed plaque at the common carotid bifurcation. No measurable stenosis. Left carotid system: Patent. Mild mixed plaque at the common carotid bifurcation and proximal ICA. No measurable stenosis. Vertebral arteries: Patent. Skeleton: Degenerative changes of the cervical spine. Other neck: Soft tissue thickening at the base of tongue within the valleculae probably reflects tonsillar tissue. No adenopathy. Upper chest: No apical lung mass. Review of the MIP images confirms the above findings CTA HEAD Anterior circulation: Intracranial internal carotid arteries are patent. Bilateral M1 MCA segments are patent. Proximal anterior cerebral artery is patent. Posterior circulation: Intracranial vertebral arteries, basilar artery, and posterior cerebral arteries are patent. Venous sinuses: Patent as allowed by contrast bolus timing. Possible hypoenhancing lesion mildly expanding the sella. Review of the MIP images confirms the above findings IMPRESSION: Suboptimal evaluation due to contrast bolus timing. No large vessel occlusion or hemodynamically significant stenosis in the neck. No proximal intracranial vessel occlusion. Possible hypoenhancing lesion mildly expanding the sella. This would be better evaluated on MRI. Initial results were communicated to Dr. Leonie Man at 1:20 pm on 06/15/2020 by text page via the [facility] messaging system. Electronically Signed    By: Macy Mis M.D.   On: 06/15/2020 13:35   IR ANGIO INTRA EXTRACRAN SEL COM CAROTID INNOMINATE BILAT MOD SED  Result Date: 06/16/2020 CLINICAL DATA:  Left gaze deviation with  aphasia. Suspect left middle cerebral artery occlusion. EXAM: BILATERAL COMMON CAROTID AND INNOMINATE ANGIOGRAPHY COMPARISON:  CT angiogram of the head and neck of June 15, 2020. MEDICATIONS: Heparin none units IV. Ancef 2 g IV antibiotic was administered within 1 hour of the procedure. ANESTHESIA/SEDATION: General anesthesia. CONTRAST:  Isovue 300 approximately 65 mL FLUOROSCOPY TIME:  Fluoroscopy Time: 7 minutes 48 seconds (1139 mGy). COMPLICATIONS: None immediate. TECHNIQUE: Informed written consent was obtained from the patient after a thorough discussion of the procedural risks, benefits and alternatives. All questions were addressed. Maximal Sterile Barrier Technique was utilized including caps, mask, sterile gowns, sterile gloves, sterile drape, hand hygiene and skin antiseptic. A timeout was performed prior to the initiation of the procedure. The right groin was prepped and draped in the usual sterile fashion. Thereafter using modified Seldinger technique, transfemoral access into the right common femoral artery was obtained without difficulty. Over a 0.035 inch guidewire, an 8 French 25 cm Pinnacle sheath was inserted. Through this, and also over 0.035 inch guidewire, a 5 [place] JB 1 catheter was advanced to the aortic arch region and selectively positioned in the right common carotid artery, the right vertebral artery, the left common carotid artery and the left vertebral artery. FINDINGS: The left common carotid arteriogram demonstrates the left external carotid artery and its major branches to be widely patent. The left internal carotid artery at the bulb to the cranial skull base is widely patent. The petrous, cavernous and supraclinoid segments demonstrate patency. The left posterior communicating artery is seen  partially opacifying the left posterior cerebral artery distribution. The left middle cerebral artery and the left anterior cerebral artery opacify into the capillary and venous phases. Magnified oblique images obtained demonstrated no evidence of intraluminal filling defects, occlusions, or of dissections. The right common carotid arteriogram demonstrates the right external carotid artery and its major branches to be widely patent. The right internal carotid artery at the bulb to the cranial skull base is widely patent. The petrous, the cavernous and the supraclinoid segments are patent. The right middle cerebral artery and the right anterior cerebral artery opacify into the capillary and venous phases. Cross-filling via the anterior communicating artery of the left anterior cerebral artery to the A2 segment and distally is seen. The right vertebral artery origin is widely patent. The vessel opacifies to the cranial skull base. More distally the right vertebrobasilar junction and the right posterior-inferior cerebellar artery demonstrate wide patency. The basilar artery, the posterior cerebral arteries, the superior cerebellar arteries and the anterior-inferior cerebellar arteries opacify into the capillary and venous phases. The left vertebral artery origin is widely patent. The vessel demonstrates mild to moderate tortuosity just distal to the origin. More distally the vessel opacifies to the cranial skull base. Patency is maintained of the left vertebrobasilar junction with a hypoplastic left posterior-inferior cerebral artery. The opacified portion of the basilar artery, the posterior cerebral arteries, the superior cerebellar arteries and the anterior-inferior cerebellar arteries are grossly patent. Unopacified blood is seen in the basilar artery from contralateral right vertebral artery. 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 IMPRESSION:  Angiographically no evidence of occlusions, intraluminal filling defects, vasospasm, abnormal arteriovenous shunting or of intracranial aneurysms. Venous outflow within normal limits. PLAN: As per referring MD. Electronically Signed   By: Luanne Bras M.D.   On: 06/15/2020 14:59   IR ANGIO VERTEBRAL SEL VERTEBRAL BILAT MOD SED  Result Date: 06/16/2020 CLINICAL DATA:  Left gaze deviation with aphasia. Suspect left  middle cerebral artery occlusion. EXAM: BILATERAL COMMON CAROTID AND INNOMINATE ANGIOGRAPHY COMPARISON:  CT angiogram of the head and neck of June 15, 2020. MEDICATIONS: Heparin none units IV. Ancef 2 g IV antibiotic was administered within 1 hour of the procedure. ANESTHESIA/SEDATION: General anesthesia. CONTRAST:  Isovue 300 approximately 65 mL FLUOROSCOPY TIME:  Fluoroscopy Time: 7 minutes 48 seconds (1139 mGy). COMPLICATIONS: None immediate. TECHNIQUE: Informed written consent was obtained from the patient after a thorough discussion of the procedural risks, benefits and alternatives. All questions were addressed. Maximal Sterile Barrier Technique was utilized including caps, mask, sterile gowns, sterile gloves, sterile drape, hand hygiene and skin antiseptic. A timeout was performed prior to the initiation of the procedure. The right groin was prepped and draped in the usual sterile fashion. Thereafter using modified Seldinger technique, transfemoral access into the right common femoral artery was obtained without difficulty. Over a 0.035 inch guidewire, an 8 French 25 cm Pinnacle sheath was inserted. Through this, and also over 0.035 inch guidewire, a 5 [place] JB 1 catheter was advanced to the aortic arch region and selectively positioned in the right common carotid artery, the right vertebral artery, the left common carotid artery and the left vertebral artery. FINDINGS: The left common carotid arteriogram demonstrates the left external carotid artery and its major branches to be widely  patent. The left internal carotid artery at the bulb to the cranial skull base is widely patent. The petrous, cavernous and supraclinoid segments demonstrate patency. The left posterior communicating artery is seen partially opacifying the left posterior cerebral artery distribution. The left middle cerebral artery and the left anterior cerebral artery opacify into the capillary and venous phases. Magnified oblique images obtained demonstrated no evidence of intraluminal filling defects, occlusions, or of dissections. The right common carotid arteriogram demonstrates the right external carotid artery and its major branches to be widely patent. The right internal carotid artery at the bulb to the cranial skull base is widely patent. The petrous, the cavernous and the supraclinoid segments are patent. The right middle cerebral artery and the right anterior cerebral artery opacify into the capillary and venous phases. Cross-filling via the anterior communicating artery of the left anterior cerebral artery to the A2 segment and distally is seen. The right vertebral artery origin is widely patent. The vessel opacifies to the cranial skull base. More distally the right vertebrobasilar junction and the right posterior-inferior cerebellar artery demonstrate wide patency. The basilar artery, the posterior cerebral arteries, the superior cerebellar arteries and the anterior-inferior cerebellar arteries opacify into the capillary and venous phases. The left vertebral artery origin is widely patent. The vessel demonstrates mild to moderate tortuosity just distal to the origin. More distally the vessel opacifies to the cranial skull base. Patency is maintained of the left vertebrobasilar junction with a hypoplastic left posterior-inferior cerebral artery. The opacified portion of the basilar artery, the posterior cerebral arteries, the superior cerebellar arteries and the anterior-inferior cerebellar arteries are grossly patent.  Unopacified blood is seen in the basilar artery from contralateral right vertebral artery. 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 IMPRESSION: Angiographically no evidence of occlusions, intraluminal filling defects, vasospasm, abnormal arteriovenous shunting or of intracranial aneurysms. Venous outflow within normal limits. PLAN: As per referring MD. Electronically Signed   By: Luanne Bras M.D.   On: 06/15/2020 14:59    Labs:  CBC: Recent Labs    06/01/20 0941 06/01/20 1631 06/02/20 0855 06/15/20 1230 06/15/20 1241  WBC 6.7 7.7 6.4 9.0  --  HGB 14.6 14.2 14.3 14.5 14.6  HCT 42.7 40.8 41.9 41.4 43.0  PLT 257.0 235 260.0 228  --     COAGS: Recent Labs    06/15/20 1635  INR 1.4*  APTT 49*    BMP: Recent Labs    06/01/20 1631 06/02/20 0855 06/15/20 1230 06/15/20 1241 06/15/20 2346 06/16/20 0320 06/16/20 0746  NA 127* 128* 121* 121* 117* 122* 121*  K 4.1 4.3 3.9 3.9  --  4.4  --   CL 94* 96 89* 88*  --  93*  --   CO2 23 27 20*  --   --  19*  --   GLUCOSE 98 100* 160* 166*  --  78  --   BUN 21 18 16 17   --  15  --   CALCIUM 9.2 9.2 8.8*  --   --  8.6*  --   CREATININE 0.95 1.04 1.12 1.00  --  1.00  --   GFRNONAA >60  --  >60  --   --  >60  --     LIVER FUNCTION TESTS: Recent Labs    06/01/20 0941 06/01/20 1631 06/02/20 0855 06/15/20 1230  BILITOT 0.5 0.5 0.6 0.7  AST 19 21 19 24   ALT 10 12 9 11   ALKPHOS 42 42 41 49  PROT 7.5 7.4 7.4 6.3*  ALBUMIN 4.4 4.4 4.3 3.8    Assessment and Plan: Altered mental status Patient remains intubated, agitated.  S/p angiogram yesterday with Dr. Estanislado Pandy, however no acute findings to explain his presentation.  Procedure site assessed today and found intact without issue.  No further needs in IR at this time.   Electronically Signed: Docia Barrier, PA 06/16/2020, 10:49 AM   I spent a total of 15 Minutes at the the patient's bedside AND on the  patient's hospital floor or unit, greater than 50% of which was counseling/coordinating care for altered mental status.

## 2020-06-17 ENCOUNTER — Inpatient Hospital Stay (HOSPITAL_COMMUNITY): Payer: Medicare Other

## 2020-06-17 DIAGNOSIS — I6300 Cerebral infarction due to thrombosis of unspecified precerebral artery: Secondary | ICD-10-CM | POA: Diagnosis not present

## 2020-06-17 DIAGNOSIS — I6602 Occlusion and stenosis of left middle cerebral artery: Secondary | ICD-10-CM | POA: Diagnosis not present

## 2020-06-17 DIAGNOSIS — G9340 Encephalopathy, unspecified: Secondary | ICD-10-CM | POA: Diagnosis not present

## 2020-06-17 DIAGNOSIS — R4182 Altered mental status, unspecified: Secondary | ICD-10-CM

## 2020-06-17 DIAGNOSIS — E871 Hypo-osmolality and hyponatremia: Secondary | ICD-10-CM | POA: Diagnosis not present

## 2020-06-17 LAB — BASIC METABOLIC PANEL
Anion gap: 12 (ref 5–15)
Anion gap: 9 (ref 5–15)
BUN: 9 mg/dL (ref 8–23)
BUN: 12 mg/dL (ref 8–23)
CO2: 18 mmol/L — ABNORMAL LOW (ref 22–32)
CO2: 22 mmol/L (ref 22–32)
Calcium: 8.5 mg/dL — ABNORMAL LOW (ref 8.9–10.3)
Calcium: 8.7 mg/dL — ABNORMAL LOW (ref 8.9–10.3)
Chloride: 94 mmol/L — ABNORMAL LOW (ref 98–111)
Chloride: 93 mmol/L — ABNORMAL LOW (ref 98–111)
Creatinine, Ser: 0.85 mg/dL (ref 0.61–1.24)
Creatinine, Ser: 0.88 mg/dL (ref 0.61–1.24)
GFR, Estimated: >60 mL/min (ref >60)
GFR, Estimated: >60 mL/min (ref >60)
Glucose, Bld: 83 mg/dL (ref 70–99)
Glucose, Bld: 108 mg/dL — ABNORMAL HIGH (ref 70–99)
Potassium: 4.1 mmol/L (ref 3.5–5.1)
Potassium: 3.8 mmol/L (ref 3.5–5.1)
Sodium: 124 mmol/L — ABNORMAL LOW (ref 135–145)
Sodium: 124 mmol/L — ABNORMAL LOW (ref 135–145)

## 2020-06-17 LAB — GLUCOSE, CAPILLARY
Glucose-Capillary: 101 mg/dL — ABNORMAL HIGH (ref 70–99)
Glucose-Capillary: 111 mg/dL — ABNORMAL HIGH (ref 70–99)
Glucose-Capillary: 73 mg/dL (ref 70–99)
Glucose-Capillary: 82 mg/dL (ref 70–99)
Glucose-Capillary: 92 mg/dL (ref 70–99)

## 2020-06-17 LAB — SODIUM: Sodium: 124 mmol/L — ABNORMAL LOW (ref 135–145)

## 2020-06-17 MED ORDER — APIXABAN 5 MG PO TABS
5.0000 mg | ORAL_TABLET | Freq: Two times a day (BID) | ORAL | Status: DC
  Filled 2020-06-17: qty 1

## 2020-06-17 MED ORDER — METOPROLOL TARTRATE 25 MG PO TABS
12.5000 mg | ORAL_TABLET | Freq: Two times a day (BID) | ORAL | Status: DC
  Filled 2020-06-17: qty 1

## 2020-06-17 MED ORDER — ENSURE ENLIVE PO LIQD
237.0000 mL | Freq: Three times a day (TID) | ORAL | Status: DC
  Administered 2020-06-17 – 2020-06-20 (×8): 237 mL via ORAL
  Filled 2020-06-17: qty 237

## 2020-06-17 MED ORDER — DOCUSATE SODIUM 100 MG PO CAPS
100.0000 mg | ORAL_CAPSULE | Freq: Two times a day (BID) | ORAL | Status: DC
Start: 2020-06-17 — End: 2020-06-20
  Administered 2020-06-17 – 2020-06-20 (×6): 100 mg via ORAL
  Filled 2020-06-17 (×7): qty 1

## 2020-06-17 MED ORDER — APIXABAN 5 MG PO TABS
5.0000 mg | ORAL_TABLET | Freq: Two times a day (BID) | ORAL | Status: DC
Start: 2020-06-17 — End: 2020-06-20
  Administered 2020-06-17 – 2020-06-20 (×7): 5 mg via ORAL
  Filled 2020-06-17 (×6): qty 1

## 2020-06-17 MED ORDER — POLYETHYLENE GLYCOL 3350 17 G PO PACK
17.0000 g | PACK | Freq: Every day | ORAL | Status: DC
  Administered 2020-06-19 – 2020-06-20 (×2): 17 g via ORAL
  Filled 2020-06-17 (×3): qty 1

## 2020-06-17 NOTE — Progress Notes (Addendum)
STROKE TEAM PROGRESS NOTE   SUBJECTIVE (INTERVAL HISTORY)  Jermaine Soto is much improved today.  He has since been extubated, sitting up in bed.  He is awake, alert and appropriate.  He states he feels much better today.  No clear reason for encephalopathy.  CT C/A/P was negative for primary malignancy or metastatic disease.  Recommend repeat EEG     OBJECTIVE Vitals:   06/17/20 0500 06/17/20 0600 06/17/20 0700 06/17/20 0800  BP: 119/70 (!) 144/85 137/69   Pulse: (!) 56 80 72   Resp: 11 19 15    Temp:    97.6 F (36.4 C)  TempSrc:    Oral  SpO2: 100% 100% 100%   Weight:      Height:         Lipid Panel:  Recent Labs  Lab 06/16/20 0344  CHOL 188  TRIG 107  108  HDL 54  CHOLHDL 3.5  VLDL 21  LDLCALC 113*   HgbA1c:  Lab Results  Component Value Date   HGBA1C 5.7 (H) 06/16/2020   Urine Drug Screen: No results found for: LABOPIA, COCAINSCRNUR, LABBENZ, AMPHETMU, THCU, LABBARB  Alcohol Level     Component Value Date/Time   ETH <10 06/15/2020 1901    IMAGING  CT Brain  There is no acute intracranial hemorrhage or evidence of acute Infarction.  CTA Head/Neck  No large vessel occlusion or hemodynamically significant stenosis in the neck. 2.No proximal intracranial vessel occlusion. 3. Possible hypoenhancing lesion mildly expanding the sella.  MRI Brain  1. No acute intracranial abnormality. 2. Mild chronic microvascular ischemic disease for age. 3. 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  EEG  Generalized slowing, no seizures.  - Will order another EEG today   Cerebral angiogram  1.No angio evidence of LVO or MeVO or dissections or intraluminal filling defects  PHYSICAL EXAM Blood pressure (!) 148/70, pulse 75, temperature 98.9 F (37.2 C), temperature source Oral, resp. rate (!) 21, height 5\' 10"  (1.778 m), weight 82.4 kg, SpO2 100 %.   Physical Exam   Constitutional: elderly male in no distress sitting up in bed Psych: Affect appropriate to situation Eyes: No scleral injection HENT: No OP obstrucion MSK: no joint deformities.  Cardiovascular: Normal rate and regular rhythm.  Respiratory: Effort normal, non-labored breathing GI: Soft.  No distension. There is no tenderness.  Skin: WDI  Neuro: Mental Status: Patient is awake, alert and following commands.  Slightly diminished attention registration recall but no aphasia apraxia or dysarthria. Cranial Nerves: II: Visual Fields are full. Pupils are equal, round, and reactive to light.   III,IV, VI: EOMI without ptosis or diploplia.  V: Facial sensation is symmetric to temperature VIII: hearing is intact to voice X: unable to assess  XII: tongue is midline without atrophy or fasciculations.  Motor: Tone is normal. Bulk is normal. 5/5 strength was present in all four extremities.moving all extremities  Sensory: Sensation is symmetric to light touch and temperature in the arms and legs. Cerebellar: FNF and HKS no ataxia   ASSESSMENT/PLAN Mr. Jermaine Soto is a 75 y.o. male with history of Atrial fibrillation on Eliquis (new diagnosis in 10/21), hypothyroidism prostate cancer s/p laparoscopic radical prostatectomy in 2014, and hyponatremia s/p ED visit on 05/31/20 . He presented to [facility] ED via EMS as a CODE STROKE. Per EMS and wife, around 10am, pt c/o dizziness and sat in chair. Shortly after, he stated he felt  like he was going to faint and became more sleepy, so wife called EMS. No LOC per wife. Upon EMS arrival, patient was unresponsive, but later woke up and became agitated. BP 70/44 HR 40 in the field. BP came up to 150 after 250-500cc IV bolus. HR fluctuated but was never over 55. Pt is on Metoprolol at home for AF. Per EMS, pt followed no commands nor spoke. + left gaze preference. He has had recent weight loss (186 down to 178 lbs) and his TSH was low with adjustment of Synthoid. Pt  claimed 40 lb weight loss over 9 months, and referred to endocrine.   Brain imaging reveals no stroke. Unclear reason for acute AMS and gaze preference.  Sodium has been low and unlikely related to presentation   Acute Encephalopathy  Stroke workup negative EEG no seizures, generalized slowing  Will recheck EEG today   Hypertension  Stable  111/59-151/93  - restart home metoprolol 12.5mg  BID when able, recently on pressors  .  Long-term BP goal normotensive  Atrial Fibrillation   - Eliquis restarted   - EKG NSR   Hyperlipidemia  Home meds: not on home meds  LDL 113, goal < 70  Diabetes type II  HgbA1c 5.7, goal < 7.0    Other Active Problems Acute Respiratory failure related to AMS, resolved   -CCM following  - off  propofol drip   Hypothyroidism Continue home levothyroxine 37.5 mcg Po daily   Acute on Chronic hyponatremia   - Monitor serial sodiums   - likely due to dehydration   Hospital day # 2 Continue mobilization out of bed.  Therapy consults.  Repeat EEG to look for seizure activity.  Continue treatment for hyponatremia and hypothyroidism as per critical care team.  Long discussion with patient and care team and answered questions.  Greater than 50% time during this 25-minute visit was spent for counseling and coordination of care about his encephalopathy strokelike presentation and nursing questions.  Stroke team will sign off.  Call for questions.  Discussed with Dr. Donzetta Kohut, MD

## 2020-06-17 NOTE — Evaluation (Signed)
Occupational Therapy Evaluation Patient Details Name: Jermaine Soto MRN: [id number] DOB: [birth year] Today's Date: 06/17/2020    History of Present Illness The pt is a 75 yo male presenting initially unresponsive, noted to be aphasic with L-sided gaze preference. Imaging revealed no acute abnormalities. Extubated 1/6   Clinical Impression   PT admitted with AMS L gaze preference. Pt currently with functional limitiations due to the deficits listed below (see OT problem list). Pt requires cues for lateral visual tracking throughout session. Pt needed cues to rotate neck and use central vision to find bed rail for bed transfer. Pt very unsteady gait and requires total +2 mod (A) hand held (A) with mod cues to void reaching for environmental supports.  Pt will benefit from skilled OT to increase their independence and safety with adls and balance to allow discharge CIR to reach MOD I level .     Follow Up Recommendations  CIR    Equipment Recommendations  Other (comment);3 in 1 bedside commode (RW)    Recommendations for Other Services Rehab consult     Precautions / Restrictions Precautions Precautions: Fall      Mobility Bed Mobility Overal bed mobility: Needs Assistance Bed Mobility: Rolling;Supine to Sit Rolling: Mod assist   Supine to sit: Mod assist     General bed mobility comments: pt needs hand over hand to initiate and mod cues to visually attend to help with progression toward EOB. pt with (A) to elevate trunk from bed surface    Transfers Overall transfer level: Needs assistance   Transfers: Sit to/from Stand;Stand Pivot Transfers Sit to Stand: +2 physical assistance;Mod assist Stand pivot transfers: +2 physical assistance;Mod assist       General transfer comment: reaching for environmental supports and needs cues to progress safely to next surface    Balance Overall balance assessment: Needs assistance Sitting-balance support: No upper extremity  supported;Feet supported Sitting balance-Leahy Scale: Fair     Standing balance support: Bilateral upper extremity supported;During functional activity Standing balance-Leahy Scale: Poor Standing balance comment: posterior bias. pt allowed to LOB with attempts to self correct requiring therapist (A)                           ADL either performed or assessed with clinical judgement   ADL Overall ADL's : Needs assistance/impaired Eating/Feeding: Minimal assistance Eating/Feeding Details (indicate cue type and reason): pt states this doesnt have an opening attempting to open pudding. pt needs visual cues to work on corner first Grooming: Wash/dry hands;Min guard;Standing Grooming Details (indicate cue type and reason): needs mod cues for sequence and to locate soap to use                 Toilet Transfer: Regular Toilet;+2 for physical assistance;+2 for safety/equipment;Moderate assistance;Grab bars Toilet Transfer Details (indicate cue type and reason): pt reaching for environmental supports with mod cues from therapist to avoid at times during transfer. pt holding onto grab bar with progression toward sink due to balance deficits Toileting- Clothing Manipulation and Hygiene: Maximal assistance Toileting - Clothing Manipulation Details (indicate cue type and reason): static standing with second therapist completing peri care     Functional mobility during ADLs: +2 for physical assistance;Moderate assistance General ADL Comments: pt internally motivated for bathroom transfer . pt voiding bowel and bladder. pt does show awareness of purewick and leaving alone after education     Vision   Vision Assessment?: No apparent visual deficits  Perception     Praxis      Pertinent Vitals/Pain Pain Assessment: No/denies pain     Hand Dominance Right   Extremity/Trunk Assessment Upper Extremity Assessment Upper Extremity Assessment: Generalized weakness   Lower  Extremity Assessment Lower Extremity Assessment: Defer to PT evaluation   Cervical / Trunk Assessment Cervical / Trunk Assessment: Normal   Communication Communication Communication: No difficulties   Cognition Arousal/Alertness: Awake/alert Behavior During Therapy: Flat affect Overall Cognitive Status: Impaired/Different from baseline Area of Impairment: Orientation;Following commands;Safety/judgement;Awareness                 Orientation Level: Disoriented to;Situation (reading calendar for date)     Following Commands: Follows one step commands with increased time;Follows multi-step commands inconsistently Safety/Judgement: Decreased awareness of safety;Decreased awareness of deficits Awareness: Intellectual   General Comments: pt fixated on bathroom transfer and needs cues for safety due to attmepts to transfer prior to therapist being prepared   General Comments  VSS    Exercises     Shoulder Instructions      Home Living Family/patient expects to be discharged to:: Private residence Living Arrangements: Spouse/significant other   Type of Home: House Home Access: Stairs to enter [employer] of Steps: 2   Home Layout: Two level;Bed/bath upstairs                   Additional Comments: pt inconsisent in answers and no family at bedside to confirm. Uncertain home setup due to cognition deficits      Prior Functioning/Environment Level of Independence: Independent                 OT Problem List: Decreased activity tolerance;Impaired balance (sitting and/or standing);Decreased coordination;Decreased cognition;Decreased safety awareness;Decreased knowledge of use of DME or AE;Decreased knowledge of precautions      OT Treatment/Interventions: Self-care/ADL training;Therapeutic exercise;Neuromuscular education;Energy conservation;DME and/or AE instruction;Manual therapy;Modalities;Therapeutic activities;Cognitive  remediation/compensation;Patient/family education;Balance training    OT Goals(Current goals can be found in the care plan section) Acute Rehab OT Goals Patient Stated Goal: to go to the bathroom OT Goal Formulation: Patient unable to participate in goal setting Potential to Achieve Goals: Good  OT Frequency: Min 2X/week   Barriers to D/C:            Co-evaluation PT/OT/SLP Co-Evaluation/Treatment: Yes Reason for Co-Treatment: For patient/therapist safety;To address functional/ADL transfers;Necessary to address cognition/behavior during functional activity   OT goals addressed during session: ADL's and self-care;Proper use of Adaptive equipment and DME;Strengthening/ROM      AM-PAC OT "6 Clicks" Daily Activity     Outcome Measure Help from another person eating meals?: A Little Help from another person taking care of personal grooming?: A Little Help from another person toileting, which includes using toliet, bedpan, or urinal?: A Little Help from another person bathing (including washing, rinsing, drying)?: A Little Help from another person to put on and taking off regular upper body clothing?: A Little Help from another person to put on and taking off regular lower body clothing?: A Lot 6 Click Score: 17   End of Session Equipment Utilized During Treatment: Gait belt Nurse Communication: Mobility status;Precautions  Activity Tolerance: Patient tolerated treatment well Patient left: in chair;with call bell/phone within reach;with chair alarm set;with restraints reapplied (posey)  OT Visit Diagnosis: Unsteadiness on feet (R26.81);Muscle weakness (generalized) (M62.81)                Time: [id number] OT Time Calculation (min): 25 min Charges:  OT General Charges $OT  Visit: 1 Visit OT Evaluation $OT Eval Moderate Complexity: 1 Mod   Brynn, OTR/L  Acute Rehabilitation Services Pager: [phone] Office: [phone] .   Jeri Modena 06/17/2020, 1:13 PM

## 2020-06-17 NOTE — Progress Notes (Signed)
EEG complete - results pending 

## 2020-06-17 NOTE — Progress Notes (Signed)
Called pt's wife Aram Beecham tp update on pt's condition and of transfer to 3W10. Answered all questions wife had.

## 2020-06-17 NOTE — Progress Notes (Signed)
Initial Nutrition Assessment  DOCUMENTATION CODES:   Non-severe (moderate) malnutrition in context of chronic illness  INTERVENTION:   Ensure Enlive po BID, each supplement provides 350 kcal and 20 grams of protein  Magic cup TID with meals, each supplement provides 290 kcal and 9 grams of protein  Encourage PO intake    NUTRITION DIAGNOSIS:   Moderate Malnutrition related to chronic illness (hypothyroidism) as evidenced by moderate muscle depletion,moderate fat depletion.  GOAL:   Patient will meet greater than or equal to 90% of their needs  MONITOR:   PO intake,Supplement acceptance  REASON FOR ASSESSMENT:   Malnutrition Screening Tool    ASSESSMENT:   Pt with PMH of AF on Eliquis, hypothyroidism, prostate ca 2014, hyponatremia, and hypothyroidism who was admitted with acute metabolic encephalopathy due to hyponatremia.   Pt discussed during ICU rounds and with RN.  Pt unable to answer all questions during interview. Per chart review pt has lost 40 lb over last 9 months most likely due to supratherapeutic therapy for hypothyroidism PTA. Pt was followed by PCP for this PTA.  Unable to determine nutrition intake PTA. Pt cleared for Regular diet by SLP earlier. Lunch at bedside and he had only ate 1/2 sandwich. Pt agreeable to supplements.     Medications reviewed and include: colace, SSI, miralax Labs reviewed: Na 124    NUTRITION - FOCUSED PHYSICAL EXAM:  Flowsheet Row Most Recent Value  Orbital Region Moderate depletion  Upper Arm Region No depletion  Thoracic and Lumbar Region No depletion  Buccal Region Moderate depletion  Temple Region Moderate depletion  Clavicle Bone Region Mild depletion  Clavicle and Acromion Bone Region Mild depletion  Scapular Bone Region Unable to assess  Dorsal Hand No depletion  Patellar Region Mild depletion  Anterior Thigh Region Moderate depletion  Posterior Calf Region No depletion  Edema (RD Assessment) None  Hair  Reviewed  Eyes Reviewed  Mouth Reviewed  Skin Reviewed  Nails Reviewed       Diet Order:   Diet Order            Diet regular Room service appropriate? Yes; Fluid consistency: Thin  Diet effective now                 EDUCATION NEEDS:   Education needs have been addressed  Skin:  Skin Assessment: Reviewed RN Assessment  Last BM:  unknown  Height:   Ht Readings from Last 1 Encounters:  06/15/20 5\' 10"  (1.778 m)    Weight:   Wt Readings from Last 1 Encounters:  06/15/20 82.4 kg    Ideal Body Weight:  75.4 kg  BMI:  Body mass index is 26.07 kg/m.  Estimated Nutritional Needs:   Kcal:  2100-2300  Protein:  105-120 grams  Fluid:  >2 L/day  Lockie Pares., RD, LDN, CNSC See AMiON for contact information

## 2020-06-17 NOTE — Progress Notes (Signed)
NAME:  Jermaine Soto, MRN:  [id number], DOB:  Jun 10, 1946, LOS: 2 ADMISSION DATE:  06/15/2020, CONSULTATION DATE:  06/15/2020 REFERRING MD:  Dr. Leonie Man, CHIEF COMPLAINT:  AMS  Brief History:  75 year old male presenting from home as a code stroke, LKW 1030 with altered mental status/ agitation, aphasic, and left gaze.  Imaging and angiogram in IR negative for stroke.  Na 121.  Returns to PACU intubated and sedated.  Spot EEG was negative for seizures  Past Medical History:  Afib on Eliquis, hypothyroidism, prostate cancer s/p laparoscopic radical prostatectomy 2014, hyponatremia   Significant Hospital Events:  1/5 Admitted Neurology   Consults:  Neuro IR PCCM  Procedures:  1/5 ETT >> 1/6 1/5 left radial Aline >> 1/7  Significant Diagnostic Tests:  1/5 CTH >> There is no acute intracranial hemorrhage or evidence of acute infarction. ASPECT score is 10.  1/5 CTA head/ neck >> Suboptimal evaluation due to contrast bolus timing.  No large vessel occlusion or hemodynamically significant stenosis in the neck.  No proximal intracranial vessel occlusion.  Possible hypoenhancing lesion mildly expanding the sella. This would be better evaluated on MRI.  Micro Data:  1/5 SARS/ flu >> neg 1/5 BCx 2 >> negative 1/5 MRSA PCR negative  Antimicrobials:  1/5 cefazolin  Interim History / Subjective:   Extubated around 1 PM yesterday. Has done well. Restraints released this morning. Afebrile More lucid, wife at bedside  Objective   Blood pressure (!) 144/85, pulse 80, temperature 97.8 F (36.6 C), temperature source Oral, resp. rate 19, height 5\' 10"  (1.778 m), weight 82.4 kg, SpO2 100 %.    Vent Mode: PSV;CPAP FiO2 (%):  [40 %] 40 % PEEP:  [5 cmH20] 5 cmH20 Pressure Support:  [5 cmH20] 5 cmH20   Intake/Output Summary (Last 24 hours) at 06/17/2020 0818 Last data filed at 06/17/2020 0500 Gross per 24 hour  Intake 44.16 ml  Output 2150 ml  Net -2105.84 ml   Filed Weights   06/15/20 1328  06/15/20 1830  Weight: 81.5 kg 82.4 kg   Examination: Gen:      elderly man, no distress , lying supine HEENT:  EOMI, sclera anicteric, mild pallor , bruising left cheek Neck:     No JVD; no thyromegaly Lungs:    Clear breath sounds bilateral CV:         Regular rate and rhythm; no murmurs Abd:      + bowel sounds; soft, non-tender; no palpable masses, no distension Ext:    No edema; adequate peripheral perfusion Skin:      Warm and dry; no rash Neuro: alert and oriented x 3, nonfocal, power 4/5 all 4 extremities   Resolved Hospital Problem list    Assessment & Plan:  Acute metabolic encephalopathy due to hyponatremia   -Mental status is much improved. 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 currently in sinus rhythm Continue home Eliquis. Resume metoprolol  Hypothyroidism.  Continue home Synthroid. TSH is 0.8  Best practice (evaluated daily)  Diet: NPO, speech and swallow evaluation Pain/Anxiety/Delirium protocol (if indicated): N/A VAP protocol (if indicated): N/A DVT prophylaxis: Eliquis GI prophylaxis: NA Glucose  control: SSI, goal 140-180 Mobility: PT/OT when appropriate  Disposition: ICU , transfer to floor and to triad 1/8   Goals of Care:  Last date of multidisciplinary goals of care discussion: 1/6 Family and staff present: Patient's wife updated at bedside Summary of discussion: Continue full aggressive care Code Status: full   Labs   CBC: Recent Labs  Lab 06/15/20 1230 06/15/20 1241  WBC 9.0  --   NEUTROABS 4.9  --   HGB 14.5 14.6  HCT 41.4 43.0  MCV 86.4   --   PLT 228  --     Basic Metabolic Panel: Recent Labs  Lab 06/15/20 1230 06/15/20 1241 06/15/20 1635 06/15/20 2346 06/16/20 0320 06/16/20 0746 06/16/20 1634 06/16/20 2346  NA 121* 121*  --  117* 122* 121* 123* 124*  K 3.9 3.9  --   --  4.4  --   --   --   CL 89* 88*  --   --  93*  --   --   --   CO2 20*  --   --   --  19*  --   --   --   GLUCOSE 160* 166*  --   --  78  --   --   --   BUN 16 17  --   --  15  --   --   --   CREATININE 1.12 1.00  --   --  1.00  --   --   --   CALCIUM 8.8*  --   --   --  8.6*  --   --   --   MG  --   --  2.1  --   --   --   --   --   PHOS  --   --  2.7  --   --   --   --   --    GFR: Estimated Creatinine Clearance: 66.9 mL/min (by C-G formula based on SCr of 1 mg/dL). Recent Labs  Lab 06/15/20 1230 06/15/20 1635  PROCALCITON  --  <0.10  WBC 9.0  --     Liver Function Tests: Recent Labs  Lab 06/15/20 1230  AST 24  ALT 11  ALKPHOS 49  BILITOT 0.7  PROT 6.3*  ALBUMIN 3.8   No results for input(s): LIPASE, AMYLASE in the last 168 hours. No results for input(s): AMMONIA in the last 168 hours.  ABG    Component Value Date/Time   PHART 7.426 06/15/2020 1620   PCO2ART 32.3 06/15/2020 1620   PO2ART 183 (H) 06/15/2020 1620   HCO3 20.9 06/15/2020 1620   TCO2 21 (L) 06/15/2020 1241   ACIDBASEDEF 2.8 (H) 06/15/2020 1620   O2SAT 99.3 06/15/2020 1620     Coagulation Profile: Recent Labs  Lab 06/15/20 1635  INR 1.4*    Kara Mead MD. FCCP. [facility] See Amion for pager  If no response to pager , please call [phone]  After 7:00 pm call Elink  [phone]   06/17/2020

## 2020-06-17 NOTE — Progress Notes (Signed)
Soft waist belt removed and discontinued at 0800. Family at bedside.

## 2020-06-17 NOTE — Progress Notes (Signed)
Discontinues bilateral soft wrist restraints. Posey belt continued. Will continue to monitor.

## 2020-06-17 NOTE — Evaluation (Signed)
Clinical/Bedside Swallow Evaluation Patient Details  Name: Jermaine Soto MRN: [id number] Date of Birth: 1945-08-12  Today's Date: 06/17/2020 Time: SLP Start Time (ACUTE ONLY): 6734 SLP Stop Time (ACUTE ONLY): 0948 SLP Time Calculation (min) (ACUTE ONLY): 10 min  Past Medical History:  Past Medical History:  Diagnosis Date  . Arthritis    hands   . Hypothyroidism    Past Surgical History:  Past Surgical History:  Procedure Laterality Date  . IR ANGIO INTRA EXTRACRAN SEL COM CAROTID INNOMINATE BILAT MOD SED  06/15/2020  . IR ANGIO VERTEBRAL SEL VERTEBRAL BILAT MOD SED  06/15/2020  . RADIOLOGY WITH ANESTHESIA N/A 06/15/2020   Procedure: IR WITH ANESTHESIA;  Surgeon: Radiologist, Medication, MD;  Location: [place];  Service: Radiology;  Laterality: N/A;  . ROBOT ASSISTED LAPAROSCOPIC RADICAL PROSTATECTOMY N/A 12/24/2012   Procedure: ROBOTIC ASSISTED LAPAROSCOPIC RADICAL PROSTATECTOMY;  Surgeon: Bernestine Amass, MD;  Location: [facility];  Service: Urology;  Laterality: N/A;   HPI:  75 year old male presenting from home with altered mental status/ agitation, aphasic, and left gaze.  Intubated 1 day. Imaging and angiogram in IR negative for stroke.  Na 121. Admission end December for hyponatremia. This admission found to be Encephalopathic due to hyonatremia. PMH: hypothyrooidism, arthritis, prostate cancer.  CXR Mild to moderate bibasilar and SUPERIOR segment LEFT LOWER lobe atelectasis.   Assessment / Plan / Recommendation Clinical Impression  Pt is encephalopathic, followed commands 75% of the time. Difficulty comprehending directions for repositioning. Vocal quality clear and volume appropriate following 1 day intubation. His oropharyngeal efficiency appeared appropriate from subjective observation with straw sips thin and across trials pudding and solid. He will need assist for set up and intake given cognitive impairments with recommendation of regular/thin. Pills whole in puree. No follw up needed for  swallow.  Speech-language-cognitive assessment ordered however will defer to see if it improves with medical management of hyponatremia which suspect it will. He has had issues with low sodium since December. If does not improve, can initiate assessment next week- will check on .  SLP Visit Diagnosis: Dysphagia, unspecified (R13.10)    Aspiration Risk  Mild aspiration risk    Diet Recommendation Regular;Thin liquid   Liquid Administration via: Cup;Straw Medication Administration: Whole meds with puree Supervision: Patient able to self feed;Comment (needs set up) Postural Changes: Seated upright at 90 degrees    Other  Recommendations Oral Care Recommendations: Oral care BID   Follow up Recommendations None      Frequency and Duration            Prognosis        Swallow Study   General HPI: 75 year old male presenting from home with altered mental status/ agitation, aphasic, and left gaze.  Intubated 1 day. Imaging and angiogram in IR negative for stroke.  Na 121. Admission end December for hyponatremia. This admission found to be Encephalopathic due to hyonatremia. PMH: hypothyrooidism, arthritis, prostate cancer.  CXR Mild to moderate bibasilar and SUPERIOR segment LEFT LOWER lobe atelectasis. Type of Study: Bedside Swallow Evaluation Previous Swallow Assessment: none Diet Prior to this Study: NPO Temperature Spikes Noted: No Respiratory Status: Room air History of Recent Intubation: Yes Length of Intubations (days): 1 days Date extubated: 06/16/20 Behavior/Cognition: Alert;Cooperative;Pleasant mood;Confused;Requires cueing Oral Cavity Assessment: Within Functional Limits Oral Care Completed by SLP: Yes Oral Cavity - Dentition: Adequate natural dentition Vision: Functional for self-feeding Self-Feeding Abilities: Able to feed self Patient Positioning: Upright in bed Baseline Vocal Quality: Normal Volitional Cough:  Weak (cognitive based) Volitional Swallow: Able to  elicit    Oral/Motor/Sensory Function Overall Oral Motor/Sensory Function: Within functional limits   Ice Chips Ice chips: Not tested   Thin Liquid Thin Liquid: Within functional limits Presentation: Cup;Straw    Nectar Thick Nectar Thick Liquid: Not tested   Honey Thick Honey Thick Liquid: Not tested   Puree Puree: Within functional limits   Solid     Solid: Within functional limits      Houston Siren 06/17/2020,10:56 AM  Orbie Pyo Colvin Caroli.Ed Risk analyst [phone] Office [phone]

## 2020-06-17 NOTE — Procedures (Signed)
Patient Name: Jermaine Soto  MRN: [id number]  Epilepsy Attending: Lora Havens  Referring Physician/Provider: Beulah Gandy, NP Date: 06/17/2020 Duration: 26.21 mins  Patient history: 75 year old male presenting with altered mental status/ agitation, aphasic,andleft gaze.  EEG to evaluate for seizure.  Level of alertness: awake  AEDs during EEG study: None  Technical aspects: This EEG study was done with scalp electrodes positioned according to the 10-20 International system of electrode placement. Electrical activity was acquired at a sampling rate of 500Hz  and reviewed with a high frequency filter of 70Hz  and a low frequency filter of 1Hz . EEG data were recorded continuously and digitally stored.   Description: EEG showed continuous generalized 5 to 6 Hz theta slowing admixed with intermittent rhythmic generalized 2-3hz  delta activity. Hyperventilation and photic stimulation were not performed.     ABNORMALITY -Continuous slow, generalized - Intermittent rhythmic delta slow, generalized  IMPRESSION: This study is suggestive of moderate diffuse encephalopathy, nonspecific etiology. No seizures or epileptiform discharges were seen throughout the recording.  Carmie Lanpher Barbra Sarks

## 2020-06-17 NOTE — Evaluation (Signed)
Physical Therapy Evaluation Patient Details Name: Jermaine Soto MRN: [id number] DOB: 10-21-1945 Today's Date: 06/17/2020   History of Present Illness  The pt is a 75 yo male presenting initially unresponsive, noted to be aphasic with L-sided gaze preference. Imaging revealed no acute abnormalities. Extubated 1/6. PMH includes: Afib on Eliquis, hypothyroidism, prostate cancer, and hyponatremia.    Clinical Impression  Pt in bed upon arrival of PT, agreeable to evaluation at this time. Prior to admission the pt was independent with mobility and ADLs, lives with his wife. The pt now presents with limitations in functional mobility, coordination, power, strength, and dynamic stability due to above dx, and will continue to benefit from skilled PT to address these deficits. The pt was able to complete initial bed mobility with minimal assist, but required min/modA of 2 to complete OOB transfers and gait due to deficits in stability, coordination, motor planning, and safety awareness. The pt was reaching for UE support with all mobility, but also requires increased cues and assist to safely use assist and navigate in his room. The pt will continue to benefit from skilled PT to address deficits and progress towards independence and safety with mobility to allow for safe d/c when medically stable.      Follow Up Recommendations CIR    Equipment Recommendations   (defer to post acute)    Recommendations for Other Services Rehab consult     Precautions / Restrictions Precautions Precautions: Fall Precaution Comments: poor safety awareness Restrictions Weight Bearing Restrictions: No      Mobility  Bed Mobility Overal bed mobility: Needs Assistance Bed Mobility: Rolling;Supine to Sit Rolling: Mod assist   Supine to sit: Mod assist     General bed mobility comments: pt needs hand over hand to initiate and mod cues to visually attend to help with progression toward EOB. pt with (A) to elevate  trunk from bed surface    Transfers Overall transfer level: Needs assistance Equipment used: 1 person hand held assist Transfers: Sit to/from [facility] Sit to Stand: +2 physical assistance;Mod assist Stand pivot transfers: +2 physical assistance;Mod assist       General transfer comment: reaching for environmental supports and needs cues to progress safely to next surface  Ambulation/Gait Ambulation/Gait assistance: Min assist Gait Distance (Feet): 30 Feet Assistive device: 1 person hand held assist Gait Pattern/deviations: Step-to pattern;Decreased stride length;Shuffle;Staggering left;Staggering right;Drifts right/left Gait velocity: slowed, inconsistent Gait velocity interpretation: <1.31 ft/sec, indicative of household ambulator General Gait Details: large variation in step length, width. reaching for UE support in environment, but with poor functional use (ex: holding grab bar in bathroom but not letting go as he walked away from the bar)  Stairs            Wheelchair Mobility    Modified Rankin (Stroke Patients Only) Modified Rankin (Stroke Patients Only) Pre-Morbid Rankin Score: No symptoms Modified Rankin: Moderately severe disability     Balance Overall balance assessment: Needs assistance Sitting-balance support: No upper extremity supported;Feet supported Sitting balance-Leahy Scale: Fair     Standing balance support: Bilateral upper extremity supported;During functional activity Standing balance-Leahy Scale: Poor Standing balance comment: posterior bias. pt allowed to LOB with attempts to self correct requiring therapist (A)                             Pertinent Vitals/Pain Pain Assessment: No/denies pain    Home Living Family/patient expects to be discharged to:: Private residence  Living Arrangements: Spouse/significant other Available Help at Discharge: Family Type of Home: House Home Access: Stairs to enter    [employer] of Steps: 2 Home Layout: Two level;Bed/bath upstairs   Additional Comments: pt inconsisent in answers and no family at bedside to confirm. Uncertain home setup due to cognition deficits    Prior Function Level of Independence: Independent         Comments: no family at bedside to confirm     Hand Dominance   Dominant Hand: Right    Extremity/Trunk Assessment   Upper Extremity Assessment Upper Extremity Assessment: Defer to OT evaluation    Lower Extremity Assessment Lower Extremity Assessment: Generalized weakness    Cervical / Trunk Assessment Cervical / Trunk Assessment: Normal  Communication   Communication: No difficulties  Cognition Arousal/Alertness: Awake/alert Behavior During Therapy: Flat affect Overall Cognitive Status: Impaired/Different from baseline Area of Impairment: Orientation;Following commands;Safety/judgement;Awareness                 Orientation Level: Disoriented to;Situation (reading claendar for date)     Following Commands: Follows one step commands with increased time;Follows multi-step commands inconsistently Safety/Judgement: Decreased awareness of safety;Decreased awareness of deficits Awareness: Intellectual   General Comments: pt fixated on bathroom transfer and needs cues for safety due to attmepts to transfer prior to therapist being prepared      General Comments General comments (skin integrity, edema, etc.): VSS    Exercises     Assessment/Plan    PT Assessment Patient needs continued PT services  PT Problem List Decreased strength;Decreased activity tolerance;Decreased mobility;Decreased balance;Decreased coordination;Decreased cognition;Decreased safety awareness       PT Treatment Interventions DME instruction;Gait training;Stair training;Functional mobility training;Therapeutic activities;Therapeutic exercise;Balance training;Cognitive remediation;Patient/family education    PT Goals  (Current goals can be found in the Care Plan section)  Acute Rehab PT Goals Patient Stated Goal: to go to the bathroom PT Goal Formulation: With patient Time For Goal Achievement: 06/17/20 Potential to Achieve Goals: Good    Frequency Min 4X/week   Barriers to discharge        Co-evaluation PT/OT/SLP Co-Evaluation/Treatment: Yes Reason for Co-Treatment: For patient/therapist safety;To address functional/ADL transfers;Necessary to address cognition/behavior during functional activity PT goals addressed during session: Mobility/safety with mobility;Balance;Strengthening/ROM OT goals addressed during session: ADL's and self-care;Proper use of Adaptive equipment and DME;Strengthening/ROM       AM-PAC PT "6 Clicks" Mobility  Outcome Measure Help needed turning from your back to your side while in a flat bed without using bedrails?: A Little Help needed moving from lying on your back to sitting on the side of a flat bed without using bedrails?: A Little Help needed moving to and from a bed to a chair (including a wheelchair)?: A Lot Help needed standing up from a chair using your arms (e.g., wheelchair or bedside chair)?: A Lot Help needed to walk in hospital room?: A Lot Help needed climbing 3-5 steps with a railing? : A Lot 6 Click Score: 14    End of Session Equipment Utilized During Treatment: Gait belt Activity Tolerance: Patient tolerated treatment well Patient left: in chair;with call bell/phone within reach;with chair alarm set;with restraints reapplied Nurse Communication: Mobility status PT Visit Diagnosis: Unsteadiness on feet (R26.81);Other abnormalities of gait and mobility (R26.89)    Time: [id number] PT Time Calculation (min) (ACUTE ONLY): 28 min   Charges:   PT Evaluation $PT Eval Moderate Complexity: 1 Mod          Ashyah Quizon F, PT, DPT  Acute Rehabilitation Department Pager #: [phone]  Otho Bellows 06/17/2020, 4:09 PM

## 2020-06-18 DIAGNOSIS — E440 Moderate protein-calorie malnutrition: Secondary | ICD-10-CM | POA: Insufficient documentation

## 2020-06-18 DIAGNOSIS — G9341 Metabolic encephalopathy: Secondary | ICD-10-CM | POA: Diagnosis not present

## 2020-06-18 DIAGNOSIS — E871 Hypo-osmolality and hyponatremia: Secondary | ICD-10-CM | POA: Diagnosis not present

## 2020-06-18 DIAGNOSIS — E039 Hypothyroidism, unspecified: Secondary | ICD-10-CM | POA: Diagnosis not present

## 2020-06-18 DIAGNOSIS — I700 Atherosclerosis of aorta: Secondary | ICD-10-CM

## 2020-06-18 LAB — CBC
HCT: 38.1 % — ABNORMAL LOW (ref 39.0–52.0)
Hemoglobin: 14 g/dL (ref 13.0–17.0)
MCH: 30.7 pg (ref 26.0–34.0)
MCHC: 36.7 g/dL — ABNORMAL HIGH (ref 30.0–36.0)
MCV: 83.6 fL (ref 80.0–100.0)
Platelets: 205 10*3/uL (ref 150–400)
RBC: 4.56 MIL/uL (ref 4.22–5.81)
RDW: 13.6 % (ref 11.5–15.5)
WBC: 7.7 10*3/uL (ref 4.0–10.5)
nRBC: 0 % (ref 0.0–0.2)

## 2020-06-18 LAB — BASIC METABOLIC PANEL
Anion gap: 12 (ref 5–15)
Anion gap: 10 (ref 5–15)
BUN: 14 mg/dL (ref 8–23)
BUN: 18 mg/dL (ref 8–23)
CO2: 22 mmol/L (ref 22–32)
CO2: 23 mmol/L (ref 22–32)
Calcium: 8.7 mg/dL — ABNORMAL LOW (ref 8.9–10.3)
Calcium: 8.6 mg/dL — ABNORMAL LOW (ref 8.9–10.3)
Chloride: 91 mmol/L — ABNORMAL LOW (ref 98–111)
Chloride: 91 mmol/L — ABNORMAL LOW (ref 98–111)
Creatinine, Ser: 0.91 mg/dL (ref 0.61–1.24)
Creatinine, Ser: 0.84 mg/dL (ref 0.61–1.24)
GFR, Estimated: >60 mL/min (ref >60)
GFR, Estimated: >60 mL/min (ref >60)
Glucose, Bld: 98 mg/dL (ref 70–99)
Glucose, Bld: 105 mg/dL — ABNORMAL HIGH (ref 70–99)
Potassium: 4 mmol/L (ref 3.5–5.1)
Potassium: 3.6 mmol/L (ref 3.5–5.1)
Sodium: 125 mmol/L — ABNORMAL LOW (ref 135–145)
Sodium: 124 mmol/L — ABNORMAL LOW (ref 135–145)

## 2020-06-18 LAB — MAGNESIUM: Magnesium: 2 mg/dL (ref 1.7–2.4)

## 2020-06-18 LAB — GLUCOSE, CAPILLARY
Glucose-Capillary: 91 mg/dL (ref 70–99)
Glucose-Capillary: 102 mg/dL — ABNORMAL HIGH (ref 70–99)
Glucose-Capillary: 140 mg/dL — ABNORMAL HIGH (ref 70–99)
Glucose-Capillary: 108 mg/dL — ABNORMAL HIGH (ref 70–99)
Glucose-Capillary: 104 mg/dL — ABNORMAL HIGH (ref 70–99)

## 2020-06-18 LAB — PHOSPHORUS: Phosphorus: 3.5 mg/dL (ref 2.5–4.6)

## 2020-06-18 NOTE — Plan of Care (Signed)

## 2020-06-18 NOTE — Discharge Instructions (Signed)

## 2020-06-18 NOTE — Progress Notes (Signed)
PROGRESS NOTE  Jermaine Soto ZOX:[id number] DOB: 1946/04/08 DOA: 06/15/2020 PCP: Marrian Salvage, FNP  Brief History   75 year old man PMH atrial fibrillation on apixaban diagnosed October 2021 presented as code stroke 1/5, hypotensive, left gaze preference, aphasic.  Admitted to the ICU with severe right side neglect and fixed left gaze preference, global aphasia and the following commands.  CT, CTA and angiogram were unrevealing.  Not given TPA as patient on anticoagulation.  Initial impression was strokelike symptoms, consider seizures versus metabolic encephalopathy; hyponatremia.  Admitted by critical care intubated. Given history of unexplained weight loss, persistent hyponatremia and reported cognitive changes, work-up to exclude occult malignancy was undertaken.  Imaging was unremarkable.  At this point has stabilized and SNF planned.  Acute encephalopathy, gaze preference --Etiology unclear but thought secondary to hyponatremia.  Stroke work-up negative.  EEG nonspecific but no seizures seen.  Repeat EEG moderate diffuse encephalopathy, nonspecific.  MRI brain unremarkable as was CT angiogram and angiogram. --Appears resolved at this point.  Monitor.  Acute on chronic hyponatremia --120s in December 2021.   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 --Continue fluid restriction --Appears asymptomatic.  Atrial fibrillation on apixaban as an outpatient --echo unremarkable --EKG nonacute, showed SR on admit  Hypothyroidism, TSH 0.877 --Continue levothyroxine  Unintentional weight loss as an outpatient last 6 months, moderate malnutrition --Ensure, Magic cup --Work-up here unremarkable.  Liberalize diet.  Follow-up as an outpatient.  Prolonged QT --Check EKG in a.m.  Aortic atherosclerosis --No treatment indicated  Resolved . Acute hypox resp failure, intubated  for airway protection    Disposition Plan:  Discussion: plan for SNF. Medically stable.  Status is: Inpatient  Remains inpatient appropriate because:Inpatient level of care appropriate due to severity of illness   Dispo: The patient is from: Home              Anticipated d/c is to: SNF              Anticipated d/c date is: 2 days              Patient currently is medically stable to d/c.  DVT prophylaxis: SCD's Start: 06/15/20 1325 apixaban (ELIQUIS) tablet 5 mg    Code Status: Full Code Family Communication: wife at bedside  Murray Hodgkins, MD  [facility] Direct contact: see [email or web address] (further directions at bottom of note if needed) 7PM-7AM contact night coverage as at bottom of note 06/18/2020, 5:10 PM  LOS: 3 days   Significant Hospital Events   . 1/5 admitted by critical care for acute encephalopathy   Consults:  . Critical care . Neurology . IR   Procedures:   1/5 ETT >> 1/6  1/5 left radial Aline >> 1/7  Significant Diagnostic Tests:  Marland Kitchen    Micro Data:  .    Antimicrobials:  .   Interval History/Subjective  CC: f/u confusion  Feels better, no complaints at this time. Wife at bedside, reports 40 pound weight loss since March 2021, thought secondary to hyperthyroidism, followed by cardiology and referred to endocrinology as an outpatient with plans for thyroid ultrasound.  Patient eats less than he used to.  Does not eat much salt.  Objective   Vitals:  Vitals:   06/18/20 1127 06/18/20 1606  BP: 130/71 125/63  Pulse: 64 64  Resp: 16 16  Temp: (!) 97.4 F (36.3 C) 97.8 F (36.6 C)  SpO2: 99% 100%  Exam:  Constitutional:   . Appears calm and comfortable sitting in chair ENMT:  . grossly normal hearing  Respiratory:  . CTA bilaterally, no w/r/r.  . Respiratory effort normal.  Cardiovascular:  . RRR, no m/r/g Psychiatric:  . Mental status o Mood, affect appropriate  I have personally reviewed the following:   Today's Data   . CBG stable . Na+ up to 125, Cl  Down to 91 . CBC stable  Scheduled Meds: . apixaban  5 mg Oral BID  . chlorhexidine gluconate (MEDLINE KIT)  15 mL Mouth Rinse BID  . Chlorhexidine Gluconate Cloth  6 each Topical Daily  . docusate sodium  100 mg Oral BID  . feeding supplement  237 mL Oral TID BM  . insulin aspart  0-9 Units Subcutaneous Q4H  . levothyroxine  37.5 mcg Oral QAC breakfast  . polyethylene glycol  17 g Oral Daily   Continuous Infusions:  Principal Problem:   Acute metabolic encephalopathy Active Problems:   Hyponatremia   Atrial fibrillation (HCC)   Middle cerebral artery embolism, left   Malnutrition of moderate degree   Hypothyroidism   Aortic atherosclerosis (HCC)   LOS: 3 days   How to contact the [facility] Attending or Consulting provider 7A - 7P or covering provider during after hours [place], for this patient?  1. Check the care team in [facility] and look for a) attending/consulting [facility] provider listed and b) the [facility] team listed 2. Log into [email or web address] and use 's universal password to access. If you do not have the password, please contact the hospital operator. 3. Locate the [facility] provider you are looking for under [facility] and page to a number that you can be directly reached. 4. If you still have difficulty reaching the provider, please page the [facility] (Director on Call) for the Hospitalists listed on amion for assistance.

## 2020-06-18 NOTE — Progress Notes (Signed)
Patient Wife Aram Beecham at bedside asking to speak to MD about insulin being given. MD notified. No new orders.

## 2020-06-18 NOTE — Progress Notes (Signed)
Physical Therapy Treatment Patient Details Name: Jermaine Soto MRN: [id number] DOB: 12-02-1945 Today's Date: 06/18/2020    History of Present Illness The pt is a 75 yo male presenting initially unresponsive, noted to be aphasic with L-sided gaze preference. Imaging revealed no acute abnormalities. Extubated 1/6. PMH includes: Afib on Eliquis, hypothyroidism, prostate cancer, and hyponatremia.    PT Comments    The pt was able to demo good progress with mobility and PT goals at this time, completing increased hallway ambulation and with improved use of DME. However, the pt continues to present with decreased safety awareness, problem solving, static and dynamic stability, and difficulty sequencing. The pt was able to complete multiple transfers and mobility in tight spaces in his room, but requires significant, repeated, multimodal cues, and assist to maintain stability and navigate around obstacles. The pt will continue to benefit from skilled PT to address these deficits and to facilitate return to improved independence and safety with mobility to allow for safe return to prior hobbies such as gardening with family support.    Follow Up Recommendations  CIR     Equipment Recommendations  Rolling walker with 5" wheels;3in1 (PT)    Recommendations for Other Services       Precautions / Restrictions Precautions Precautions: Fall Precaution Comments: poor safety awareness Restrictions Weight Bearing Restrictions: No    Mobility  Bed Mobility Overal bed mobility: Needs Assistance Bed Mobility: Supine to Sit     Supine to sit: Min assist     General bed mobility comments: pt wife reaching in to give minA to complete elevation of pt trunk from [facility].  Transfers Overall transfer level: Needs assistance Equipment used: Rolling walker (2 wheeled) Transfers: Sit to/from [facility] Sit to Stand: Min assist;Mod assist Stand pivot transfers: Mod assist       General  transfer comment: min/modA to power up with increased assist to steady and for cues for technique/sequencing. pt often needing repeated short, simple cues with tactile cues prior to following commands.  Ambulation/Gait Ambulation/Gait assistance: Min assist Gait Distance (Feet): 150 Feet Assistive device: Rolling walker (2 wheeled) Gait Pattern/deviations: Step-through pattern;Decreased step length - right;Decreased step length - left;Narrow base of support;Trunk flexed Gait velocity: 0.27 m/s Gait velocity interpretation: <1.31 ft/sec, indicative of household ambulator General Gait Details: poor clearance bilaterally, heavy reliance on RW with trunk flexion. pt able to sustain gait, but with significant slowing and increased need for assistance to steer around obstacles    Modified Rankin (Stroke Patients Only) Modified Rankin (Stroke Patients Only) Pre-Morbid Rankin Score: No symptoms Modified Rankin: Moderately severe disability     Balance Overall balance assessment: Needs assistance Sitting-balance support: No upper extremity supported;Feet supported Sitting balance-Leahy Scale: Fair     Standing balance support: No upper extremity supported;During functional activity Standing balance-Leahy Scale: Poor Standing balance comment: minA with reaching during static stance and no UE support, heavy reliance on UE support for gait                            Cognition Arousal/Alertness: Awake/alert Behavior During Therapy: WFL for tasks assessed/performed;Impulsive Overall Cognitive Status: Impaired/Different from baseline Area of Impairment: Following commands;Safety/judgement;Awareness;Problem solving;Memory                     Memory: Decreased short-term memory Following Commands: Follows one step commands with increased time;Follows multi-step commands inconsistently Safety/Judgement: Decreased awareness of safety;Decreased awareness of deficits Awareness:  Intellectual  Problem Solving: Slow processing;Requires verbal cues;Difficulty sequencing General Comments: pt eager to mobilize, slightly impulsive but redirectable. difficulty with all sequencing, processing, and safety awareness. unable to remember room number after 3 min and unable to problem solve way-finding without cues      Exercises Other Exercises Other Exercises: sit-stand from chair with focus on repetitive hand placement on arm rest for safety    General Comments General comments (skin integrity, edema, etc.): VSS  on RA, wife present and supportive      Pertinent Vitals/Pain Pain Assessment: No/denies pain           PT Goals (current goals can now be found in the care plan section) Acute Rehab PT Goals Patient Stated Goal: to go to the bathroom PT Goal Formulation: With patient Time For Goal Achievement: 07/01/20 Potential to Achieve Goals: Good Progress towards PT goals: Progressing toward goals    Frequency    Min 4X/week      PT Plan Current plan remains appropriate       AM-PAC PT "6 Clicks" Mobility   Outcome Measure  Help needed turning from your back to your side while in a flat bed without using bedrails?: A Little Help needed moving from lying on your back to sitting on the side of a flat bed without using bedrails?: A Little Help needed moving to and from a bed to a chair (including a wheelchair)?: A Lot Help needed standing up from a chair using your arms (e.g., wheelchair or bedside chair)?: A Lot Help needed to walk in hospital room?: A Lot Help needed climbing 3-5 steps with a railing? : A Lot 6 Click Score: 14    End of Session Equipment Utilized During Treatment: Gait belt Activity Tolerance: Patient tolerated treatment well Patient left: in chair;with call bell/phone within reach;with chair alarm set;with family/visitor present Nurse Communication: Mobility status PT Visit Diagnosis: Unsteadiness on feet (R26.81);Other abnormalities  of gait and mobility (R26.89)     Time: 5643-3295 PT Time Calculation (min) (ACUTE ONLY): 42 min  Charges:  $Gait Training: 23-37 mins $Therapeutic Activity: 8-22 mins                     Karma Ganja, PT, DPT   Acute Rehabilitation Department Pager #: [phone]   Otho Bellows 06/18/2020, 2:55 PM

## 2020-06-18 NOTE — Hospital Course (Addendum)
75 year old man PMH atrial fibrillation on apixaban diagnosed October 2021 presented as code stroke 1/5, hypotensive, left gaze preference, aphasic.  Admitted to the ICU with severe right side neglect and fixed left gaze preference, global aphasia and the following commands.  CT, CTA and angiogram were unrevealing.  Not given TPA as patient on anticoagulation.  Initial impression was strokelike symptoms, consider seizures versus metabolic encephalopathy; hyponatremia.  Admitted by critical care intubated. Given history of unexplained weight loss, persistent hyponatremia and reported cognitive changes, work-up to exclude occult malignancy was undertaken.  Imaging was unremarkable.  At this point has stabilized and SNF planned.  Acute encephalopathy, gaze preference --Etiology unclear but thought secondary to hyponatremia.  Stroke work-up negative.  EEG nonspecific but no seizures seen.  Repeat EEG moderate diffuse encephalopathy, nonspecific.  MRI brain unremarkable as was CT angiogram and angiogram. --Appears resolved at this point.  Monitor.  Acute on chronic hyponatremia --120s in December 2021.   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 --Continue fluid restriction --Appears asymptomatic.  Atrial fibrillation on apixaban as an outpatient --echo unremarkable --EKG nonacute, showed SR on admit  Hypothyroidism, TSH 0.877 --Continue levothyroxine  Unintentional weight loss as an outpatient last 6 months, moderate malnutrition --Ensure, Magic cup --Work-up here unremarkable.  Liberalize diet.  Follow-up as an outpatient.  Prolonged QT --Check EKG in a.m.  Aortic atherosclerosis --No treatment indicated  Resolved Acute hypox resp failure, intubated for airway protection

## 2020-06-19 DIAGNOSIS — G9341 Metabolic encephalopathy: Secondary | ICD-10-CM | POA: Diagnosis not present

## 2020-06-19 LAB — BASIC METABOLIC PANEL
Anion gap: 10 (ref 5–15)
BUN: 15 mg/dL (ref 8–23)
CO2: 22 mmol/L (ref 22–32)
Calcium: 8.8 mg/dL — ABNORMAL LOW (ref 8.9–10.3)
Chloride: 91 mmol/L — ABNORMAL LOW (ref 98–111)
Creatinine, Ser: 0.82 mg/dL (ref 0.61–1.24)
GFR, Estimated: >60 mL/min (ref >60)
Glucose, Bld: 126 mg/dL — ABNORMAL HIGH (ref 70–99)
Potassium: 3.7 mmol/L (ref 3.5–5.1)
Sodium: 123 mmol/L — ABNORMAL LOW (ref 135–145)

## 2020-06-19 LAB — GLUCOSE, CAPILLARY
Glucose-Capillary: 105 mg/dL — ABNORMAL HIGH (ref 70–99)
Glucose-Capillary: 121 mg/dL — ABNORMAL HIGH (ref 70–99)
Glucose-Capillary: 123 mg/dL — ABNORMAL HIGH (ref 70–99)
Glucose-Capillary: 130 mg/dL — ABNORMAL HIGH (ref 70–99)
Glucose-Capillary: 86 mg/dL (ref 70–99)
Glucose-Capillary: 96 mg/dL (ref 70–99)
Glucose-Capillary: 99 mg/dL (ref 70–99)

## 2020-06-19 NOTE — Progress Notes (Signed)
Patient has orders for novolog, patient and wife refused yesterday, MD notified, see notes. Patient due to receive 1 unit this am, again for CBG 130 patient and wife refused again, MD notified, awaiting new orders

## 2020-06-19 NOTE — Plan of Care (Signed)

## 2020-06-19 NOTE — Progress Notes (Signed)
PROGRESS NOTE  Jermaine Soto  DOB: 1945-06-21  PCP: Marrian Salvage, [place]  DOA: 06/15/2020  LOS: 4 days   Chief Complaint  Patient presents with  . Code Stroke   Brief narrative: Jermaine Soto is a 75 y.o. male with PMH significant for atrial fibrillation on apixaban diagnosed October 2021. Patient presented to the ED on 1/5 as a code stroke. On evaluation in the ED, he was hypertensive, had left gaze preference, severe right-sided neglect and global aphasia. CT, CTA and angiogram were unrevealing.   Not given TPA as patient was already on anticoagulation.   Sodium level was low at 121. He was admitted to ICU with initial impression of acute stroke.  However MRI brain was negative as well.  Symptoms gradually improved. Given history of unexplained weight loss, persistent hyponatremia and reported cognitive changes, work-up to exclude occult malignancy was undertaken.  Imaging was unremarkable.   Subjective: Patient was seen and examined this morning.  Pleasant elderly Caucasian male.  Sitting up in bed.  Not in distress.  Able to give details of the history but seems to get confused intermittently.  Assessment/Plan: Acute encephalopathy -Presenting symptoms as above.  Etiology unclear but thought secondary to hyponatremia. -Stroke work-up negative.  EEG nonspecific but no seizures seen.  Repeat EEG moderate diffuse encephalopathy, nonspecific.   -MRI brain unremarkable as was CT angiogram and angiogram. -Symptoms resolved at this point.   Acute on chronic hyponatremia --120s in December 2021.   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  I would make fluid restriction more strict from 2 L a day to 1500 mill a day. Recent Labs  Lab 06/15/20 2346 06/16/20 0320 06/16/20 0746 06/16/20 1634 06/16/20 2346  06/17/20 0902 06/17/20 2129 06/18/20 0749 06/18/20 2017 06/19/20 0940  NA 117* 122* 121* 123* 124* 124* 124* 125* 124* 123*   Atrial fibrillation on apixaban as an outpatient --echo unremarkable --EKG nonacute, showed SR on admit  Hypothyroidism, TSH 0.877 --Continue levothyroxine  Unintentional weight loss as an outpatient last 6 months, moderate malnutrition --Ensure, Magic cup --Work-up here unremarkable.  Liberalize diet.  Follow-up as an outpatient.  Prolonged QT --Check EKG in a.m.  Mobility: Encourage ambulation. Code Status:   Code Status: Full Code  Nutritional status: Body mass index is 26.07 kg/m. Nutrition Problem: Moderate Malnutrition Etiology: chronic illness (hypothyroidism) Signs/Symptoms: moderate muscle depletion,moderate fat depletion Diet Order            Diet regular Room service appropriate? Yes; Fluid consistency: Thin; Fluid restriction: 2000 mL Fluid  Diet effective now                 DVT prophylaxis: SCD's Start: 06/15/20 1325 apixaban (ELIQUIS) tablet 5 mg   Antimicrobials:  None Fluid: None Consultants: None Family Communication: none at bedside  Status is: Inpatient  Remains inpatient appropriate because: Worsening sodium level   Dispo: The patient is from: Home              Anticipated d/c is to: CIR pending              Anticipated d/c date is: 1 to 2 days              Patient currently is not medically stable to d/c.       Infusions:    Scheduled Meds: . apixaban  5 mg Oral BID  . chlorhexidine gluconate (MEDLINE KIT)  15 mL Mouth Rinse BID  . Chlorhexidine Gluconate Cloth  6 each Topical Daily  . docusate sodium  100 mg Oral BID  . feeding supplement  237 mL Oral TID BM  . levothyroxine  37.5 mcg Oral QAC breakfast  . polyethylene glycol  17 g Oral Daily    Antimicrobials: Anti-infectives (From admission, onward)   Start     Dose/Rate Route Frequency Ordered Stop   06/15/20 1300  ceFAZolin (ANCEF) 2-4  GM/100ML-% IVPB       Note to Pharmacy: Fredric Dine   : cabinet override      06/15/20 1300 06/16/20 0114      PRN meds:    Objective: Vitals:   06/19/20 0754 06/19/20 1147  BP: 118/72 130/71  Pulse: 75 60  Resp: 18 18  Temp: (!) 97.5 F (36.4 C) (!) 97.5 F (36.4 C)  SpO2: 98% 100%    Intake/Output Summary (Last 24 hours) at 06/19/2020 1246 Last data filed at 06/19/2020 0740 Gross per 24 hour  Intake 120 ml  Output 200 ml  Net -80 ml   Filed Weights   06/15/20 1328 06/15/20 1830  Weight: 81.5 kg 82.4 kg   Weight change:  Body mass index is 26.07 kg/m.   Physical Exam: General exam: Pleasant, elderly Caucasian male.  Not in distress Skin: No rashes, lesions or ulcers. HEENT: Atraumatic, normocephalic, no obvious bleeding Lungs: Clear to auscultation bilaterally CVS: Regular rate and rhythm, no murmur GI/Abd soft, nontender, nondistended, bowel sounds present CNS: Alert, awake, oriented x3 Psychiatry: Mood appropriate Extremities: No pedal edema, no calf tenderness  Data Review: I have personally reviewed the laboratory data and studies available.  Recent Labs  Lab 06/15/20 1230 06/15/20 1241 06/18/20 0440  WBC 9.0  --  7.7  NEUTROABS 4.9  --   --   HGB 14.5 14.6 14.0  HCT 41.4 43.0 38.1*  MCV 86.4  --  83.6  PLT 228  --  205   Recent Labs  Lab 06/15/20 1635 06/15/20 2346 06/17/20 0902 06/17/20 2129 06/18/20 0440 06/18/20 0749 06/18/20 2017 06/19/20 0940  NA  --    < > 124* 124*  --  125* 124* 123*  K  --    < > 4.1 3.8  --  4.0 3.6 3.7  CL  --    < > 94* 93*  --  91* 91* 91*  CO2  --    < > 18* 22  --  22 23 22   GLUCOSE  --    < > 83 108*  --  98 105* 126*  BUN  --    < > 9 12  --  14 18 15   CREATININE  --    < > 0.85 0.88  --  0.91 0.84 0.82  CALCIUM  --    < > 8.5* 8.7*  --  8.7* 8.6* 8.8*  MG 2.1  --   --   --  2.0  --   --   --   PHOS 2.7  --   --   --  3.5  --   --   --    < > = values in this interval not displayed.    F/u labs  ordered  Signed, Terrilee Croak, MD [facility] 06/19/2020

## 2020-06-19 NOTE — Progress Notes (Signed)
Inpatient Rehab Admissions Coordinator Note:   Per PT/OT recommendations, pt was screened for CIR candidacy by Gayland Curry, MS, CCC-SLP.  At this time we are recommending an inpatient rehab consult. AC will place consult order per protocol.  Please contact me with questions.    Gayland Curry, [place], [place] Admissions Coordinator [phone] 06/19/20 5:19 PM

## 2020-06-20 DIAGNOSIS — G9341 Metabolic encephalopathy: Secondary | ICD-10-CM | POA: Diagnosis not present

## 2020-06-20 LAB — CULTURE, BLOOD (ROUTINE X 2)
Culture: NO GROWTH
Culture: NO GROWTH

## 2020-06-20 LAB — GLUCOSE, CAPILLARY
Glucose-Capillary: 102 mg/dL — ABNORMAL HIGH (ref 70–99)
Glucose-Capillary: 99 mg/dL (ref 70–99)
Glucose-Capillary: 89 mg/dL (ref 70–99)
Glucose-Capillary: 119 mg/dL — ABNORMAL HIGH (ref 70–99)

## 2020-06-20 LAB — BASIC METABOLIC PANEL
Anion gap: 11 (ref 5–15)
BUN: 19 mg/dL (ref 8–23)
CO2: 25 mmol/L (ref 22–32)
Calcium: 8.8 mg/dL — ABNORMAL LOW (ref 8.9–10.3)
Chloride: 88 mmol/L — ABNORMAL LOW (ref 98–111)
Creatinine, Ser: 0.85 mg/dL (ref 0.61–1.24)
GFR, Estimated: >60 mL/min (ref >60)
Glucose, Bld: 105 mg/dL — ABNORMAL HIGH (ref 70–99)
Potassium: 4.3 mmol/L (ref 3.5–5.1)
Sodium: 124 mmol/L — ABNORMAL LOW (ref 135–145)

## 2020-06-20 NOTE — Progress Notes (Signed)
Patient discharged home with [facility] services and transported by neighbor home

## 2020-06-20 NOTE — Discharge Summary (Signed)
Physician Discharge Summary  Bates Dennington URK:[id number] DOB: [birth year] DOA: 06/15/2020  PCP: Olive Bass, FNP  Admit date: 06/15/2020 Discharge date: 06/20/2020  Admitted From: Home Discharge disposition: Home with PT   Code Status: Full Code  Diet Recommendation: Regular diet with 1.5 L fluid restriction.  Discharge Diagnosis:   Principal Problem:   Acute metabolic encephalopathy Active Problems:   Hyponatremia   Atrial fibrillation (HCC)   Middle cerebral artery embolism, left   Malnutrition of moderate degree   Hypothyroidism   Aortic atherosclerosis [facility])  Chief Complaint  Patient presents with  . Code Stroke   Brief narrative: Jermaine Soto is a 75 y.o. male with PMH significant for atrial fibrillation on apixaban diagnosed October 2021. Patient presented to the ED on 1/5 as a code stroke. On evaluation in the ED, he was hypertensive, had left gaze preference, severe right-sided neglect and global aphasia. CT, CTA and angiogram were unrevealing.   Not given TPA as patient was already on anticoagulation.   Sodium level was low at 121. He was admitted to ICU with initial impression of acute stroke.  However MRI brain was negative as well.  Symptoms gradually improved. Given history of unexplained weight loss, persistent hyponatremia and reported cognitive changes, work-up to exclude occult malignancy was undertaken.  Imaging was unremarkable.   Subjective: Patient was seen and examined this morning.   Sitting up in bed.  Not in distress.  Frustrated to be waiting for CIR bed availability. Sodium level slightly up today.  Hospital course: Acute encephalopathy -Presenting symptoms as above.  Etiology unclear but thought secondary to hyponatremia. -Stroke work-up negative.  EEG nonspecific but no seizures seen.  Repeat EEG moderate diffuse encephalopathy, nonspecific.   -MRI brain unremarkable as was CT angiogram and angiogram. -Symptoms resolved at this point.    Acute on chronic hyponatremia -120s in December 2021.   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   -1/9, I switched him to fluid restriction more strict from 2 L a day to 1500 mill a day.  Sodium slightly up at 124 today.  Continue fluid restriction at home.  Repeat sodium level with PCP in a week. Recent Labs  Lab 06/16/20 0320 06/16/20 0746 06/16/20 1634 06/16/20 2346 06/17/20 0902 06/17/20 2129 06/18/20 0749 06/18/20 2017 06/19/20 0940 06/20/20 0507  NA 122* 121* 123* 124* 124* 124* 125* 124* 123* 124*   Atrial fibrillation on apixaban as an outpatient -echo unremarkable -EKG nonacute, showed SR on admit  Hypothyroidism, TSH 0.877 -Continue levothyroxine  Unintentional weight loss as an outpatient last 6 months, moderate malnutrition -Ensure, Magic cup -Work-up here unremarkable.  Liberalize diet.  Follow-up as an outpatient.  Prolonged QT -Improved.  1/9, EKG with QTC at 455 ms.  Impaired mobility -Seen by PT. CIR was recommended. Patient feels better and wants to go home.  Wife agrees to take him home.  PT ordered.  Stable for discharge to home with PT  Wound care: Incision 12/24/12 N/A (Active)  Date First Assessed/Time First Assessed: 12/24/12 0954   Location: N/A    Assessments 12/24/2012 11:10 AM 12/24/2012  7:55 PM  Dressing Type Gauze (Comment) Gauze (Comment)  Dressing Clean;Dry;Intact Clean;Dry;Intact  Drainage Amount None None     No Linked orders to display     Incision 12/24/12 N/A (Active)  Date First Assessed/Time First Assessed: 12/24/12 8315  Location: N/A    Assessments 12/24/2012 11:10 AM 12/24/2012 12:00 PM  Dressing Type Gauze (Comment) Gauze (Comment)  Dressing Intact Intact  Drainage Description Serosanguineous Serosanguineous     No Linked orders to display     Incision - 6  Ports Abdomen 1: Left;Lateral;Lower 2: Left;Lateral;Upper 3: Umbilicus;Superior 4: Right;Lateral;Lower 5: Right;Medial;Upper 6: Right;Lateral;Upper (Active)  Placement Date: 12/24/12   Location of Ports: Abdomen  Port: 1:  Location Orientation: Left;Lateral;Lower  Port: 2:  Location Orientation: Left;Lateral;Upper  Port: 3:  Location Orientation: Umbilicus;Superior  Port: 4:  Location Orientation: Right;Later    Assessments 12/24/2012  9:54 AM 12/25/2012  7:45 AM  Port 1 Drainage Amount -- Scant  Port 1 Drainage Description -- Serosanguineous  Port 1 Dressing Type Gauze (Comment);Transparent dressing Gauze (Comment);Transparent dressing  Port 1 Dressing Status -- New drainage;Old drainage (marked);Intact  Port 2 Drainage Amount -- None  Port 2 Dressing Type Gauze (Comment);Transparent dressing Gauze (Comment);Transparent dressing  Port 2 Dressing Status -- Clean;Dry;Intact  Port 3 Drainage Amount -- None  Port 3 Dressing Type Gauze (Comment);Transparent dressing Gauze (Comment);Transparent dressing  Port 3 Dressing Status -- Clean;Dry;Intact  Port 4 Drainage Amount -- None  Port 4 Dressing Type Gauze (Comment);Transparent dressing Gauze (Comment);Transparent dressing  Port 4 Dressing Status -- Clean;Dry;Intact  Port 5 Drainage Amount -- None  Port 5 Dressing Type Gauze (Comment);Transparent dressing Gauze (Comment);Transparent dressing  Port 5 Dressing Status -- Clean;Dry;Intact  Port 6 Drainage Amount -- None  Port 6 Dressing Type Gauze (Comment);Transparent dressing Gauze (Comment);Transparent dressing  Port 6 Dressing Status -- Clean;Dry;Intact     No Linked orders to display    Discharge Exam:   Vitals:   06/19/20 2354 06/20/20 0357 06/20/20 0810 06/20/20 1138  BP: 118/69 132/71 115/73 (!) 129/99  Pulse: 71 71 64 64  Resp: 18 18  18   Temp: 98 F (36.7 C) 98.1 F (36.7 C) 98.2 F (36.8 C) 97.8 F (36.6 C)  TempSrc: Oral Oral Oral Oral  SpO2: 96% 98% 97% 100%  Weight:       Height:        Body mass index is 26.07 kg/m.  General exam: Pleasant, elderly Caucasian male.  Not in distress Skin: No rashes, lesions or ulcers. HEENT: Atraumatic, normocephalic, no obvious bleeding Lungs: Clear to auscultation bilaterally CVS: Regular rate and rhythm, no murmur GI/Abd soft, nontender, nondistended, bowel sounds present CNS: Alert, awake, oriented x3.  Slow to respond but consistent and oriented. Psychiatry: Mood appropriate Extremities: No pedal edema, no calf tenderness  Follow ups:   Discharge Instructions    Diet general   Complete by: As directed    1.5 liter / day   Increase activity slowly   Complete by: As directed       Follow-up Information    Marrian Salvage, FNP Follow up.   Specialty: Internal Medicine Contact information: [place] [address] [phone]        Freada Bergeron, MD .   Specialties: Cardiology, Radiology Contact information: [address] [place] [address] [phone]               Recommendations for Outpatient Follow-Up:   1. Follow-up with PCP as an outpatient  Discharge Instructions:  Follow with Primary MD Marrian Salvage, FNP in 7 days   Get CBC/BMP checked in next visit within 1 week by PCP or SNF MD ( we routinely change or add medications that can affect your baseline labs  and fluid status, therefore we recommend that you get the mentioned basic workup next visit with your PCP, your PCP may decide not to get them or add new tests based on their clinical decision)  On your next visit with your PCP, please Get Medicines reviewed and adjusted.  Please request your PCP  to go over all Hospital Tests and Procedure/Radiological results at the follow up, please get all Hospital records sent to your Prim MD by signing hospital release before you go home.  Activity: As tolerated with Full fall precautions use walker/cane & assistance as  needed  For Heart failure patients - Check your Weight same time everyday, if you gain over 2 pounds, or you develop in leg swelling, experience more shortness of breath or chest pain, call your Primary MD immediately. Follow Cardiac Low Salt Diet and 1.5 lit/day fluid restriction.  If you have smoked or chewed Tobacco in the last 2 yrs please stop smoking, stop any regular Alcohol  and or any Recreational drug use.  If you experience worsening of your admission symptoms, develop shortness of breath, life threatening emergency, suicidal or homicidal thoughts you must seek medical attention immediately by calling 911 or calling your MD immediately  if symptoms less severe.  You Must read complete instructions/literature along with all the possible adverse reactions/side effects for all the Medicines you take and that have been prescribed to you. Take any new Medicines after you have completely understood and accpet all the possible adverse reactions/side effects.   Do not drive, operate heavy machinery, perform activities at heights, swimming or participation in water activities or provide baby sitting services if your were admitted for syncope or siezures until you have seen by Primary MD or a Neurologist and advised to do so again.  Do not drive when taking Pain medications.  Do not take more than prescribed Pain, Sleep and Anxiety Medications  Wear Seat belts while driving.   Please note You were cared for by a hospitalist during your hospital stay. If you have any questions about your discharge medications or the care you received while you were in the hospital after you are discharged, you can call the unit and asked to speak with the hospitalist on call if the hospitalist that took care of you is not available. Once you are discharged, your primary care physician will handle any further medical issues. Please note that NO REFILLS for any discharge medications will be authorized once you are  discharged, as it is imperative that you return to your primary care physician (or establish a relationship with a primary care physician if you do not have one) for your aftercare needs so that they can reassess your need for medications and monitor your lab values.    Allergies as of 06/20/2020      Reactions   Sulfa Antibiotics    Unknown      Medication List    STOP taking these medications   metoprolol tartrate 25 MG tablet Commonly known as: LOPRESSOR     TAKE these medications   acetaminophen 325 MG tablet Commonly known as: TYLENOL Take 325 mg by mouth every 6 (six) hours as needed for mild pain, fever or headache.   apixaban 5 MG Tabs tablet Commonly known as: Eliquis Take 1 tablet (5 mg total) by mouth 2 (two) times daily.   levothyroxine 75 MCG tablet Commonly known as: Synthroid Take 0.5 tablets (37.5 mcg total) by mouth daily before breakfast. What changed:   how  much to take  when to take this  additional instructions   Vitamin D3 125 MCG (5000 UT) Caps Take 5,000 Units by mouth daily.   ZzzQuil 50 MG/30ML Liqd Generic drug: diphenhydrAMINE HCl Take 30 mLs by mouth as needed (sleep).       Time coordinating discharge: 35 minutes  The results of significant diagnostics from this hospitalization (including imaging, microbiology, ancillary and laboratory) are listed below for reference.    Procedures and Diagnostic Studies:   EEG  Result Date: 06/15/2020 Lora Havens, MD     06/15/2020  4:37 PM Patient Name: Jermaine Soto MRN: [id number] Epilepsy Attending: Lora Havens Referring Physician/Provider: Clance Boll, NP Date: 06/15/2020 Duration: 24.33 mins Patient history: 75 year old male presenting with altered mental status/ agitation, aphasic, and left gaze.  EEG to evaluate for seizure. Level of alertness: comatose AEDs during EEG study: Propofol Technical aspects: This EEG study was done with scalp electrodes positioned according to the 10-20  International system of electrode placement. Electrical activity was acquired at a sampling rate of 500Hz  and reviewed with a high frequency filter of 70Hz  and a low frequency filter of 1Hz . EEG data were recorded continuously and digitally stored. Description: EEG showed continuous generalized 3 to 6 Hz theta-delta slowing admixed with intermittent generalized  15-18hz  beta activity. Hyperventilation and photic stimulation were not performed.   ABNORMALITY -Continuous slow, generalized IMPRESSION: This study is suggestive ofsevere diffuse encephalopathy, nonspecific etiology but likely related to sedation. No seizures or epileptiform discharges were seen throughout the recording. Lora Havens   X-ray chest PA or AP  Result Date: 06/15/2020 CLINICAL DATA:  Hypoxia EXAM: CHEST  1 VIEW COMPARISON:  June 01, 2020 FINDINGS: Endotracheal tube tip is 3.7 cm above the carina. No pneumothorax. Lungs are clear. Heart size and pulmonary vascularity are normal. No adenopathy. No bone lesions IMPRESSION: Endotracheal tube as described without pneumothorax. Lungs clear. Heart size normal. Electronically Signed   By: Lowella Grip III M.D.   On: 06/15/2020 15:46   DG Abd 1 View  Result Date: 06/15/2020 CLINICAL DATA:  Enteric catheter placement EXAM: ABDOMEN - 1 VIEW COMPARISON:  None. FINDINGS: Frontal view of the lower chest and upper abdomen demonstrates enteric catheter tip and side port projecting over the gastric body. Bowel gas pattern is unremarkable. Excreted contrast from previous CT exam. Lung bases are clear. IMPRESSION: 1. Enteric catheter overlying gastric body. Electronically Signed   By: Randa Ngo M.D.   On: 06/15/2020 19:17   CT CHEST W CONTRAST  Result Date: 06/16/2020 CLINICAL DATA:  75 year old male with unexplained weight loss and persistent hyponatremia. EXAM: CT CHEST, ABDOMEN, AND PELVIS WITH CONTRAST TECHNIQUE: Multidetector CT imaging of the chest, abdomen and pelvis was  performed following the standard protocol during bolus administration of intravenous contrast. CONTRAST:  14mL OMNIPAQUE IOHEXOL 300 MG/ML  SOLN COMPARISON:  06/15/2020 chest radiograph FINDINGS: CT CHEST FINDINGS Cardiovascular: UPPER limits normal heart size noted. Coronary artery atherosclerotic calcification involving the LEFT main and LAD noted. Mild thoracic aortic atherosclerotic calcifications noted without aneurysm. No pericardial effusion. Mediastinum/Nodes: No enlarged mediastinal, hilar, or axillary lymph nodes. Thyroid gland, trachea, and esophagus demonstrate no significant findings. Lungs/Pleura: Mild to moderate atelectasis in both lung bases and in the SUPERIOR segment LEFT LOWER lobe noted. There is no evidence of airspace disease, consolidation, mass, suspicious nodule, pleural effusion or pneumothorax. Musculoskeletal: No acute or suspicious bony abnormalities are noted. CT ABDOMEN PELVIS FINDINGS Hepatobiliary: The liver and gallbladder are unremarkable.  No biliary dilatation. Pancreas: Unremarkable Spleen: Unremarkable Adrenals/Urinary Tract: The kidneys, adrenal glands and bladder are unremarkable. Stomach/Bowel: Stomach is within normal limits. Appendix appears normal. No evidence of bowel wall thickening, distention, or inflammatory changes. Vascular/Lymphatic: Aortic atherosclerosis. No enlarged abdominal or pelvic lymph nodes. Reproductive: Prostatectomy. Other: No ascites, focal collection or pneumoperitoneum. Musculoskeletal: No acute or suspicious bony abnormalities. 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 IMPRESSION: 1. No evidence of primary malignancy or metastatic disease. 2. Mild to moderate bibasilar and SUPERIOR segment LEFT LOWER lobe atelectasis. 3. Coronary artery disease. 4. Aortic Atherosclerosis (ICD10-I70.0). Electronically Signed   By: Margarette Canada M.D.   On: 06/16/2020 19:21   MR BRAIN WO CONTRAST  Result Date:  06/16/2020 CLINICAL DATA:  Initial evaluation for acute dizziness, lethargy. EXAM: MRI HEAD WITHOUT CONTRAST TECHNIQUE: Multiplanar, multiecho pulse sequences of the brain and surrounding structures were obtained without intravenous contrast. COMPARISON:  Prior head CT and CTA from 06/15/2020 FINDINGS: Brain: Examination mildly degraded by motion artifact. Cerebral volume within normal limits. Mild chronic microvascular ischemic disease noted involving the periventricular white matter. No abnormal foci of restricted diffusion to suggest acute or subacute ischemia. Gray-white matter differentiation maintained. No encephalomalacia to suggest chronic cortical infarction. No evidence for acute or chronic intracranial hemorrhage. 1.4 x 1.1 x 1.2 cm well-circumscribed cystic lesion seen within the pituitary gland, indeterminate, but 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 Suprasellar extension without significant mass effect on the optic chiasm. No other mass lesion, midline shift or mass effect. No hydrocephalus or extra-axial fluid collection. Vascular: Major intracranial vascular flow voids are well maintained. Skull and upper cervical spine: Craniocervical junction within normal limits. Bone marrow signal intensity normal. No scalp soft tissue abnormality. Sinuses/Orbits: Patient status post bilateral ocular lens replacement. Globes and orbital soft tissues within normal limits. Fluid seen within the nasopharynx. Patient is intubated. Trace bilateral mastoid effusions noted. Other: None. IMPRESSION: 1. No acute intracranial abnormality. 2. Mild chronic microvascular ischemic disease for age. 3. 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 Electronically Signed   By: Jeannine Boga M.D.   On: 06/16/2020 05:01   CT ABDOMEN PELVIS W CONTRAST  Result Date: 06/16/2020 CLINICAL DATA:  75 year old male  with unexplained weight loss and persistent hyponatremia. EXAM: CT CHEST, ABDOMEN, AND PELVIS WITH CONTRAST TECHNIQUE: Multidetector CT imaging of the chest, abdomen and pelvis was performed following the standard protocol during bolus administration of intravenous contrast. CONTRAST:  92mL OMNIPAQUE IOHEXOL 300 MG/ML  SOLN COMPARISON:  06/15/2020 chest radiograph FINDINGS: CT CHEST FINDINGS Cardiovascular: UPPER limits normal heart size noted. Coronary artery atherosclerotic calcification involving the LEFT main and LAD noted. Mild thoracic aortic atherosclerotic calcifications noted without aneurysm. No pericardial effusion. Mediastinum/Nodes: No enlarged mediastinal, hilar, or axillary lymph nodes. Thyroid gland, trachea, and esophagus demonstrate no significant findings. Lungs/Pleura: Mild to moderate atelectasis in both lung bases and in the SUPERIOR segment LEFT LOWER lobe noted. There is no evidence of airspace disease, consolidation, mass, suspicious nodule, pleural effusion or pneumothorax. Musculoskeletal: No acute or suspicious bony abnormalities are noted. CT ABDOMEN PELVIS FINDINGS Hepatobiliary: The liver and gallbladder are unremarkable. No biliary dilatation. Pancreas: Unremarkable Spleen: Unremarkable Adrenals/Urinary Tract: The kidneys, adrenal glands and bladder are unremarkable. Stomach/Bowel: Stomach is within normal limits. Appendix appears normal. No evidence of bowel wall thickening, distention, or inflammatory changes. Vascular/Lymphatic: Aortic atherosclerosis. No enlarged abdominal or pelvic lymph nodes. Reproductive:  Prostatectomy. Other: No ascites, focal collection or pneumoperitoneum. Musculoskeletal: No acute or suspicious bony abnormalities. 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 IMPRESSION: 1. No evidence of primary malignancy or metastatic disease. 2. Mild to moderate bibasilar and SUPERIOR segment LEFT LOWER lobe  atelectasis. 3. Coronary artery disease. 4. Aortic Atherosclerosis (ICD10-I70.0). Electronically Signed   By: Margarette Canada M.D.   On: 06/16/2020 19:21   ECHOCARDIOGRAM COMPLETE  Result Date: 06/16/2020    ECHOCARDIOGRAM REPORT   Patient Name:   TUFF MATURO Date of Exam: 06/16/2020 Medical Rec #:  [id number]   Height:       70.0 in Accession #:    [id number]  Weight:       181.7 lb Date of Birth:  [birth year]    BSA:          2.004 m Patient Age:    45 years    BP:           148/70 mmHg Patient Gender: M           HR:           75 bpm. Exam Location:  Inpatient Procedure: 2D Echo, Cardiac Doppler and Color Doppler Indications:    CVA  History:        Patient has prior history of Echocardiogram examinations, most                 recent 04/28/2020. Stroke, Arrythmias:Atrial Fibrillation;                 Signs/Symptoms:Altered Mental Status.  Sonographer:    Dustin Flock Referring Phys: [id number] Karsten Fells [facility]  Sonographer Comments: Suboptimal parasternal window, suboptimal apical window and echo performed with patient supine and on artificial respirator. Image acquisition challenging due to uncooperative patient. IMPRESSIONS  1. Left ventricular ejection fraction, by estimation, is 55 to 60%. The left ventricle has normal function. The left ventricle has no regional wall motion abnormalities. There is mild concentric left ventricular hypertrophy. Left ventricular diastolic parameters are consistent with Grade I diastolic dysfunction (impaired relaxation).  2. Right ventricular systolic function is normal. The right ventricular size is normal. There is normal pulmonary artery systolic pressure. The estimated right ventricular systolic pressure is [id number] mmHg.  3. The mitral valve is normal in structure. No evidence of mitral valve regurgitation. No evidence of mitral stenosis. Moderate mitral annular calcification.  4. The aortic valve is normal in structure. Aortic valve regurgitation is not visualized. Mild to  moderate aortic valve sclerosis/calcification is present, without any evidence of aortic stenosis.  5. The inferior vena cava is normal in size with greater than 50% respiratory variability, suggesting right atrial pressure of 3 mmHg. Conclusion(s)/Recommendation(s): No intracardiac source of embolism detected on this transthoracic study. A transesophageal echocardiogram is recommended to exclude cardiac source of embolism if clinically indicated. FINDINGS  Left Ventricle: Left ventricular ejection fraction, by estimation, is 55 to 60%. The left ventricle has normal function. The left ventricle has no regional wall motion abnormalities. The left ventricular internal cavity size was normal in size. There is  mild concentric left ventricular hypertrophy. Left ventricular diastolic parameters are consistent with Grade I diastolic dysfunction (impaired relaxation). Normal left ventricular filling pressure. Right Ventricle: The right ventricular size is normal. No increase in right ventricular wall thickness. Right ventricular systolic function is normal. There is normal pulmonary artery systolic pressure. The tricuspid regurgitant velocity is 2.46 m/s, and  with an assumed right atrial pressure of 3  mmHg, the estimated right ventricular systolic pressure is [id number] mmHg. Left Atrium: Left atrial size was normal in size. Right Atrium: Right atrial size was normal in size. Pericardium: There is no evidence of pericardial effusion. Mitral Valve: The mitral valve is normal in structure. Moderate mitral annular calcification. No evidence of mitral valve regurgitation. No evidence of mitral valve stenosis. Tricuspid Valve: The tricuspid valve is normal in structure. Tricuspid valve regurgitation is mild . No evidence of tricuspid stenosis. Aortic Valve: The aortic valve is normal in structure. Aortic valve regurgitation is not visualized. Mild to moderate aortic valve sclerosis/calcification is present, without any evidence of  aortic stenosis. Pulmonic Valve: The pulmonic valve was normal in structure. Pulmonic valve regurgitation is not visualized. No evidence of pulmonic stenosis. Aorta: The aortic root is normal in size and structure. Venous: The inferior vena cava is normal in size with greater than 50% respiratory variability, suggesting right atrial pressure of 3 mmHg. IAS/Shunts: No atrial level shunt detected by color flow Doppler.  LEFT VENTRICLE PLAX 2D LVIDd:         4.20 cm  Diastology LVIDs:         3.00 cm  LV e' medial:    5.77 cm/s LV PW:         1.10 cm  LV E/e' medial:  10.2 LV IVS:        1.10 cm  LV e' lateral:   5.55 cm/s LVOT diam:     1.90 cm  LV E/e' lateral: 10.6 LV SV:         41 LV SV Index:   20 LVOT Area:     2.84 cm  RIGHT VENTRICLE RV Basal diam:  3.20 cm RV S prime:     13.30 cm/s TAPSE (M-mode): 2.4 cm LEFT ATRIUM           Index       RIGHT ATRIUM           Index LA diam:      2.90 cm 1.45 cm/m  RA Area:     15.00 cm LA Vol (A4C): 31.9 ml 15.92 ml/m RA Volume:   33.40 ml  16.67 ml/m  AORTIC VALVE LVOT Vmax:   63.30 cm/s LVOT Vmean:  43.200 cm/s LVOT VTI:    0.143 m  AORTA Ao Root diam: 3.20 cm MITRAL VALVE               TRICUSPID VALVE MV Area (PHT): 5.27 cm    TR Peak grad:   24.2 mmHg MV Decel Time: 144 msec    TR Vmax:        246.00 cm/s MV E velocity: 58.70 cm/s MV A velocity: 64.30 cm/s  SHUNTS MV E/A ratio:  0.91        Systemic VTI:  0.14 m                            Systemic Diam: 1.90 cm Ena Dawley MD Electronically signed by Ena Dawley MD Signature Date/Time: 06/16/2020/11:41:05 AM    Final    CT HEAD CODE STROKE WO CONTRAST  Result Date: 06/15/2020 CLINICAL DATA:  Code stroke. EXAM: CT HEAD WITHOUT CONTRAST TECHNIQUE: Contiguous axial images were obtained from the base of the skull through the vertex without intravenous contrast. COMPARISON:  None. FINDINGS: Brain: There is no acute intracranial hemorrhage, mass effect, or edema. Gray-white differentiation is preserved. Minimal  patchy hypoattenuation in the supratentorial  white matter is nonspecific but may reflect minor chronic microvascular ischemic changes. Ventricles and sulci are within normal limits in size and configuration. No extra-axial collection. Vascular: No hyperdense vessel. Skull: Unremarkable. Sinuses/Orbits: Mild mucosal thickening.  Orbits are unremarkable. Other: Expanded, empty sella. ASPECTS ([place] Stroke Program Early CT Score) - Ganglionic level infarction (caudate, lentiform nuclei, internal capsule, insula, M1-M3 cortex): 7 - Supraganglionic infarction (M4-M6 cortex): 3 Total score (0-10 with 10 being normal): 10 IMPRESSION: There is no acute intracranial hemorrhage or evidence of acute infarction. ASPECT score is 10. These results were communicated to Dr. Leonie Man at 1:11 pm on 06/15/2020 by text page via the [facility] messaging system. Electronically Signed   By: Macy Mis M.D.   On: 06/15/2020 13:12   CT ANGIO HEAD CODE STROKE  Result Date: 06/15/2020 CLINICAL DATA:  Code stroke follow-up EXAM: CT ANGIOGRAPHY HEAD AND NECK TECHNIQUE: Multidetector CT imaging of the head and neck was performed using the standard protocol during bolus administration of intravenous contrast. Multiplanar CT image reconstructions and MIPs were obtained to evaluate the vascular anatomy. Carotid stenosis measurements (when applicable) are obtained utilizing NASCET criteria, using the distal internal carotid diameter as the denominator. CONTRAST:  67mL OMNIPAQUE IOHEXOL 350 MG/ML SOLN COMPARISON:  None. FINDINGS: Poor contrast bolus timing limiting arterial evaluation. CTA NECK Aortic arch: Great vessel origins are patent. Right carotid system: Patent. Mild mixed plaque at the common carotid bifurcation. No measurable stenosis. Left carotid system: Patent. Mild mixed plaque at the common carotid bifurcation and proximal ICA. No measurable stenosis. Vertebral arteries: Patent. Skeleton: Degenerative changes of the cervical spine. Other  neck: Soft tissue thickening at the base of tongue within the valleculae probably reflects tonsillar tissue. No adenopathy. Upper chest: No apical lung mass. Review of the MIP images confirms the above findings CTA HEAD Anterior circulation: Intracranial internal carotid arteries are patent. Bilateral M1 MCA segments are patent. Proximal anterior cerebral artery is patent. Posterior circulation: Intracranial vertebral arteries, basilar artery, and posterior cerebral arteries are patent. Venous sinuses: Patent as allowed by contrast bolus timing. Possible hypoenhancing lesion mildly expanding the sella. Review of the MIP images confirms the above findings IMPRESSION: Suboptimal evaluation due to contrast bolus timing. No large vessel occlusion or hemodynamically significant stenosis in the neck. No proximal intracranial vessel occlusion. Possible hypoenhancing lesion mildly expanding the sella. This would be better evaluated on MRI. Initial results were communicated to Dr. Leonie Man at 1:20 pm on 06/15/2020 by text page via the [facility] messaging system. Electronically Signed   By: Macy Mis M.D.   On: 06/15/2020 13:35   CT ANGIO NECK CODE STROKE  Result Date: 06/15/2020 CLINICAL DATA:  Code stroke follow-up EXAM: CT ANGIOGRAPHY HEAD AND NECK TECHNIQUE: Multidetector CT imaging of the head and neck was performed using the standard protocol during bolus administration of intravenous contrast. Multiplanar CT image reconstructions and MIPs were obtained to evaluate the vascular anatomy. Carotid stenosis measurements (when applicable) are obtained utilizing NASCET criteria, using the distal internal carotid diameter as the denominator. CONTRAST:  26mL OMNIPAQUE IOHEXOL 350 MG/ML SOLN COMPARISON:  None. FINDINGS: Poor contrast bolus timing limiting arterial evaluation. CTA NECK Aortic arch: Great vessel origins are patent. Right carotid system: Patent. Mild mixed plaque at the common carotid bifurcation. No measurable  stenosis. Left carotid system: Patent. Mild mixed plaque at the common carotid bifurcation and proximal ICA. No measurable stenosis. Vertebral arteries: Patent. Skeleton: Degenerative changes of the cervical spine. Other neck: Soft tissue thickening at the base of tongue  within the valleculae probably reflects tonsillar tissue. No adenopathy. Upper chest: No apical lung mass. Review of the MIP images confirms the above findings CTA HEAD Anterior circulation: Intracranial internal carotid arteries are patent. Bilateral M1 MCA segments are patent. Proximal anterior cerebral artery is patent. Posterior circulation: Intracranial vertebral arteries, basilar artery, and posterior cerebral arteries are patent. Venous sinuses: Patent as allowed by contrast bolus timing. Possible hypoenhancing lesion mildly expanding the sella. Review of the MIP images confirms the above findings IMPRESSION: Suboptimal evaluation due to contrast bolus timing. No large vessel occlusion or hemodynamically significant stenosis in the neck. No proximal intracranial vessel occlusion. Possible hypoenhancing lesion mildly expanding the sella. This would be better evaluated on MRI. Initial results were communicated to Dr. Leonie Man at 1:20 pm on 06/15/2020 by text page via the [facility] messaging system. Electronically Signed   By: Macy Mis M.D.   On: 06/15/2020 13:35   IR ANGIO INTRA EXTRACRAN SEL COM CAROTID INNOMINATE BILAT MOD SED  Result Date: 06/16/2020 CLINICAL DATA:  Left gaze deviation with aphasia. Suspect left middle cerebral artery occlusion. EXAM: BILATERAL COMMON CAROTID AND INNOMINATE ANGIOGRAPHY COMPARISON:  CT angiogram of the head and neck of June 15, 2020. MEDICATIONS: Heparin none units IV. Ancef 2 g IV antibiotic was administered within 1 hour of the procedure. ANESTHESIA/SEDATION: General anesthesia. CONTRAST:  Isovue 300 approximately 65 mL FLUOROSCOPY TIME:  Fluoroscopy Time: 7 minutes 48 seconds (1139 mGy). COMPLICATIONS:  None immediate. TECHNIQUE: Informed written consent was obtained from the patient after a thorough discussion of the procedural risks, benefits and alternatives. All questions were addressed. Maximal Sterile Barrier Technique was utilized including caps, mask, sterile gowns, sterile gloves, sterile drape, hand hygiene and skin antiseptic. A timeout was performed prior to the initiation of the procedure. The right groin was prepped and draped in the usual sterile fashion. Thereafter using modified Seldinger technique, transfemoral access into the right common femoral artery was obtained without difficulty. Over a 0.035 inch guidewire, an 8 French 25 cm Pinnacle sheath was inserted. Through this, and also over 0.035 inch guidewire, a 5 [place] JB 1 catheter was advanced to the aortic arch region and selectively positioned in the right common carotid artery, the right vertebral artery, the left common carotid artery and the left vertebral artery. FINDINGS: The left common carotid arteriogram demonstrates the left external carotid artery and its major branches to be widely patent. The left internal carotid artery at the bulb to the cranial skull base is widely patent. The petrous, cavernous and supraclinoid segments demonstrate patency. The left posterior communicating artery is seen partially opacifying the left posterior cerebral artery distribution. The left middle cerebral artery and the left anterior cerebral artery opacify into the capillary and venous phases. Magnified oblique images obtained demonstrated no evidence of intraluminal filling defects, occlusions, or of dissections. The right common carotid arteriogram demonstrates the right external carotid artery and its major branches to be widely patent. The right internal carotid artery at the bulb to the cranial skull base is widely patent. The petrous, the cavernous and the supraclinoid segments are patent. The right middle cerebral artery and the right  anterior cerebral artery opacify into the capillary and venous phases. Cross-filling via the anterior communicating artery of the left anterior cerebral artery to the A2 segment and distally is seen. The right vertebral artery origin is widely patent. The vessel opacifies to the cranial skull base. More distally the right vertebrobasilar junction and the right posterior-inferior cerebellar artery demonstrate wide patency. The  basilar artery, the posterior cerebral arteries, the superior cerebellar arteries and the anterior-inferior cerebellar arteries opacify into the capillary and venous phases. The left vertebral artery origin is widely patent. The vessel demonstrates mild to moderate tortuosity just distal to the origin. More distally the vessel opacifies to the cranial skull base. Patency is maintained of the left vertebrobasilar junction with a hypoplastic left posterior-inferior cerebral artery. The opacified portion of the basilar artery, the posterior cerebral arteries, the superior cerebellar arteries and the anterior-inferior cerebellar arteries are grossly patent. Unopacified blood is seen in the basilar artery from contralateral right vertebral artery. 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 IMPRESSION: Angiographically no evidence of occlusions, intraluminal filling defects, vasospasm, abnormal arteriovenous shunting or of intracranial aneurysms. Venous outflow within normal limits. PLAN: As per referring MD. Electronically Signed   By: Luanne Bras M.D.   On: 06/15/2020 14:59   IR ANGIO VERTEBRAL SEL VERTEBRAL BILAT MOD SED  Result Date: 06/16/2020 CLINICAL DATA:  Left gaze deviation with aphasia. Suspect left middle cerebral artery occlusion. EXAM: BILATERAL COMMON CAROTID AND INNOMINATE ANGIOGRAPHY COMPARISON:  CT angiogram of the head and neck of June 15, 2020. MEDICATIONS: Heparin none units IV. Ancef 2 g IV antibiotic was  administered within 1 hour of the procedure. ANESTHESIA/SEDATION: General anesthesia. CONTRAST:  Isovue 300 approximately 65 mL FLUOROSCOPY TIME:  Fluoroscopy Time: 7 minutes 48 seconds (1139 mGy). COMPLICATIONS: None immediate. TECHNIQUE: Informed written consent was obtained from the patient after a thorough discussion of the procedural risks, benefits and alternatives. All questions were addressed. Maximal Sterile Barrier Technique was utilized including caps, mask, sterile gowns, sterile gloves, sterile drape, hand hygiene and skin antiseptic. A timeout was performed prior to the initiation of the procedure. The right groin was prepped and draped in the usual sterile fashion. Thereafter using modified Seldinger technique, transfemoral access into the right common femoral artery was obtained without difficulty. Over a 0.035 inch guidewire, an 8 French 25 cm Pinnacle sheath was inserted. Through this, and also over 0.035 inch guidewire, a 5 [place] JB 1 catheter was advanced to the aortic arch region and selectively positioned in the right common carotid artery, the right vertebral artery, the left common carotid artery and the left vertebral artery. FINDINGS: The left common carotid arteriogram demonstrates the left external carotid artery and its major branches to be widely patent. The left internal carotid artery at the bulb to the cranial skull base is widely patent. The petrous, cavernous and supraclinoid segments demonstrate patency. The left posterior communicating artery is seen partially opacifying the left posterior cerebral artery distribution. The left middle cerebral artery and the left anterior cerebral artery opacify into the capillary and venous phases. Magnified oblique images obtained demonstrated no evidence of intraluminal filling defects, occlusions, or of dissections. The right common carotid arteriogram demonstrates the right external carotid artery and its major branches to be widely patent.  The right internal carotid artery at the bulb to the cranial skull base is widely patent. The petrous, the cavernous and the supraclinoid segments are patent. The right middle cerebral artery and the right anterior cerebral artery opacify into the capillary and venous phases. Cross-filling via the anterior communicating artery of the left anterior cerebral artery to the A2 segment and distally is seen. The right vertebral artery origin is widely patent. The vessel opacifies to the cranial skull base. More distally the right vertebrobasilar junction and the right posterior-inferior cerebellar artery demonstrate wide patency. The basilar artery, the  posterior cerebral arteries, the superior cerebellar arteries and the anterior-inferior cerebellar arteries opacify into the capillary and venous phases. The left vertebral artery origin is widely patent. The vessel demonstrates mild to moderate tortuosity just distal to the origin. More distally the vessel opacifies to the cranial skull base. Patency is maintained of the left vertebrobasilar junction with a hypoplastic left posterior-inferior cerebral artery. The opacified portion of the basilar artery, the posterior cerebral arteries, the superior cerebellar arteries and the anterior-inferior cerebellar arteries are grossly patent. Unopacified blood is seen in the basilar artery from contralateral right vertebral artery. 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 IMPRESSION: Angiographically no evidence of occlusions, intraluminal filling defects, vasospasm, abnormal arteriovenous shunting or of intracranial aneurysms. Venous outflow within normal limits. PLAN: As per referring MD. Electronically Signed   By: Luanne Bras M.D.   On: 06/15/2020 14:59     Labs:   Basic Metabolic Panel: Recent Labs  Lab 06/15/20 1635 06/15/20 2346 06/17/20 2129 06/18/20 0440 06/18/20 0749 06/18/20 2017 06/19/20 0940  06/20/20 0507  NA  --    < > 124*  --  125* 124* 123* 124*  K  --    < > 3.8  --  4.0 3.6 3.7 4.3  CL  --    < > 93*  --  91* 91* 91* 88*  CO2  --    < > 22  --  22 23 22 25   GLUCOSE  --    < > 108*  --  98 105* 126* 105*  BUN  --    < > 12  --  14 18 15 19   CREATININE  --    < > 0.88  --  0.91 0.84 0.82 0.85  CALCIUM  --    < > 8.7*  --  8.7* 8.6* 8.8* 8.8*  MG 2.1  --   --  2.0  --   --   --   --   PHOS 2.7  --   --  3.5  --   --   --   --    < > = values in this interval not displayed.   GFR Estimated Creatinine Clearance: 78.7 mL/min (by C-G formula based on SCr of 0.85 mg/dL). Liver Function Tests: Recent Labs  Lab 06/15/20 1230  AST 24  ALT 11  ALKPHOS 49  BILITOT 0.7  PROT 6.3*  ALBUMIN 3.8   No results for input(s): LIPASE, AMYLASE in the last 168 hours. No results for input(s): AMMONIA in the last 168 hours. Coagulation profile Recent Labs  Lab 06/15/20 1635  INR 1.4*    CBC: Recent Labs  Lab 06/15/20 1230 06/15/20 1241 06/18/20 0440  WBC 9.0  --  7.7  NEUTROABS 4.9  --   --   HGB 14.5 14.6 14.0  HCT 41.4 43.0 38.1*  MCV 86.4  --  83.6  PLT 228  --  205   Cardiac Enzymes: No results for input(s): CKTOTAL, CKMB, CKMBINDEX, TROPONINI in the last 168 hours. BNP: Invalid input(s): POCBNP CBG: Recent Labs  Lab 06/19/20 2040 06/19/20 2329 06/20/20 0437 06/20/20 0751 06/20/20 1209  GLUCAP 123* 121* 102* 99 89   D-Dimer No results for input(s): DDIMER in the last 72 hours. Hgb A1c No results for input(s): HGBA1C in the last 72 hours. Lipid Profile No results for input(s): CHOL, HDL, LDLCALC, TRIG, CHOLHDL, LDLDIRECT in the last 72 hours. Thyroid function studies No results for input(s): TSH,  T4TOTAL, T3FREE, THYROIDAB in the last 72 hours.  Invalid input(s): FREET3 Anemia work up No results for input(s): VITAMINB12, FOLATE, FERRITIN, TIBC, IRON, RETICCTPCT in the last 72 hours. Microbiology Recent Results (from the past 240 hour(s))  Resp  Panel by RT-PCR (Flu A&B, Covid) Nasopharyngeal Swab     Status: None   Collection Time: 06/15/20  1:02 PM   Specimen: Nasopharyngeal Swab; Nasopharyngeal(NP) swabs in vial transport medium  Result Value Ref Range Status   SARS Coronavirus 2 by RT PCR NEGATIVE NEGATIVE Final    Comment: (NOTE) SARS-CoV-2 target nucleic acids are NOT DETECTED.  The SARS-CoV-2 RNA is generally detectable in upper respiratory specimens during the acute phase of infection. The lowest concentration of SARS-CoV-2 viral copies this assay can detect is 138 copies/mL. A negative result does not preclude SARS-Cov-2 infection and should not be used as the sole basis for treatment or other patient management decisions. A negative result may occur with  improper specimen collection/handling, submission of specimen other than nasopharyngeal swab, presence of viral mutation(s) within the areas targeted by this assay, and inadequate number of viral copies(<138 copies/mL). A negative result must be combined with clinical observations, patient history, and epidemiological information. The expected result is Negative.  Fact Sheet for Patients:  EntrepreneurPulse.com.au  Fact Sheet for Healthcare Providers:  IncredibleEmployment.be  This test is no t yet approved or cleared by the [place] FDA and  has been authorized for detection and/or diagnosis of SARS-CoV-2 by FDA under an Emergency Use Authorization (EUA). This EUA will remain  in effect (meaning this test can be used) for the duration of the COVID-19 declaration under Section 564(b)(1) of the Act, 21 U.S.C.section 360bbb-3(b)(1), unless the authorization is terminated  or revoked sooner.       Influenza A by PCR NEGATIVE NEGATIVE Final   Influenza B by PCR NEGATIVE NEGATIVE Final    Comment: (NOTE) The Xpert Xpress SARS-CoV-2/FLU/RSV plus assay is intended as an aid in the diagnosis of influenza from Nasopharyngeal  swab specimens and should not be used as a sole basis for treatment. Nasal washings and aspirates are unacceptable for Xpert Xpress SARS-CoV-2/FLU/RSV testing.  Fact Sheet for Patients: EntrepreneurPulse.com.au  Fact Sheet for Healthcare Providers: IncredibleEmployment.be  This test is not yet approved or cleared by the [place] FDA and has been authorized for detection and/or diagnosis of SARS-CoV-2 by FDA under an Emergency Use Authorization (EUA). This EUA will remain in effect (meaning this test can be used) for the duration of the COVID-19 declaration under Section 564(b)(1) of the Act, 21 U.S.C. section 360bbb-3(b)(1), unless the authorization is terminated or revoked.  Performed at [facility], [place] [address], [place], [place] [address]   MRSA PCR Screening     Status: None   Collection Time: 06/15/20  6:34 PM   Specimen: Nasal Mucosa; Nasopharyngeal  Result Value Ref Range Status   MRSA by PCR NEGATIVE NEGATIVE Final    Comment:        The GeneXpert MRSA Assay (FDA approved for NASAL specimens only), is one component of a comprehensive MRSA colonization surveillance program. It is not intended to diagnose MRSA infection nor to guide or monitor treatment for MRSA infections. Performed at [facility], [place] [address] [place], [place] [address]   Culture, blood (routine x 2)     Status: None   Collection Time: 06/15/20  6:58 PM   Specimen: BLOOD RIGHT HAND  Result Value Ref Range Status   Specimen  Description BLOOD RIGHT HAND  Final   Special Requests   Final    BOTTLES DRAWN AEROBIC AND ANAEROBIC Blood Culture results may not be optimal due to an inadequate volume of blood received in culture bottles   Culture   Final    NO GROWTH 5 DAYS Performed at [facility], [place] [address], [place], [place] [address]    Report Status 06/20/2020 FINAL  Final  Culture, blood (routine x 2)     Status: None    Collection Time: 06/15/20  7:09 PM   Specimen: BLOOD LEFT HAND  Result Value Ref Range Status   Specimen Description BLOOD LEFT HAND  Final   Special Requests   Final    BOTTLES DRAWN AEROBIC ONLY Blood Culture results may not be optimal due to an inadequate volume of blood received in culture bottles   Culture   Final    NO GROWTH 5 DAYS Performed at [facility], [place] [address] [place], [place] [address]    Report Status 06/20/2020 FINAL  Final     Signed: Marlowe Aschoff Hind Chesler  [facility] 06/20/2020, 4:04 PM

## 2020-06-20 NOTE — Progress Notes (Signed)
Physical Therapy Treatment Patient Details Name: Jermaine Soto MRN: [id number] DOB: [birth year] Today's Date: 06/20/2020    History of Present Illness The pt is a 75 yo male presenting initially unresponsive, noted to be aphasic with L-sided gaze preference. Imaging revealed no acute abnormalities. Extubated 1/6. PMH includes: Afib on Eliquis, hypothyroidism, prostate cancer, and hyponatremia.    PT Comments    Pt fully participated in session. Pt with decreased awareness of situation and deficits. Pt requiring increased assist for sit<>stand and to avoid obstacles during ambulation. Pt easily fatigued during session requiring increased assist with transfers. Pt continues to demonstrate deficits in balance, strength, coordination, gait, safety and endurance and will benefit from skilled PT to address deficits to maximize independence with functional mobility prior to discharge. Pt can tolerate 3 hours of therapy and continue to recommend CIR    Follow Up Recommendations  CIR     Equipment Recommendations  Rolling walker with 5" wheels;3in1 (PT)    Recommendations for Other Services       Precautions / Restrictions Precautions Precautions: Fall Precaution Comments: poor safety awareness Restrictions Weight Bearing Restrictions: No    Mobility  Bed Mobility Overal bed mobility: Needs Assistance Bed Mobility: Supine to Sit     Supine to sit: Min assist;HOB elevated        Transfers Overall transfer level: Needs assistance Equipment used: Rolling walker (2 wheeled) Transfers: Sit to/from Stand   Stand pivot transfers: Mod assist       General transfer comment: modA to power up with increased assist to steady and for cues for technique/sequencing. pt often needing repeated short, simple cues with tactile cues prior to following commands. performed x 5 trials with increased posterior lean and use of bed for stability due to fatigue  Ambulation/Gait Ambulation/Gait  assistance: Min assist Gait Distance (Feet): 100 Feet Assistive device: Rolling walker (2 wheeled) Gait Pattern/deviations: Step-through pattern;Decreased step length - right;Decreased step length - left;Narrow base of support;Trunk flexed     General Gait Details: poor clearance bilaterally, heavy reliance on RW with trunk flexion. increased cueing needed to avoid obstacles   Stairs             Wheelchair Mobility    Modified Rankin (Stroke Patients Only) Modified Rankin (Stroke Patients Only) Pre-Morbid Rankin Score: No symptoms Modified Rankin: Moderately severe disability     Balance Overall balance assessment: Needs assistance Sitting-balance support: No upper extremity supported;Feet supported Sitting balance-Leahy Scale: Fair     Standing balance support: No upper extremity supported Standing balance-Leahy Scale: Poor                              Cognition Arousal/Alertness: Awake/alert Behavior During Therapy: WFL for tasks assessed/performed;Impulsive Overall Cognitive Status: Impaired/Different from baseline Area of Impairment: Following commands;Safety/judgement;Awareness;Problem solving;Memory                 Orientation Level: Disoriented to;Situation   Memory: Decreased short-term memory Following Commands: Follows one step commands with increased time;Follows multi-step commands inconsistently Safety/Judgement: Decreased awareness of safety;Decreased awareness of deficits Awareness: Intellectual Problem Solving: Slow processing;Requires verbal cues;Difficulty sequencing General Comments: poor safety awareness and poor understanding of deficits      Exercises      General Comments        Pertinent Vitals/Pain Pain Assessment: No/denies pain    Home Living  Prior Function            PT Goals (current goals can now be found in the care plan section) Acute Rehab PT Goals Patient Stated  Goal: I want to go home to take care of my chores PT Goal Formulation: With patient Time For Goal Achievement: 07/01/20 Potential to Achieve Goals: Good Progress towards PT goals: Progressing toward goals    Frequency    Min 4X/week      PT Plan Current plan remains appropriate    Co-evaluation              AM-PAC PT "6 Clicks" Mobility   Outcome Measure  Help needed turning from your back to your side while in a flat bed without using bedrails?: A Little Help needed moving from lying on your back to sitting on the side of a flat bed without using bedrails?: A Little Help needed moving to and from a bed to a chair (including a wheelchair)?: A Little Help needed standing up from a chair using your arms (e.g., wheelchair or bedside chair)?: A Lot Help needed to walk in hospital room?: A Lot Help needed climbing 3-5 steps with a railing? : A Lot 6 Click Score: 15    End of Session Equipment Utilized During Treatment: Gait belt Activity Tolerance: Patient tolerated treatment well Patient left: in chair;with call bell/phone within reach;with chair alarm set Nurse Communication: Mobility status PT Visit Diagnosis: Unsteadiness on feet (R26.81);Other abnormalities of gait and mobility (R26.89)     Time: 4132-4401 PT Time Calculation (min) (ACUTE ONLY): 21 min  Charges:  $Gait Training: 8-22 mins                     Lyanne Co, DPT Acute Rehabilitation Services [phone]   [place] 06/20/2020, 1:51 PM

## 2020-06-20 NOTE — Progress Notes (Addendum)
PROGRESS NOTE  Jermaine Soto  DOB: March 18, 1946  PCP: Marrian Salvage, [place]  DOA: 06/15/2020  LOS: 5 days   Chief Complaint  Patient presents with  . Code Stroke   Brief narrative: Jermaine Soto is a 75 y.o. male with PMH significant for atrial fibrillation on apixaban diagnosed October 2021. Patient presented to the ED on 1/5 as a code stroke. On evaluation in the ED, he was hypertensive, had left gaze preference, severe right-sided neglect and global aphasia. CT, CTA and angiogram were unrevealing.   Not given TPA as patient was already on anticoagulation.   Sodium level was low at 121. He was admitted to ICU with initial impression of acute stroke.  However MRI brain was negative as well.  Symptoms gradually improved. Given history of unexplained weight loss, persistent hyponatremia and reported cognitive changes, work-up to exclude occult malignancy was undertaken.  Imaging was unremarkable.   Subjective: Patient was seen and examined this morning.   Sitting up in bed.  Not in distress.  Frustrated to be waiting for CIR bed availability. Sodium level slightly up today.  Assessment/Plan: Acute encephalopathy -Presenting symptoms as above.  Etiology unclear but thought secondary to hyponatremia. -Stroke work-up negative.  EEG nonspecific but no seizures seen.  Repeat EEG moderate diffuse encephalopathy, nonspecific.   -MRI brain unremarkable as was CT angiogram and angiogram. -Symptoms resolved at this point.   Acute on chronic hyponatremia --120s in December 2021.   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   -1/9, I mean he is fluid restriction more strict from 2 L a day to 1500 mill a day.  Sodium slightly up at 224 today.  Continue to monitor. Recent Labs  Lab 06/16/20 0320 06/16/20 0746  06/16/20 1634 06/16/20 2346 06/17/20 0902 06/17/20 2129 06/18/20 0749 06/18/20 2017 06/19/20 0940 06/20/20 0507  NA 122* 121* 123* 124* 124* 124* 125* 124* 123* 124*   Atrial fibrillation on apixaban as an outpatient --echo unremarkable --EKG nonacute, showed SR on admit  Hypothyroidism, TSH 0.877 --Continue levothyroxine  Unintentional weight loss as an outpatient last 6 months, moderate malnutrition --Ensure, Magic cup --Work-up here unremarkable.  Liberalize diet.  Follow-up as an outpatient.  Prolonged QT -Improved.  1/9, EKG with QTC at 455 ms.  Mobility: Encourage ambulation. Code Status:   Code Status: Full Code  Nutritional status: Body mass index is 26.07 kg/m. Nutrition Problem: Moderate Malnutrition Etiology: chronic illness (hypothyroidism) Signs/Symptoms: moderate muscle depletion,moderate fat depletion Diet Order            Diet regular Room service appropriate? Yes; Fluid consistency: Thin; Fluid restriction: 1500 mL Fluid  Diet effective now                 DVT prophylaxis: SCD's Start: 06/15/20 1325 apixaban (ELIQUIS) tablet 5 mg   Antimicrobials:  None Fluid: None Consultants: None Family Communication:  Discussed with his wife on the phone.  I asked her if she was thinking of taking him home with physical therapy.  She states he is herself sick and will not be able to support him at home.  Status is: Inpatient Remains inpatient appropriate because: Pending CIR  Dispo: The patient is from: Home              Anticipated d/c is to: CIR pending  Anticipated d/c date is: Stable to discharge when bed is available at CIR.  Infusions:   Scheduled Meds: . apixaban  5 mg Oral BID  . chlorhexidine gluconate (MEDLINE KIT)  15 mL Mouth Rinse BID  . Chlorhexidine Gluconate Cloth  6 each Topical Daily  . docusate sodium  100 mg Oral BID  . feeding supplement  237 mL Oral TID BM  . levothyroxine  37.5 mcg Oral QAC breakfast  .  polyethylene glycol  17 g Oral Daily    Antimicrobials: Anti-infectives (From admission, onward)   Start     Dose/Rate Route Frequency Ordered Stop   06/15/20 1300  ceFAZolin (ANCEF) 2-4 GM/100ML-% IVPB       Note to Pharmacy: Fredric Dine   : cabinet override      06/15/20 1300 06/16/20 0114      PRN meds:    Objective: Vitals:   06/20/20 0357 06/20/20 0810  BP: 132/71 115/73  Pulse: 71 64  Resp: 18   Temp: 98.1 F (36.7 C) 98.2 F (36.8 C)  SpO2: 98% 97%    Intake/Output Summary (Last 24 hours) at 06/20/2020 1001 Last data filed at 06/20/2020 0900 Gross per 24 hour  Intake 360 ml  Output 1 ml  Net 359 ml   Filed Weights   06/15/20 1328 06/15/20 1830  Weight: 81.5 kg 82.4 kg   Weight change:  Body mass index is 26.07 kg/m.   Physical Exam: General exam: Pleasant, elderly Caucasian male.  Not in distress Skin: No rashes, lesions or ulcers. HEENT: Atraumatic, normocephalic, no obvious bleeding Lungs: Clear to auscultation bilaterally CVS: Regular rate and rhythm, no murmur GI/Abd soft, nontender, nondistended, bowel sounds present CNS: Alert, awake, oriented x3.  Slow to respond but consistent and oriented. Psychiatry: Mood appropriate Extremities: No pedal edema, no calf tenderness  Data Review: I have personally reviewed the laboratory data and studies available.  Recent Labs  Lab 06/15/20 1230 06/15/20 1241 06/18/20 0440  WBC 9.0  --  7.7  NEUTROABS 4.9  --   --   HGB 14.5 14.6 14.0  HCT 41.4 43.0 38.1*  MCV 86.4  --  83.6  PLT 228  --  205   Recent Labs  Lab 06/15/20 1635 06/15/20 2346 06/17/20 2129 06/18/20 0440 06/18/20 0749 06/18/20 2017 06/19/20 0940 06/20/20 0507  NA  --    < > 124*  --  125* 124* 123* 124*  K  --    < > 3.8  --  4.0 3.6 3.7 4.3  CL  --    < > 93*  --  91* 91* 91* 88*  CO2  --    < > 22  --  _0 GLUCOSE  --    < > 108*  --  98 105* 126* 105*  BUN  --    < > 12  --  _1 CREATININE  --    < >  0.88  --  0.91 0.84 0.82 0.85  CALCIUM  --    < > 8.7*  --  8.7* 8.6* 8.8* 8.8*  MG 2.1  --   --  2.0  --   --   --   --   PHOS 2.7  --   --  3.5  --   --   --   --    < > = values in this interval not displayed.    F/u labs ordered  Signed, Marlowe Aschoff  Peg Fifer, MD [facility] 06/20/2020

## 2020-06-20 NOTE — Progress Notes (Signed)
Inpatient Rehabilitation Admissions Coordinator  Inpatient rehab consult received. I met with patient at bedside for rehab assessment. He prefers direct d/c home and is asking for therapy to assess him when his shoes are on rather than just his socks. I have alerted therapy and will follow up after therapy updates today for further assessment. CIR bed not available today.  Danne Baxter, RN, MSN Rehab Admissions Coordinator [phone] 06/20/2020 11:57 AM

## 2020-06-20 NOTE — Plan of Care (Signed)

## 2020-06-20 NOTE — Plan of Care (Signed)
Adequate for discharge.

## 2020-06-20 NOTE — TOC Transition Note (Signed)
Transition of Care [facility]) - CM/SW Discharge Note   Patient Details  Name: Jermaine Soto MRN: [id number] Date of Birth: August 26, 1945  [facility] of [facility] CM/SW Contact:  Pollie Friar, RN Phone Number: 06/20/2020, 4:12 PM   Clinical Narrative:    Pt and wife have decided to d/c home and not CIR. Pt has recommended DME at home. Home health services arranged through [place]. Cory with [facility] accepted the referral. Pt has transportation home.   Final next level of care: Home w Home Health Services Barriers to Discharge: No Barriers Identified   Patient Goals and CMS Choice   CMS Medicare.gov Compare Post Acute Care list provided to:: Patient Represenative (must comment) Choice offered to / list presented to : [facility]  Discharge Placement                       Discharge Plan and Services                          HH Arranged: PT,OT,Social Work [place]: [place] Date [place]: 06/20/20   Representative spoke with at [place]: [place] ([place]) Interventions     Readmission Risk Interventions No flowsheet data found.

## 2020-06-20 NOTE — ED Provider Notes (Signed)
[facility] PROGRESSIVE CARE Provider Note   CSN: [id number] Arrival date & time: 06/15/20  1226     History Chief Complaint  Patient presents with  . Code Stroke    Jermaine Soto is a 75 y.o. male.  Patient presents as code stroke with EMS for aphasia and left gaze preference.  Patient on anticoagulant for atrial fibrillation.  Patient got dizzy followed by being sleepy and low blood pressure around 10:00 this morning.  No fever chills or breathing difficulty.  Symptoms gradually worsened since.  Unable to get details from patient due to acuity of presentation and neurologic changes.        Past Medical History:  Diagnosis Date  . Arthritis    hands   . Hypothyroidism     Patient Active Problem List   Diagnosis Date Noted  . Malnutrition of moderate degree 06/18/2020  . Acute metabolic encephalopathy 09/47/0962  . Hypothyroidism 06/18/2020  . Aortic atherosclerosis ([place]) 06/18/2020  . 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 Hyponatremia 06/02/2020  . Hypothyroid 06/02/2020  . Atrial fibrillation ([place]) 06/02/2020    Past Surgical History:  Procedure Laterality Date  . IR ANGIO INTRA EXTRACRAN SEL COM CAROTID INNOMINATE BILAT MOD SED  06/15/2020  . IR ANGIO VERTEBRAL SEL VERTEBRAL BILAT MOD SED  06/15/2020  . RADIOLOGY WITH ANESTHESIA N/A 06/15/2020   Procedure: IR WITH ANESTHESIA;  Surgeon: Radiologist, Medication, MD;  Location: [place];  Service: Radiology;  Laterality: N/A;  . ROBOT ASSISTED LAPAROSCOPIC RADICAL PROSTATECTOMY N/A 12/24/2012   Procedure: ROBOTIC ASSISTED LAPAROSCOPIC RADICAL PROSTATECTOMY;  Surgeon: Bernestine Amass, MD;  Location: [facility];  Service: Urology;  Laterality: N/A;       No family history on file.  Social History   Tobacco Use  . Smoking status: Never Smoker  . Smokeless tobacco: Never Used  Substance Use Topics  . Alcohol use: Yes    Comment: rare  . Drug use: No    Home Medications Prior to Admission medications    Medication Sig Start Date End Date Taking? Authorizing Provider  apixaban (ELIQUIS) 5 MG TABS tablet Take 1 tablet (5 mg total) by mouth 2 (two) times daily. 04/07/20  Yes Freada Bergeron, MD  Cholecalciferol (VITAMIN D3) 125 MCG (5000 UT) CAPS Take 5,000 Units by mouth daily.   Yes [provider]  diphenhydrAMINE HCl (ZZZQUIL) 50 MG/30ML LIQD Take 30 mLs by mouth as needed (sleep).   Yes [provider]  levothyroxine (SYNTHROID) 75 MCG tablet Take 0.5 tablets (37.5 mcg total) by mouth daily before breakfast. Patient taking differently: Take 37.5-75 mcg by mouth See admin instructions. 71mg on Tuesday and Thursday 37.580m every other day 04/08/20  Yes PeFreada BergeronMD  acetaminophen (TYLENOL) 325 MG tablet Take 325 mg by mouth every 6 (six) hours as needed for mild pain, fever or headache.    [provider]  metoprolol tartrate (LOPRESSOR) 25 MG tablet Take 0.5 tablets (12.5 mg total) by mouth 2 (two) times daily. Patient not taking: Reported on 06/15/2020 04/06/20   PeFreada BergeronMD    Allergies    Sulfa antibiotics  Review of Systems   Review of Systems  Unable to perform ROS: Acuity of condition    Physical Exam Updated Vital Signs BP (!) 129/99 (BP Location: Right Arm)   Pulse 64   Temp 97.8 F (36.6 C) (Oral)   Resp 18   Ht 5' 10"  (1.778 m)   Wt 82.4 kg  SpO2 100%   BMI 26.07 kg/m   Physical Exam Vitals and nursing note reviewed.  Constitutional:      Appearance: He is well-developed and well-nourished. He is ill-appearing.  HENT:     Head: Normocephalic.     Nose: No congestion.  Eyes:     General:        Right eye: No discharge.        Left eye: No discharge.     Conjunctiva/sclera: Conjunctivae normal.  Neck:     Trachea: No tracheal deviation.  Cardiovascular:     Rate and Rhythm: Normal rate.  Pulmonary:     Effort: Pulmonary effort is normal.  Abdominal:     General: There is no distension.      Palpations: Abdomen is soft.     Tenderness: There is no abdominal tenderness. There is no guarding.  Musculoskeletal:        General: No swelling or edema.     Cervical back: Normal range of motion and neck supple.  Skin:    General: Skin is warm.     Findings: No rash.  Neurological:     Cranial Nerves: Cranial nerve deficit present.     Comments: Difficult neuro exam due to aphasia, significant aphasia expressive and receptive.  Left gaze preference.  No seizure activity.  General weakness.  Psychiatric:        Mood and Affect: Mood and affect normal.     Comments: altered     ED Results / Procedures / Treatments   Labs (all labs ordered are listed, but only abnormal results are displayed) Labs Reviewed  COMPREHENSIVE METABOLIC PANEL - Abnormal; Notable for the following components:      Result Value   Sodium 121 (*)    Chloride 89 (*)    CO2 20 (*)    Glucose, Bld 160 (*)    Calcium 8.8 (*)    Total Protein 6.3 (*)    All other components within normal limits  PROTIME-INR - Abnormal; Notable for the following components:   Prothrombin Time 16.8 (*)    INR 1.4 (*)    All other components within normal limits  APTT - Abnormal; Notable for the following components:   aPTT 49 (*)    All other components within normal limits  URINALYSIS, COMPLETE (UACMP) WITH MICROSCOPIC - Abnormal; Notable for the following components:   Color, Urine STRAW (*)    All other components within normal limits  SODIUM - Abnormal; Notable for the following components:   Sodium 117 (*)    All other components within normal limits  BLOOD GAS, ARTERIAL - Abnormal; Notable for the following components:   pO2, Arterial 183 (*)    Acid-base deficit 2.8 (*)    All other components within normal limits  HEMOGLOBIN A1C - Abnormal; Notable for the following components:   Hgb A1c MFr Bld 5.7 (*)    All other components within normal limits  LIPID PANEL - Abnormal; Notable for the following components:    LDL Cholesterol 113 (*)    All other components within normal limits  SODIUM - Abnormal; Notable for the following components:   Sodium 121 (*)    All other components within normal limits  SODIUM - Abnormal; Notable for the following components:   Sodium 123 (*)    All other components within normal limits  BASIC METABOLIC PANEL - Abnormal; Notable for the following components:   Sodium 122 (*)    Chloride 93 (*)  CO2 19 (*)    Calcium 8.6 (*)    All other components within normal limits  OSMOLALITY, URINE - Abnormal; Notable for the following components:   Osmolality, Ur 289 (*)    All other components within normal limits  OSMOLALITY - Abnormal; Notable for the following components:   Osmolality 259 (*)    All other components within normal limits  SODIUM - Abnormal; Notable for the following components:   Sodium 124 (*)    All other components within normal limits  BASIC METABOLIC PANEL - Abnormal; Notable for the following components:   Sodium 124 (*)    Chloride 94 (*)    CO2 18 (*)    Calcium 8.5 (*)    All other components within normal limits  BASIC METABOLIC PANEL - Abnormal; Notable for the following components:   Sodium 124 (*)    Chloride 93 (*)    Glucose, Bld 108 (*)    Calcium 8.7 (*)    All other components within normal limits  GLUCOSE, CAPILLARY - Abnormal; Notable for the following components:   Glucose-Capillary 111 (*)    All other components within normal limits  CBC - Abnormal; Notable for the following components:   HCT 38.1 (*)    MCHC 36.7 (*)    All other components within normal limits  BASIC METABOLIC PANEL - Abnormal; Notable for the following components:   Sodium 125 (*)    Chloride 91 (*)    Calcium 8.7 (*)    All other components within normal limits  BASIC METABOLIC PANEL - Abnormal; Notable for the following components:   Sodium 124 (*)    Chloride 91 (*)    Glucose, Bld 105 (*)    Calcium 8.6 (*)    All other components  within normal limits  GLUCOSE, CAPILLARY - Abnormal; Notable for the following components:   Glucose-Capillary 101 (*)    All other components within normal limits  GLUCOSE, CAPILLARY - Abnormal; Notable for the following components:   Glucose-Capillary 102 (*)    All other components within normal limits  GLUCOSE, CAPILLARY - Abnormal; Notable for the following components:   Glucose-Capillary 140 (*)    All other components within normal limits  GLUCOSE, CAPILLARY - Abnormal; Notable for the following components:   Glucose-Capillary 108 (*)    All other components within normal limits  GLUCOSE, CAPILLARY - Abnormal; Notable for the following components:   Glucose-Capillary 104 (*)    All other components within normal limits  GLUCOSE, CAPILLARY - Abnormal; Notable for the following components:   Glucose-Capillary 105 (*)    All other components within normal limits  GLUCOSE, CAPILLARY - Abnormal; Notable for the following components:   Glucose-Capillary 130 (*)    All other components within normal limits  BASIC METABOLIC PANEL - Abnormal; Notable for the following components:   Sodium 123 (*)    Chloride 91 (*)    Glucose, Bld 126 (*)    Calcium 8.8 (*)    All other components within normal limits  BASIC METABOLIC PANEL - Abnormal; Notable for the following components:   Sodium 124 (*)    Chloride 88 (*)    Glucose, Bld 105 (*)    Calcium 8.8 (*)    All other components within normal limits  GLUCOSE, CAPILLARY - Abnormal; Notable for the following components:   Glucose-Capillary 123 (*)    All other components within normal limits  GLUCOSE, CAPILLARY - Abnormal; Notable for the following  components:   Glucose-Capillary 121 (*)    All other components within normal limits  GLUCOSE, CAPILLARY - Abnormal; Notable for the following components:   Glucose-Capillary 102 (*)    All other components within normal limits  I-STAT CHEM 8, ED - Abnormal; Notable for the following  components:   Sodium 121 (*)    Chloride 88 (*)    Glucose, Bld 166 (*)    Calcium, Ion 1.10 (*)    TCO2 21 (*)    All other components within normal limits  CBG MONITORING, ED - Abnormal; Notable for the following components:   Glucose-Capillary 153 (*)    All other components within normal limits  RESP PANEL BY RT-PCR (FLU A&B, COVID) ARPGX2  CULTURE, BLOOD (ROUTINE X 2)  CULTURE, BLOOD (ROUTINE X 2)  MRSA PCR SCREENING  CBC  DIFFERENTIAL  ETHANOL  TSH  PROCALCITONIN  MAGNESIUM  PHOSPHORUS  TRIGLYCERIDES  GLUCOSE, CAPILLARY  GLUCOSE, CAPILLARY  GLUCOSE, CAPILLARY  GLUCOSE, CAPILLARY  SODIUM, URINE, RANDOM  GLUCOSE, CAPILLARY  GLUCOSE, CAPILLARY  GLUCOSE, CAPILLARY  GLUCOSE, CAPILLARY  GLUCOSE, CAPILLARY  GLUCOSE, CAPILLARY  MAGNESIUM  PHOSPHORUS  GLUCOSE, CAPILLARY  GLUCOSE, CAPILLARY  GLUCOSE, CAPILLARY  GLUCOSE, CAPILLARY  GLUCOSE, CAPILLARY  GLUCOSE, CAPILLARY  GLUCOSE, CAPILLARY  TROPONIN I (HIGH SENSITIVITY)  TROPONIN I (HIGH SENSITIVITY)    EKG None  Radiology No results found.  Procedures Procedures (including critical care time)  Medications Ordered in ED Medications  ceFAZolin (ANCEF) 2-4 GM/100ML-% IVPB (has no administration in time range)  nitroGLYCERIN 100 mcg/mL intra-arterial injection (has no administration in time range)  chlorhexidine gluconate (MEDLINE KIT) (PERIDEX) 0.12 % solution 15 mL (15 mLs Mouth Rinse Given 06/20/20 0802)  clevidipine (CLEVIPREX) 0.5 MG/ML infusion (25 mg  Not Given 06/15/20 1700)  Chlorhexidine Gluconate Cloth 2 % PADS 6 each (6 each Topical Given 06/20/20 0802)  levothyroxine (SYNTHROID) tablet 37.5 mcg (37.5 mcg Oral Given 06/20/20 0512)  docusate sodium (COLACE) capsule 100 mg (100 mg Oral Given 06/20/20 0801)  polyethylene glycol (MIRALAX / GLYCOLAX) packet 17 g (17 g Oral Given 06/20/20 0802)  apixaban (ELIQUIS) tablet 5 mg (5 mg Oral Given 06/20/20 0802)  feeding supplement (ENSURE ENLIVE / ENSURE PLUS)  liquid 237 mL (237 mLs Oral Given 06/20/20 1450)  LORazepam (ATIVAN) injection 1 mg (1 mg Intravenous Given 06/15/20 1247)  iohexol (OMNIPAQUE) 350 MG/ML injection 75 mL (75 mLs Intravenous Contrast Given 06/15/20 1312)  iohexol (OMNIPAQUE) 300 MG/ML solution 150 mL (70 mLs Intra-arterial Contrast Given 06/15/20 1412)  iohexol (OMNIPAQUE) 300 MG/ML solution 75 mL (75 mLs Intravenous Contrast Given 06/16/20 1858)    ED Course  I have reviewed the triage vital signs and the nursing notes.  Pertinent labs & imaging results that were available during my care of the patient were reviewed by me and considered in my medical decision making (see chart for details).    MDM Rules/Calculators/A&P                          Patient presents as code stroke last known normal 10 AM. Patient taken immediately to CT scanner, initially airway being maintained. Differential broad however acutely concern for stroke thrombus versus bleed being on anticoagulant. Neurology also assessed in the ER and followed patient to CT scanner for which decision was made to go emergently to IR for possible thrombus removal. Sodium later returned low 121 reviewed.  Patient care was quickly transitioned to neurology for neuro critical  care.    Final Clinical Impression(s) / ED Diagnoses Final diagnoses:  Endotracheal tube present  Encounter for orogastric tube placement  Acute encephalopathy  Rx / DC Orders ED Discharge Orders    None       Elnora Morrison, MD 06/20/20 1541

## 2020-06-20 NOTE — Care Management Important Message (Signed)
Important Message  Patient Details  Name: Jermaine Soto MRN: [id number] Date of Birth: 21-Jun-1945   Medicare Important Message Given:  Yes     Faten Frieson [address] 06/20/2020, 4:01 PM

## 2020-06-22 ENCOUNTER — Other Ambulatory Visit: Payer: Medicare Other

## 2020-06-22 ENCOUNTER — Inpatient Hospital Stay: Admission: RE | Admit: 2020-06-22 | Payer: Medicare Other | Source: Ambulatory Visit

## 2020-06-23 ENCOUNTER — Encounter: Payer: Self-pay | Admitting: Cardiology

## 2020-06-23 ENCOUNTER — Other Ambulatory Visit: Payer: Self-pay

## 2020-06-23 ENCOUNTER — Ambulatory Visit (INDEPENDENT_AMBULATORY_CARE_PROVIDER_SITE_OTHER): Payer: Medicare Other | Admitting: Cardiology

## 2020-06-23 VITALS — BP 112/60 | HR 90 | Ht 70.0 in | Wt 174.8 lb

## 2020-06-23 DIAGNOSIS — E079 Disorder of thyroid, unspecified: Secondary | ICD-10-CM

## 2020-06-23 DIAGNOSIS — I4891 Unspecified atrial fibrillation: Secondary | ICD-10-CM | POA: Diagnosis not present

## 2020-06-23 DIAGNOSIS — G9341 Metabolic encephalopathy: Secondary | ICD-10-CM | POA: Diagnosis not present

## 2020-06-23 DIAGNOSIS — E871 Hypo-osmolality and hyponatremia: Secondary | ICD-10-CM | POA: Diagnosis not present

## 2020-06-23 DIAGNOSIS — M19041 Primary osteoarthritis, right hand: Secondary | ICD-10-CM | POA: Diagnosis not present

## 2020-06-23 DIAGNOSIS — Z9079 Acquired absence of other genital organ(s): Secondary | ICD-10-CM | POA: Diagnosis not present

## 2020-06-23 DIAGNOSIS — I6602 Occlusion and stenosis of left middle cerebral artery: Secondary | ICD-10-CM | POA: Diagnosis not present

## 2020-06-23 DIAGNOSIS — Z8546 Personal history of malignant neoplasm of prostate: Secondary | ICD-10-CM | POA: Diagnosis not present

## 2020-06-23 DIAGNOSIS — R9431 Abnormal electrocardiogram [ECG] [EKG]: Secondary | ICD-10-CM | POA: Diagnosis not present

## 2020-06-23 DIAGNOSIS — M19042 Primary osteoarthritis, left hand: Secondary | ICD-10-CM | POA: Diagnosis not present

## 2020-06-23 DIAGNOSIS — E039 Hypothyroidism, unspecified: Secondary | ICD-10-CM | POA: Diagnosis not present

## 2020-06-23 DIAGNOSIS — Z7901 Long term (current) use of anticoagulants: Secondary | ICD-10-CM | POA: Diagnosis not present

## 2020-06-23 DIAGNOSIS — Z6823 Body mass index (BMI) 23.0-23.9, adult: Secondary | ICD-10-CM | POA: Diagnosis not present

## 2020-06-23 DIAGNOSIS — I700 Atherosclerosis of aorta: Secondary | ICD-10-CM | POA: Diagnosis not present

## 2020-06-23 DIAGNOSIS — R634 Abnormal weight loss: Secondary | ICD-10-CM

## 2020-06-23 DIAGNOSIS — E440 Moderate protein-calorie malnutrition: Secondary | ICD-10-CM | POA: Diagnosis not present

## 2020-06-23 DIAGNOSIS — Z9181 History of falling: Secondary | ICD-10-CM | POA: Diagnosis not present

## 2020-06-23 MED ORDER — ROSUVASTATIN CALCIUM 10 MG PO TABS: 10.0000 mg | ORAL_TABLET | Freq: Every day | ORAL | 3 refills | Status: DC

## 2020-06-23 MED ORDER — LEVOTHYROXINE SODIUM 75 MCG PO TABS: ORAL_TABLET | ORAL | 3 refills | Status: DC

## 2020-06-23 MED ORDER — APIXABAN 5 MG PO TABS: 5.0000 mg | ORAL_TABLET | Freq: Two times a day (BID) | ORAL | 1 refills | Status: DC

## 2020-06-23 NOTE — Patient Instructions (Signed)
Medication Instructions:  1) START Rosuvastatin 10mg  once daily  *If you need a refill on your cardiac medications before your next appointment, please call your pharmacy*   Lab Work: Come fasting to your next appointment and we will plan to draw labs while you are here.  Nothing to eat or drink after midnight except water and black coffee.  If you have labs (blood work) drawn today and your tests are completely normal, you will receive your results only by: Marland Kitchen MyChart Message (if you have MyChart) OR . A paper copy in the mail If you have any lab test that is abnormal or we need to change your treatment, we will call you to review the results.   Testing/Procedures: None   Follow-Up: At [facility], you and your health needs are our priority.  As part of our continuing mission to provide you with exceptional heart care, we have created designated Provider Care Teams.  These Care Teams include your primary Cardiologist (physician) and Advanced Practice Providers (APPs -  Physician Assistants and Nurse Practitioners) who all work together to provide you with the care you need, when you need it.  We recommend signing up for the patient portal called "MyChart".  Sign up information is provided on this After Visit Summary.  MyChart is used to connect with patients for Virtual Visits (Telemedicine).  Patients are able to view lab/test results, encounter notes, upcoming appointments, etc.  Non-urgent messages can be sent to your provider as well.   To learn more about what you can do with MyChart, go to [email or web address].    Your next appointment:   2 month(s)  The format for your next appointment:   In Person  Provider:   You may see Freada Bergeron, MD or one of the following Advanced Practice Providers on your designated Care Team:    Richardson Dopp, PA-C  Robbie Lis, [place]    Other Instructions

## 2020-06-24 ENCOUNTER — Ambulatory Visit (INDEPENDENT_AMBULATORY_CARE_PROVIDER_SITE_OTHER): Payer: Medicare Other | Admitting: Endocrinology

## 2020-06-24 ENCOUNTER — Encounter: Payer: Self-pay | Admitting: Endocrinology

## 2020-06-24 VITALS — BP 118/82 | HR 95 | Ht 70.0 in | Wt 173.0 lb

## 2020-06-24 DIAGNOSIS — G9341 Metabolic encephalopathy: Secondary | ICD-10-CM | POA: Diagnosis not present

## 2020-06-24 DIAGNOSIS — E871 Hypo-osmolality and hyponatremia: Secondary | ICD-10-CM

## 2020-06-24 DIAGNOSIS — R221 Localized swelling, mass and lump, neck: Secondary | ICD-10-CM | POA: Diagnosis not present

## 2020-06-24 DIAGNOSIS — I4891 Unspecified atrial fibrillation: Secondary | ICD-10-CM | POA: Diagnosis not present

## 2020-06-24 DIAGNOSIS — R634 Abnormal weight loss: Secondary | ICD-10-CM | POA: Diagnosis not present

## 2020-06-24 DIAGNOSIS — E440 Moderate protein-calorie malnutrition: Secondary | ICD-10-CM | POA: Diagnosis not present

## 2020-06-24 DIAGNOSIS — E236 Other disorders of pituitary gland: Secondary | ICD-10-CM | POA: Diagnosis not present

## 2020-06-24 DIAGNOSIS — I639 Cerebral infarction, unspecified: Secondary | ICD-10-CM

## 2020-06-24 DIAGNOSIS — I6602 Occlusion and stenosis of left middle cerebral artery: Secondary | ICD-10-CM | POA: Diagnosis not present

## 2020-06-24 LAB — LUTEINIZING HORMONE: LH: 1.68 m[IU]/mL — ABNORMAL LOW (ref 3.10–34.60)

## 2020-06-24 LAB — FOLLICLE STIMULATING HORMONE: FSH: 3.1 m[IU]/mL (ref 1.4–18.1)

## 2020-06-24 LAB — CORTISOL: Cortisol, Plasma: 2.7 ug/dL

## 2020-06-24 MED ORDER — DEMECLOCYCLINE HCL 300 MG PO TABS: 300.0000 mg | ORAL_TABLET | Freq: Two times a day (BID) | ORAL | 11 refills | Status: DC

## 2020-06-24 NOTE — Patient Instructions (Addendum)
Let's check the ultrasound.  you will receive a phone call, about a day and time for an appointment. Blood tests are requested for you today.  We'll let you know about the results.  I have sent a prescription to your pharmacy, to help the sodium. Please come back for a follow-up appointment in 2 weeks.

## 2020-06-24 NOTE — Progress Notes (Signed)
Subjective:    Patient ID: Jermaine Soto, male    DOB: Oct 14, 1945, 75 y.o.   MRN: [id number]  HPI Wife provides hx, due to pt's memory loss.  Pt is referred by for neck abnormality.  Pt reports 6 mos of swelling at the ant neck.  He has no h/o XRT or surgery to the neck.  he has been on synthroid since 2016, for hypothyroidism.  He has lost 50 lbs x 10 months--unintentional.   At the end of visit, wife asks about hyponatremia:  Pt is referred by for hyponatremia.  Pt was first noted to have hyponatremia in 2014.  Pt denies ACEI, duiretic, adrenal insuff, SIADH, cirrhosis, CHF, nephrotic synd, diarrhea, n/v, brain injury, pseudo, psychogenic polydipsia, pulm dz, chemotx, narcotics, SSRI, GBS.  Pt reports he has been on 1500 cc fluid restr x 1 week.  He reports generalized weakness.  Past Medical History:  Diagnosis Date  . Arthritis    hands   . Hypothyroidism     Past Surgical History:  Procedure Laterality Date  . IR ANGIO INTRA EXTRACRAN SEL COM CAROTID INNOMINATE BILAT MOD SED  06/15/2020  . IR ANGIO VERTEBRAL SEL VERTEBRAL BILAT MOD SED  06/15/2020  . RADIOLOGY WITH ANESTHESIA N/A 06/15/2020   Procedure: IR WITH ANESTHESIA;  Surgeon: Radiologist, Medication, MD;  Location: [place];  Service: Radiology;  Laterality: N/A;  . ROBOT ASSISTED LAPAROSCOPIC RADICAL PROSTATECTOMY N/A 12/24/2012   Procedure: ROBOTIC ASSISTED LAPAROSCOPIC RADICAL PROSTATECTOMY;  Surgeon: Bernestine Amass, MD;  Location: [facility];  Service: Urology;  Laterality: N/A;    Social History   Socioeconomic History  . Marital status: Married    Spouse name: Not on file  . Number of children: Not on file  . Years of education: Not on file  . Highest education level: Not on file  Occupational History  . Not on file  Tobacco Use  . Smoking status: Never Smoker  . Smokeless tobacco: Never Used  Substance and Sexual Activity  . Alcohol use: Yes    Comment: rare  . Drug use: No  . Sexual activity: Not on file  Other Topics  Concern  . Not on file  Social History Narrative  . Not on file   Social Determinants of Health   Financial Resource Strain: Not on file  Food Insecurity: Not on file  Transportation Needs: Not on file  Physical Activity: Not on file  Stress: Not on file  Social Connections: Not on file  Intimate Partner Violence: Not on file    Current Outpatient Medications on File Prior to Visit  Medication Sig Dispense Refill  . acetaminophen (TYLENOL) 325 MG tablet Take 325 mg by mouth every 6 (six) hours as needed for mild pain, fever or headache.    Marland Kitchen apixaban (ELIQUIS) 5 MG TABS tablet Take 1 tablet (5 mg total) by mouth 2 (two) times daily. 180 tablet 1  . Cholecalciferol (VITAMIN D3) 125 MCG (5000 UT) CAPS Take 5,000 Units by mouth daily.    . diphenhydrAMINE HCl (ZZZQUIL) 50 MG/30ML LIQD Take 30 mLs by mouth as needed (sleep).    Marland Kitchen levothyroxine (SYNTHROID) 75 MCG tablet Take 43mcg by mouth on Tuesday and Thursday. Take 37.35mcg by mouth on all other days 55 tablet 3  . rosuvastatin (CRESTOR) 10 MG tablet Take 1 tablet (10 mg total) by mouth daily. 90 tablet 3   No current facility-administered medications on file prior to visit.    Allergies  Allergen Reactions  .  Sulfa Antibiotics     Unknown    Family History  Problem Relation Age of Onset  . Thyroid disease Neg Hx     BP 118/82   Pulse 95   Ht 5\' 10"  (1.778 m)   Wt 173 lb (78.5 kg)   SpO2 99%   BMI 24.82 kg/m    Review of Systems Denies hoarseness, neck pain, and sob.    Objective:   Physical Exam VITAL SIGNS:  See vs page GENERAL: no distress NECK: There is no palpable thyroid enlargement.  No thyroid nodule is palpable.  No palpable lymphadenopathy at the anterior neck.   Ext: no leg edema.  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       Assessment & Plan:  Hyponatremia, new to me.  Urine is inappropriately concentrated.   Sensation of swelling at ant neck, uncertain etiology and prognosis. 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    Patient Instructions  Let's check the ultrasound.  you will receive a phone call, about a day and time for an appointment. Blood tests are requested for you today.  We'll let you know about the results.  I have sent a prescription to your pharmacy, to help the sodium. Please come back for a follow-up appointment in 2 weeks.

## 2020-06-25 LAB — SPECIMEN STATUS REPORT

## 2020-06-26 DIAGNOSIS — E236 Other disorders of pituitary gland: Secondary | ICD-10-CM | POA: Insufficient documentation

## 2020-06-26 LAB — TESTOSTERONE,FREE AND TOTAL

## 2020-06-28 ENCOUNTER — Encounter: Payer: Self-pay | Admitting: Internal Medicine

## 2020-06-28 ENCOUNTER — Other Ambulatory Visit: Payer: Self-pay

## 2020-06-28 ENCOUNTER — Ambulatory Visit (INDEPENDENT_AMBULATORY_CARE_PROVIDER_SITE_OTHER): Payer: Medicare Other | Admitting: Internal Medicine

## 2020-06-28 ENCOUNTER — Ambulatory Visit: Payer: Medicare Other | Admitting: Family

## 2020-06-28 ENCOUNTER — Telehealth: Payer: Self-pay | Admitting: Family

## 2020-06-28 VITALS — BP 124/70 | HR 76 | Temp 97.7°F | Resp 18 | Ht 70.0 in | Wt 173.0 lb

## 2020-06-28 DIAGNOSIS — I639 Cerebral infarction, unspecified: Secondary | ICD-10-CM | POA: Diagnosis not present

## 2020-06-28 DIAGNOSIS — E871 Hypo-osmolality and hyponatremia: Secondary | ICD-10-CM

## 2020-06-28 DIAGNOSIS — E039 Hypothyroidism, unspecified: Secondary | ICD-10-CM | POA: Diagnosis not present

## 2020-06-28 DIAGNOSIS — R221 Localized swelling, mass and lump, neck: Secondary | ICD-10-CM

## 2020-06-28 LAB — CBC
HCT: 37.6 % — ABNORMAL LOW (ref 39.0–52.0)
Hemoglobin: 12.6 g/dL — ABNORMAL LOW (ref 13.0–17.0)
MCHC: 33.6 g/dL (ref 30.0–36.0)
MCV: 89.1 fl (ref 78.0–100.0)
Platelets: 255 10*3/uL (ref 150.0–400.0)
RBC: 4.22 Mil/uL (ref 4.22–5.81)
RDW: 14.2 % (ref 11.5–15.5)
WBC: 7.4 10*3/uL (ref 4.0–10.5)

## 2020-06-28 LAB — COMPREHENSIVE METABOLIC PANEL
ALT: 12 U/L (ref 0–53)
AST: 19 U/L (ref 0–37)
Albumin: 4.3 g/dL (ref 3.5–5.2)
Alkaline Phosphatase: 38 U/L — ABNORMAL LOW (ref 39–117)
BUN: 22 mg/dL (ref 6–23)
CO2: 32 mEq/L (ref 19–32)
Calcium: 9.8 mg/dL (ref 8.4–10.5)
Chloride: 98 mEq/L (ref 96–112)
Creatinine, Ser: 0.98 mg/dL (ref 0.40–1.50)
GFR: 75.95 mL/min (ref >60.00)
Glucose, Bld: 102 mg/dL — ABNORMAL HIGH (ref 70–99)
Potassium: 4.3 mEq/L (ref 3.5–5.1)
Sodium: 135 mEq/L (ref 135–145)
Total Bilirubin: 0.5 mg/dL (ref 0.2–1.2)
Total Protein: 7 g/dL (ref 6.0–8.3)

## 2020-06-28 NOTE — Patient Instructions (Addendum)
Tolvaptan is the other option for the medicine.   We will wait on the US of the thyroid.  We are checking the blood work today.

## 2020-06-28 NOTE — Assessment & Plan Note (Signed)
They are doing fluid restriction. 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 Tolvaptan is a recommended medication if needed and they will check on coverage in case they cannot obtain the ordered medication. Rechecking sodium level today.

## 2020-06-28 NOTE — Assessment & Plan Note (Signed)
Await US thyroid, CT chest without masses or findings.

## 2020-06-28 NOTE — Assessment & Plan Note (Signed)
Recent thyroid levels are back to normal. He is taking synthroid 75 mcg daily.

## 2020-06-28 NOTE — Telephone Encounter (Signed)
Jermaine Soto is requesting verbals for OT 1w2, 0w1, 1w1, for ADL transfers and exercise.

## 2020-06-28 NOTE — Progress Notes (Signed)
   Subjective:   Patient ID: Jermaine Soto, male    DOB: 1945/10/13, 75 y.o.   MRN: [id number]  HPI The patient is a 75 YO man coming in for hospital follow up. Was admitted with low sodium level to 117 and was in ICU for concern of stroke but MRI without stroke. EEG non-specific. CT chest, abdomen and pelvis not revealing. 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 Seen by endo about 3-4 days ago and they did labs some of which are still pending. He still has US thyroid pending for feeling of fullness front of neck. Has seen endo and they gave rx for democlocycline which the pharmacy did not have so they are waiting for it to come in. He is feeling okay. Eating and drinking well. They are adhering to fluid restriction. Denies chest pains or headaches. Denies abdominal pain or nausea or vomiting or diarrhea or constipation.   PMH, [place], social history reviewed and updated  Review of Systems  Constitutional: Negative.   HENT: Negative.   Eyes: Negative.   Respiratory: Negative for cough, chest tightness and shortness of breath.   Cardiovascular: Negative for chest pain, palpitations and leg swelling.  Gastrointestinal: Negative for abdominal distention, abdominal pain, constipation, diarrhea, nausea and vomiting.  Musculoskeletal: Negative.   Skin: Negative.   Neurological: Negative.   Psychiatric/Behavioral: Negative.     Objective:  Physical Exam Constitutional:      Appearance: He is well-developed and well-nourished.  HENT:     Head: Normocephalic and atraumatic.  Eyes:     Extraocular Movements: EOM normal.  Cardiovascular:     Rate and Rhythm: Normal rate and regular rhythm.  Pulmonary:     Effort: Pulmonary effort is normal. No respiratory distress.     Breath sounds: Normal breath sounds. No wheezing or rales.  Abdominal:     General: Bowel sounds are normal. There is no distension.     Palpations: Abdomen is soft.     Tenderness: There is no abdominal tenderness. There is no rebound.   Musculoskeletal:        General: No edema.     Cervical back: Normal range of motion.  Skin:    General: Skin is warm and dry.  Neurological:     Mental Status: He is alert and oriented to person, place, and time.     Coordination: Coordination abnormal.     Comments: Slow to stand, slightly unstable  Psychiatric:        Mood and Affect: Mood and affect normal.     Vitals:   06/28/20 1214  BP: 124/70  Pulse: 76  Resp: 18  Temp: 97.7 F (36.5 C)  TempSrc: Oral  SpO2: 97%  Weight: 173 lb (78.5 kg)  Height: 5\' 10"  (1.778 m)    This visit occurred during the SARS-CoV-2 public health emergency.  Safety protocols were in place, including screening questions prior to the visit, additional usage of staff PPE, and extensive cleaning of exam room while observing appropriate contact time as indicated for disinfecting solutions.   Assessment & Plan:

## 2020-06-28 NOTE — Telephone Encounter (Signed)
Left VM that okay to give orders as requested.

## 2020-06-29 ENCOUNTER — Other Ambulatory Visit: Payer: Self-pay | Admitting: Internal Medicine

## 2020-06-29 ENCOUNTER — Encounter: Payer: Self-pay | Admitting: Internal Medicine

## 2020-06-29 DIAGNOSIS — E440 Moderate protein-calorie malnutrition: Secondary | ICD-10-CM | POA: Diagnosis not present

## 2020-06-29 DIAGNOSIS — R634 Abnormal weight loss: Secondary | ICD-10-CM | POA: Diagnosis not present

## 2020-06-29 DIAGNOSIS — E871 Hypo-osmolality and hyponatremia: Secondary | ICD-10-CM | POA: Diagnosis not present

## 2020-06-29 DIAGNOSIS — I6602 Occlusion and stenosis of left middle cerebral artery: Secondary | ICD-10-CM | POA: Diagnosis not present

## 2020-06-29 DIAGNOSIS — I4891 Unspecified atrial fibrillation: Secondary | ICD-10-CM | POA: Diagnosis not present

## 2020-06-29 DIAGNOSIS — G9341 Metabolic encephalopathy: Secondary | ICD-10-CM | POA: Diagnosis not present

## 2020-06-29 LAB — ARGININE VASOPRESSIN HORMONE: Osmolality Meas: 277 mOsmol/kg — ABNORMAL LOW (ref 280–301)

## 2020-06-29 LAB — ACTH: C206 ACTH: 11 pg/mL (ref 6–50)

## 2020-06-29 LAB — TESTOSTERONE,FREE AND TOTAL
Testosterone, Free: <0.2 pg/mL — ABNORMAL LOW (ref 6.6–18.1)
Testosterone: 9 ng/dL — ABNORMAL LOW (ref 264–916)

## 2020-06-29 LAB — PROLACTIN: Prolactin: 4.8 ng/mL (ref 2.0–18.0)

## 2020-07-01 ENCOUNTER — Ambulatory Visit: Payer: Medicare Other | Admitting: Family

## 2020-07-01 DIAGNOSIS — I4891 Unspecified atrial fibrillation: Secondary | ICD-10-CM | POA: Diagnosis not present

## 2020-07-01 DIAGNOSIS — E440 Moderate protein-calorie malnutrition: Secondary | ICD-10-CM | POA: Diagnosis not present

## 2020-07-01 DIAGNOSIS — E871 Hypo-osmolality and hyponatremia: Secondary | ICD-10-CM | POA: Diagnosis not present

## 2020-07-01 DIAGNOSIS — R634 Abnormal weight loss: Secondary | ICD-10-CM | POA: Diagnosis not present

## 2020-07-01 DIAGNOSIS — G9341 Metabolic encephalopathy: Secondary | ICD-10-CM | POA: Diagnosis not present

## 2020-07-01 DIAGNOSIS — I6602 Occlusion and stenosis of left middle cerebral artery: Secondary | ICD-10-CM | POA: Diagnosis not present

## 2020-07-01 LAB — ADH: ADH: <0.8 pg/mL (ref 0.0–4.7)

## 2020-07-01 LAB — SPECIMEN STATUS REPORT

## 2020-07-04 ENCOUNTER — Ambulatory Visit: Payer: Medicare Other | Admitting: Cardiology

## 2020-07-07 DIAGNOSIS — R634 Abnormal weight loss: Secondary | ICD-10-CM | POA: Diagnosis not present

## 2020-07-07 DIAGNOSIS — I4891 Unspecified atrial fibrillation: Secondary | ICD-10-CM | POA: Diagnosis not present

## 2020-07-07 DIAGNOSIS — E440 Moderate protein-calorie malnutrition: Secondary | ICD-10-CM | POA: Diagnosis not present

## 2020-07-07 DIAGNOSIS — G9341 Metabolic encephalopathy: Secondary | ICD-10-CM | POA: Diagnosis not present

## 2020-07-07 DIAGNOSIS — I6602 Occlusion and stenosis of left middle cerebral artery: Secondary | ICD-10-CM | POA: Diagnosis not present

## 2020-07-07 DIAGNOSIS — E871 Hypo-osmolality and hyponatremia: Secondary | ICD-10-CM | POA: Diagnosis not present

## 2020-07-08 ENCOUNTER — Other Ambulatory Visit (INDEPENDENT_AMBULATORY_CARE_PROVIDER_SITE_OTHER): Payer: Medicare Other

## 2020-07-08 DIAGNOSIS — E871 Hypo-osmolality and hyponatremia: Secondary | ICD-10-CM

## 2020-07-08 LAB — BASIC METABOLIC PANEL
BUN: 21 mg/dL (ref 6–23)
CO2: 31 mEq/L (ref 19–32)
Calcium: 9.9 mg/dL (ref 8.4–10.5)
Chloride: 98 mEq/L (ref 96–112)
Creatinine, Ser: 1.11 mg/dL (ref 0.40–1.50)
GFR: 65.39 mL/min (ref >60.00)
Glucose, Bld: 121 mg/dL — ABNORMAL HIGH (ref 70–99)
Potassium: 4.4 mEq/L (ref 3.5–5.1)
Sodium: 135 mEq/L (ref 135–145)

## 2020-07-11 DIAGNOSIS — M19042 Primary osteoarthritis, left hand: Secondary | ICD-10-CM | POA: Diagnosis not present

## 2020-07-11 DIAGNOSIS — E440 Moderate protein-calorie malnutrition: Secondary | ICD-10-CM | POA: Diagnosis not present

## 2020-07-11 DIAGNOSIS — Z9079 Acquired absence of other genital organ(s): Secondary | ICD-10-CM

## 2020-07-11 DIAGNOSIS — I700 Atherosclerosis of aorta: Secondary | ICD-10-CM | POA: Diagnosis not present

## 2020-07-11 DIAGNOSIS — Z9181 History of falling: Secondary | ICD-10-CM

## 2020-07-11 DIAGNOSIS — M19041 Primary osteoarthritis, right hand: Secondary | ICD-10-CM | POA: Diagnosis not present

## 2020-07-11 DIAGNOSIS — G9341 Metabolic encephalopathy: Secondary | ICD-10-CM | POA: Diagnosis not present

## 2020-07-11 DIAGNOSIS — Z8546 Personal history of malignant neoplasm of prostate: Secondary | ICD-10-CM

## 2020-07-11 DIAGNOSIS — Z7901 Long term (current) use of anticoagulants: Secondary | ICD-10-CM | POA: Diagnosis not present

## 2020-07-11 DIAGNOSIS — R634 Abnormal weight loss: Secondary | ICD-10-CM | POA: Diagnosis not present

## 2020-07-11 DIAGNOSIS — I6602 Occlusion and stenosis of left middle cerebral artery: Secondary | ICD-10-CM | POA: Diagnosis not present

## 2020-07-11 DIAGNOSIS — E039 Hypothyroidism, unspecified: Secondary | ICD-10-CM | POA: Diagnosis not present

## 2020-07-11 DIAGNOSIS — E871 Hypo-osmolality and hyponatremia: Secondary | ICD-10-CM | POA: Diagnosis not present

## 2020-07-11 DIAGNOSIS — I4891 Unspecified atrial fibrillation: Secondary | ICD-10-CM | POA: Diagnosis not present

## 2020-07-11 DIAGNOSIS — R9431 Abnormal electrocardiogram [ECG] [EKG]: Secondary | ICD-10-CM | POA: Diagnosis not present

## 2020-07-11 DIAGNOSIS — Z6823 Body mass index (BMI) 23.0-23.9, adult: Secondary | ICD-10-CM

## 2020-07-12 ENCOUNTER — Ambulatory Visit
Admission: RE | Admit: 2020-07-12 | Discharge: 2020-07-12 | Disposition: A | Discharge status: Home / Self Care | Payer: Medicare Other | Source: Ambulatory Visit | Attending: Endocrinology | Admitting: Endocrinology

## 2020-07-12 DIAGNOSIS — R221 Localized swelling, mass and lump, neck: Secondary | ICD-10-CM

## 2020-07-12 DIAGNOSIS — E041 Nontoxic single thyroid nodule: Secondary | ICD-10-CM | POA: Diagnosis not present

## 2020-07-13 ENCOUNTER — Other Ambulatory Visit: Payer: Self-pay | Admitting: Family

## 2020-07-13 ENCOUNTER — Telehealth: Payer: Self-pay | Admitting: Family

## 2020-07-13 DIAGNOSIS — I4891 Unspecified atrial fibrillation: Secondary | ICD-10-CM | POA: Diagnosis not present

## 2020-07-13 DIAGNOSIS — G9341 Metabolic encephalopathy: Secondary | ICD-10-CM | POA: Diagnosis not present

## 2020-07-13 DIAGNOSIS — R634 Abnormal weight loss: Secondary | ICD-10-CM | POA: Diagnosis not present

## 2020-07-13 DIAGNOSIS — Z7409 Other reduced mobility: Secondary | ICD-10-CM

## 2020-07-13 DIAGNOSIS — E871 Hypo-osmolality and hyponatremia: Secondary | ICD-10-CM | POA: Diagnosis not present

## 2020-07-13 DIAGNOSIS — I6602 Occlusion and stenosis of left middle cerebral artery: Secondary | ICD-10-CM | POA: Diagnosis not present

## 2020-07-13 DIAGNOSIS — E440 Moderate protein-calorie malnutrition: Secondary | ICD-10-CM | POA: Diagnosis not present

## 2020-07-13 NOTE — Telephone Encounter (Signed)
Belenda Cruise from [facility] calling, she is discharging PT today and would like to get the patient a outpatient PT referral to [facility], the referral can be faxed to [phone] Also letting us know the patient is being discharged from OT Friday. Belenda Cruise can be reached at [phone]

## 2020-07-15 ENCOUNTER — Other Ambulatory Visit: Payer: Self-pay

## 2020-07-15 ENCOUNTER — Ambulatory Visit: Payer: Medicare Other | Admitting: Endocrinology

## 2020-07-15 DIAGNOSIS — I4891 Unspecified atrial fibrillation: Secondary | ICD-10-CM | POA: Diagnosis not present

## 2020-07-15 DIAGNOSIS — E871 Hypo-osmolality and hyponatremia: Secondary | ICD-10-CM | POA: Diagnosis not present

## 2020-07-15 DIAGNOSIS — I6602 Occlusion and stenosis of left middle cerebral artery: Secondary | ICD-10-CM | POA: Diagnosis not present

## 2020-07-15 DIAGNOSIS — R634 Abnormal weight loss: Secondary | ICD-10-CM | POA: Diagnosis not present

## 2020-07-15 DIAGNOSIS — E440 Moderate protein-calorie malnutrition: Secondary | ICD-10-CM | POA: Diagnosis not present

## 2020-07-15 DIAGNOSIS — G9341 Metabolic encephalopathy: Secondary | ICD-10-CM | POA: Diagnosis not present

## 2020-07-19 ENCOUNTER — Ambulatory Visit (INDEPENDENT_AMBULATORY_CARE_PROVIDER_SITE_OTHER): Payer: Medicare Other | Admitting: Endocrinology

## 2020-07-19 ENCOUNTER — Other Ambulatory Visit: Payer: Self-pay

## 2020-07-19 ENCOUNTER — Encounter: Payer: Self-pay | Admitting: Endocrinology

## 2020-07-19 VITALS — BP 128/82 | HR 62 | Ht 70.0 in | Wt 176.0 lb

## 2020-07-19 DIAGNOSIS — E236 Other disorders of pituitary gland: Secondary | ICD-10-CM | POA: Diagnosis not present

## 2020-07-19 DIAGNOSIS — E871 Hypo-osmolality and hyponatremia: Secondary | ICD-10-CM | POA: Diagnosis not present

## 2020-07-19 DIAGNOSIS — I639 Cerebral infarction, unspecified: Secondary | ICD-10-CM

## 2020-07-19 LAB — T4, FREE: Free T4: 0.49 ng/dL — ABNORMAL LOW (ref 0.60–1.60)

## 2020-07-19 LAB — BASIC METABOLIC PANEL
BUN: 22 mg/dL (ref 6–23)
CO2: 29 mEq/L (ref 19–32)
Calcium: 9.5 mg/dL (ref 8.4–10.5)
Chloride: 97 mEq/L (ref 96–112)
Creatinine, Ser: 1.06 mg/dL (ref 0.40–1.50)
GFR: 69.1 mL/min (ref >60.00)
Glucose, Bld: 103 mg/dL — ABNORMAL HIGH (ref 70–99)
Potassium: 4.2 mEq/L (ref 3.5–5.1)
Sodium: 132 mEq/L — ABNORMAL LOW (ref 135–145)

## 2020-07-19 LAB — CORTISOL
Cortisol, Plasma: 2.7 ug/dL
Cortisol, Plasma: 10.7 ug/dL

## 2020-07-19 LAB — TSH: TSH: 0.82 u[IU]/mL (ref 0.35–4.50)

## 2020-07-19 MED ORDER — COSYNTROPIN 0.25 MG IJ SOLR
0.2500 mg | Freq: Once | INTRAMUSCULAR | Status: AC
  Administered 2020-07-19: 0.25 mg via INTRAVENOUS

## 2020-07-19 MED ORDER — LEVOTHYROXINE SODIUM 100 MCG PO TABS: 100.0000 ug | ORAL_TABLET | Freq: Every day | ORAL | 3 refills | Status: DC

## 2020-07-19 MED ORDER — PREDNISONE 5 MG PO TABS
5.0000 mg | ORAL_TABLET | Freq: Every day | ORAL | 3 refills | Status: DC
Start: 2020-07-19 — End: 2021-07-11

## 2020-07-19 NOTE — Progress Notes (Signed)
Subjective:    Patient ID: Jermaine Soto, male    DOB: 1945-10-05, 75 y.o.   MRN: [id number]  HPI Wife provides hx, due to pt's memory loss.   Pt has MNG (dx'ed 2021; [place] in 2022 advises f/u in 1 year) Hypothyroidism: he has been on synthroid since 2016. Pt also has hyponatremia (dx'ed 2014; pt denies ACEI, duiretic, adrenal insuff, SIADH, cirrhosis, CHF, nephrotic synd, diarrhea, n/v, brain injury, pseudo, psychogenic polydipsia, pulm dz, chemotx, narcotics, SSRI, or GBS; he is on 1500 cc fluid restr).  He stopped declomycin, due to cost.  pt states he feels well in general He also has pituitary cyst.  He has these pituitary functions: ACTH: needs and takes prednisone Prolactin normal FSH/LH: low (he has h/o prostate cancer, so not a candidate for rx)  TSH needs and takes synthroid VP: low and sodium is low.  Past Medical History:  Diagnosis Date  . Arthritis    hands   . Hypothyroidism     Past Surgical History:  Procedure Laterality Date  . IR ANGIO INTRA EXTRACRAN SEL COM CAROTID INNOMINATE BILAT MOD SED  06/15/2020  . IR ANGIO VERTEBRAL SEL VERTEBRAL BILAT MOD SED  06/15/2020  . RADIOLOGY WITH ANESTHESIA N/A 06/15/2020   Procedure: IR WITH ANESTHESIA;  Surgeon: Radiologist, Medication, MD;  Location: [facility];  Service: Radiology;  Laterality: N/A;  . ROBOT ASSISTED LAPAROSCOPIC RADICAL PROSTATECTOMY N/A 12/24/2012   Procedure: ROBOTIC ASSISTED LAPAROSCOPIC RADICAL PROSTATECTOMY;  Surgeon: Bernestine Amass, MD;  Location: [facility];  Service: Urology;  Laterality: N/A;    Social History   Socioeconomic History  . Marital status: Married    Spouse name: Not on file  . Number of children: Not on file  . Years of education: Not on file  . Highest education level: Not on file  Occupational History  . Not on file  Tobacco Use  . Smoking status: Never Smoker  . Smokeless tobacco: Never Used  Substance and Sexual Activity  . Alcohol use: Yes    Comment: rare  . Drug use: No  . Sexual  activity: Not on file  Other Topics Concern  . Not on file  Social History Narrative  . Not on file   Social Determinants of Health   Financial Resource Strain: Not on file  Food Insecurity: Not on file  Transportation Needs: Not on file  Physical Activity: Not on file  Stress: Not on file  Social Connections: Not on file  Intimate Partner Violence: Not on file    Current Outpatient Medications on File Prior to Visit  Medication Sig Dispense Refill  . acetaminophen (TYLENOL) 325 MG tablet Take 325 mg by mouth every 6 (six) hours as needed for mild pain, fever or headache.    Marland Kitchen apixaban (ELIQUIS) 5 MG TABS tablet Take 1 tablet (5 mg total) by mouth 2 (two) times daily. 180 tablet 1  . Cholecalciferol (VITAMIN D3) 125 MCG (5000 UT) CAPS Take 5,000 Units by mouth daily.    . diphenhydrAMINE HCl (ZZZQUIL) 50 MG/30ML LIQD Take 30 mLs by mouth as needed (sleep).    . rosuvastatin (CRESTOR) 10 MG tablet Take 1 tablet (10 mg total) by mouth daily. 90 tablet 3   No current facility-administered medications on file prior to visit.    Allergies  Allergen Reactions  . Sulfa Antibiotics     Unknown    Family History  Problem Relation Age of Onset  . Thyroid disease Neg Hx  BP 128/82   Pulse 62   Ht 5\' 10"  (1.778 m)   Wt 176 lb (79.8 kg)   SpO2 94%   BMI 25.25 kg/m     Review of Systems     Objective:   Physical Exam VITAL SIGNS:  See vs page GENERAL: no distress EXT: no leg edema.     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  Blood tests are requested for you today.  We'll let you know about the results.  If necessary, you should resume the demeclocycline.   Please come back for a follow-up appointment in 2 weeks.

## 2020-07-19 NOTE — Patient Instructions (Addendum)
Let's plan to recheck the ultrasound next year.  Blood tests are requested for you today.  We'll let you know about the results.  If necessary, you should resume the demeclocycline.   Please come back for a follow-up appointment in 2 weeks.

## 2020-07-28 DIAGNOSIS — R262 Difficulty in walking, not elsewhere classified: Secondary | ICD-10-CM | POA: Diagnosis not present

## 2020-07-28 DIAGNOSIS — M6281 Muscle weakness (generalized): Secondary | ICD-10-CM | POA: Diagnosis not present

## 2020-07-29 ENCOUNTER — Other Ambulatory Visit: Payer: Self-pay

## 2020-08-01 DIAGNOSIS — Z7409 Other reduced mobility: Secondary | ICD-10-CM | POA: Diagnosis not present

## 2020-08-01 DIAGNOSIS — M6281 Muscle weakness (generalized): Secondary | ICD-10-CM | POA: Diagnosis not present

## 2020-08-01 DIAGNOSIS — R262 Difficulty in walking, not elsewhere classified: Secondary | ICD-10-CM | POA: Diagnosis not present

## 2020-08-02 ENCOUNTER — Other Ambulatory Visit: Payer: Self-pay

## 2020-08-02 ENCOUNTER — Ambulatory Visit (INDEPENDENT_AMBULATORY_CARE_PROVIDER_SITE_OTHER): Payer: Medicare Other | Admitting: Endocrinology

## 2020-08-02 VITALS — BP 140/80 | HR 93 | Ht 70.0 in | Wt 176.2 lb

## 2020-08-02 DIAGNOSIS — E038 Other specified hypothyroidism: Secondary | ICD-10-CM | POA: Diagnosis not present

## 2020-08-02 DIAGNOSIS — I639 Cerebral infarction, unspecified: Secondary | ICD-10-CM | POA: Diagnosis not present

## 2020-08-02 DIAGNOSIS — E871 Hypo-osmolality and hyponatremia: Secondary | ICD-10-CM

## 2020-08-02 LAB — BASIC METABOLIC PANEL WITH GFR
BUN: 32 mg/dL — ABNORMAL HIGH (ref 6–23)
CO2: 30 meq/L (ref 19–32)
Calcium: 9.4 mg/dL (ref 8.4–10.5)
Chloride: 101 meq/L (ref 96–112)
Creatinine, Ser: 1.11 mg/dL (ref 0.40–1.50)
GFR: 65.36 mL/min
Glucose, Bld: 89 mg/dL (ref 70–99)
Potassium: 4 meq/L (ref 3.5–5.1)
Sodium: 139 meq/L (ref 135–145)

## 2020-08-02 LAB — TSH: TSH: 0.25 u[IU]/mL — ABNORMAL LOW (ref 0.35–4.50)

## 2020-08-02 LAB — T4, FREE: Free T4: 1.09 ng/dL (ref 0.60–1.60)

## 2020-08-02 NOTE — Progress Notes (Signed)
Subjective:    Patient ID: Jermaine Soto, male    DOB: 09/02/1945, 75 y.o.   MRN: [id number]  HPI Pt has MNG (dx'ed 2021; [place] in 2022 advises f/u in 1 year) Hypothyroidism: he has been on synthroid since 2016. Pt also has hyponatremia (dx'ed 2014; pt denies ACEI, duiretic, adrenal insuff, SIADH, cirrhosis, CHF, nephrotic synd, diarrhea, n/v, brain injury, pseudo, psychogenic polydipsia, pulm dz, chemotx, narcotics, SSRI, or GBS; he is on 1500 cc fluid restr; he stopped declomycin, due to cost).   He also has pituitary cyst.  He has these pituitary functions: ACTH needs and takes prednisone.   Prolactin normal FSH/LH: low (he has h/o prostate cancer, so not a candidate for rx)  TSH needs and takes synthroid. VP: low and sodium is low.   Since on prednisone and increased synthroid, pt states he feels well in general.  Past Medical History:  Diagnosis Date  . Arthritis    hands   . Hypothyroidism     Past Surgical History:  Procedure Laterality Date  . IR ANGIO INTRA EXTRACRAN SEL COM CAROTID INNOMINATE BILAT MOD SED  06/15/2020  . IR ANGIO VERTEBRAL SEL VERTEBRAL BILAT MOD SED  06/15/2020  . RADIOLOGY WITH ANESTHESIA N/A 06/15/2020   Procedure: IR WITH ANESTHESIA;  Surgeon: Radiologist, Medication, MD;  Location: [facility];  Service: Radiology;  Laterality: N/A;  . ROBOT ASSISTED LAPAROSCOPIC RADICAL PROSTATECTOMY N/A 12/24/2012   Procedure: ROBOTIC ASSISTED LAPAROSCOPIC RADICAL PROSTATECTOMY;  Surgeon: Bernestine Amass, MD;  Location: [facility];  Service: Urology;  Laterality: N/A;    Social History   Socioeconomic History  . Marital status: Married    Spouse name: Not on file  . Number of children: Not on file  . Years of education: Not on file  . Highest education level: Not on file  Occupational History  . Not on file  Tobacco Use  . Smoking status: Never Smoker  . Smokeless tobacco: Never Used  Substance and Sexual Activity  . Alcohol use: Yes    Comment: rare  . Drug use: No  .  Sexual activity: Not on file  Other Topics Concern  . Not on file  Social History Narrative  . Not on file   Social Determinants of Health   Financial Resource Strain: Not on file  Food Insecurity: Not on file  Transportation Needs: Not on file  Physical Activity: Not on file  Stress: Not on file  Social Connections: Not on file  Intimate Partner Violence: Not on file    Current Outpatient Medications on File Prior to Visit  Medication Sig Dispense Refill  . acetaminophen (TYLENOL) 325 MG tablet Take 325 mg by mouth every 6 (six) hours as needed for mild pain, fever or headache.    Marland Kitchen apixaban (ELIQUIS) 5 MG TABS tablet Take 1 tablet (5 mg total) by mouth 2 (two) times daily. 180 tablet 1  . Cholecalciferol (VITAMIN D3) 125 MCG (5000 UT) CAPS Take 5,000 Units by mouth daily.    . diphenhydrAMINE HCl (ZZZQUIL) 50 MG/30ML LIQD Take 30 mLs by mouth as needed (sleep).    Marland Kitchen levothyroxine (SYNTHROID) 100 MCG tablet Take 1 tablet (100 mcg total) by mouth daily. 90 tablet 3  . predniSONE (DELTASONE) 5 MG tablet Take 1 tablet (5 mg total) by mouth daily with breakfast. 90 tablet 3  . rosuvastatin (CRESTOR) 10 MG tablet Take 1 tablet (10 mg total) by mouth daily. 90 tablet 3   No current facility-administered medications on file  prior to visit.    Allergies  Allergen Reactions  . Sulfa Antibiotics     Unknown    Family History  Problem Relation Age of Onset  . Thyroid disease Neg Hx     BP 140/80 (BP Location: Right Arm, Patient Position: Sitting, Cuff Size: Normal)   Pulse 93   Ht 5\' 10"  (1.778 m)   Wt 176 lb 3.2 oz (79.9 kg)   SpO2 99%   BMI 25.28 kg/m   Review of Systems Denies n/v/HA    Objective:   Physical Exam VITAL SIGNS:  See vs page GENERAL: no distress EXT: no leg edema.   Lab Results  Component Value Date   CREATININE 1.11 08/02/2020   BUN 32 (H) 08/02/2020   NA 139 08/02/2020   K 4.0 08/02/2020   CL 101 08/02/2020   CO2 30 08/02/2020   Lab Results   Component Value Date   TSH 0.25 (L) 08/02/2020       Assessment & Plan:  Central hypothyroidism: well-replaced Hyponatremia: well-controlled. We'll follow.  Patient Instructions  Let's plan to recheck the ultrasound next year.  Blood tests are requested for you today.  We'll let you know about the results.  If necessary, you should resume the demeclocycline.   Please come back for a follow-up appointment in 3 months.

## 2020-08-02 NOTE — Patient Instructions (Addendum)
Let's plan to recheck the ultrasound next year.  Blood tests are requested for you today.  We'll let you know about the results.  If necessary, you should resume the demeclocycline.   Please come back for a follow-up appointment in 3 months.

## 2020-08-03 ENCOUNTER — Encounter: Payer: Self-pay | Admitting: Endocrinology

## 2020-08-03 DIAGNOSIS — M6281 Muscle weakness (generalized): Secondary | ICD-10-CM | POA: Diagnosis not present

## 2020-08-03 DIAGNOSIS — R262 Difficulty in walking, not elsewhere classified: Secondary | ICD-10-CM | POA: Diagnosis not present

## 2020-08-03 DIAGNOSIS — Z7409 Other reduced mobility: Secondary | ICD-10-CM | POA: Diagnosis not present

## 2020-08-09 DIAGNOSIS — M6281 Muscle weakness (generalized): Secondary | ICD-10-CM | POA: Diagnosis not present

## 2020-08-09 DIAGNOSIS — R262 Difficulty in walking, not elsewhere classified: Secondary | ICD-10-CM | POA: Diagnosis not present

## 2020-08-11 DIAGNOSIS — R262 Difficulty in walking, not elsewhere classified: Secondary | ICD-10-CM | POA: Diagnosis not present

## 2020-08-11 DIAGNOSIS — M6281 Muscle weakness (generalized): Secondary | ICD-10-CM | POA: Diagnosis not present

## 2020-08-11 DIAGNOSIS — Z7409 Other reduced mobility: Secondary | ICD-10-CM | POA: Diagnosis not present

## 2020-08-16 DIAGNOSIS — Z7409 Other reduced mobility: Secondary | ICD-10-CM | POA: Diagnosis not present

## 2020-08-16 DIAGNOSIS — M6281 Muscle weakness (generalized): Secondary | ICD-10-CM | POA: Diagnosis not present

## 2020-08-16 DIAGNOSIS — R262 Difficulty in walking, not elsewhere classified: Secondary | ICD-10-CM | POA: Diagnosis not present

## 2020-08-18 DIAGNOSIS — M6281 Muscle weakness (generalized): Secondary | ICD-10-CM | POA: Diagnosis not present

## 2020-08-18 DIAGNOSIS — R262 Difficulty in walking, not elsewhere classified: Secondary | ICD-10-CM | POA: Diagnosis not present

## 2020-08-18 DIAGNOSIS — Z7409 Other reduced mobility: Secondary | ICD-10-CM | POA: Diagnosis not present

## 2020-08-19 NOTE — Progress Notes (Signed)
Cardiology Office Note:    Date:  08/22/2020   ID:  Jermaine Soto, DOB Feb 02, 1946, MRN [id number]  PCP:  Jermaine Soto, [facility]  Cardiologist:  Jermaine Bergeron, MD  Advanced Practice Provider:  No care team member to display Electrophysiologist:  None    Referring MD: Jermaine Soto,*     History of Present Illness:    Jermaine Soto is a 75 y.o. male with a hx of hypothyroidism and Afib who presents to clinic for follow-up.  Patient initially seen on 04/06/20, after he was noted to be in Afib in the office with his PCP. He was started on apixaban for [facility]. He was also notably losing significant amount of weight in the setting of hyperthyroidism(TSH 0.088)from supratherapeutic doses of synthroid. We decreased his synthroid to 37.24mcg at that time. TTE showed LVEF 50-55%, no WMA, mild RAE, normal LA, no significant valvular disease.  Jermaine Soto He presented to [facility] on 06/01/20 with hyponatremia detected on out-patient lab work with Na 125. Thought may be due to poor po intake as glucose normal, TSH improved, no GI symptoms. He was given IVF with improvement. Repeat Na 128.  He then re-presentd to [facility] on 06/15/20 with left gaze preference and right sided neglect and global aphasia. MRI was negative. He was found to be profoundly hyponatremic and other work-up including MRI brain, CT angiogram and EEG negative. Urine studies consistent with SIADH. Put on 2L restriction with improvement of symptoms. Discharged home on 06/20/20.  Last seen in clinic on 06/23/20, the patient felt fatigued from recent hospitalization but was overall improving. He remained in NSR and no medication changes made at that time.  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   Patient states that he feels so much better. His energy is significantly improved. He is active and no longer needs a daytime nap. No  palpitations, SOB, chest pain, orthopnea or LE edema. Blood pressure is well controlled in 120s at home. HR 70s. Not currently on metoprolol. No bleeding issues on apixaban.  Past Medical History:  Diagnosis Date  . Arthritis    hands   . Hypothyroidism     Past Surgical History:  Procedure Laterality Date  . IR ANGIO INTRA EXTRACRAN SEL COM CAROTID INNOMINATE BILAT MOD SED  06/15/2020  . IR ANGIO VERTEBRAL SEL VERTEBRAL BILAT MOD SED  06/15/2020  . RADIOLOGY WITH ANESTHESIA N/A 06/15/2020   Procedure: IR WITH ANESTHESIA;  Surgeon: Radiologist, Medication, MD;  Location: [facility];  Service: Radiology;  Laterality: N/A;  . ROBOT ASSISTED LAPAROSCOPIC RADICAL PROSTATECTOMY N/A 12/24/2012   Procedure: ROBOTIC ASSISTED LAPAROSCOPIC RADICAL PROSTATECTOMY;  Surgeon: Jermaine Amass, MD;  Location: [facility];  Service: Urology;  Laterality: N/A;    Current Medications: Current Meds  Medication Sig  . acetaminophen (TYLENOL) 325 MG tablet Take 325 mg by mouth every 6 (six) hours as needed for mild pain, fever or headache.  Jermaine Soto apixaban (ELIQUIS) 5 MG TABS tablet Take 1 tablet (5 mg total) by mouth 2 (two) times daily.  . Cholecalciferol (VITAMIN D3) 125 MCG (5000 UT) CAPS Take 5,000 Units by mouth daily.  . diphenhydrAMINE HCl (ZZZQUIL) 50 MG/30ML LIQD Take 30 mLs by mouth as needed (sleep).  Jermaine Soto levothyroxine (SYNTHROID) 100 MCG tablet Take 1 tablet (100 mcg total) by mouth daily.  . metoprolol tartrate (LOPRESSOR) 25 MG tablet Take 25 mg by mouth daily.  . predniSONE (DELTASONE)  5 MG tablet Take 1 tablet (5 mg total) by mouth daily with breakfast.  . rosuvastatin (CRESTOR) 20 MG tablet Take 1 tablet (20 mg total) by mouth daily.  . [DISCONTINUED] rosuvastatin (CRESTOR) 10 MG tablet Take 1 tablet (10 mg total) by mouth daily.     Allergies:   Sulfa antibiotics   Social History   Socioeconomic History  . Marital status: Married    Spouse name: Not on file  . Number of children: Not on file  . Years of  education: Not on file  . Highest education level: Not on file  Occupational History  . Not on file  Tobacco Use  . Smoking status: Never Smoker  . Smokeless tobacco: Never Used  Substance and Sexual Activity  . Alcohol use: Yes    Comment: rare  . Drug use: No  . Sexual activity: Not on file  Other Topics Concern  . Not on file  Social History Narrative  . Not on file   Social Determinants of Health   Financial Resource Strain: Not on file  Food Insecurity: Not on file  Transportation Needs: Not on file  Physical Activity: Not on file  Stress: Not on file  Social Connections: Not on file     Family History: The patient's family history is negative for Thyroid disease.  ROS:   Please see the history of present illness.    Review of Systems  Constitutional: Negative for chills, fever and malaise/fatigue.  HENT: Negative for hearing loss.   Eyes: Negative for blurred vision and redness.  Respiratory: Negative for shortness of breath.   Cardiovascular: Negative for chest pain, palpitations, orthopnea, claudication, leg swelling and PND.  Gastrointestinal: Negative for blood in stool, melena, nausea and vomiting.  Genitourinary: Negative for dysuria and flank pain.  Musculoskeletal: Negative for falls.  Neurological: Negative for dizziness and loss of consciousness.  Endo/Heme/Allergies: Negative for polydipsia.  Psychiatric/Behavioral: Negative for substance abuse.    EKGs/Labs/Other Studies Reviewed:    The following studies were reviewed today: TTE 2020-05-09: IMPRESSIONS  1. Left ventricular ejection fraction, by estimation, is 50 to 55%. The  left ventricle has low normal function. The left ventricle has no regional  wall motion abnormalities. Left ventricular diastolic function could not  be evaluated.  2. Right ventricular systolic function is normal. The right ventricular  size is normal. There is normal pulmonary artery systolic pressure.  3. Right  atrial size was mildly dilated.  4. The mitral valve is normal in structure. Mild mitral valve  regurgitation. No evidence of mitral stenosis.  5. The aortic valve is normal in structure. Aortic valve regurgitation is  not visualized. No aortic stenosis is present.   CT Chest 06/17/19: FINDINGS: CT CHEST FINDINGS  Cardiovascular: UPPER limits normal heart size noted. Coronary artery atherosclerotic calcification involving the LEFT main and LAD noted. Mild thoracic aortic atherosclerotic calcifications noted without aneurysm. No pericardial effusion.  Mediastinum/Nodes: No enlarged mediastinal, hilar, or axillary lymph nodes. Thyroid gland, trachea, and esophagus demonstrate no significant findings.  Lungs/Pleura: Mild to moderate atelectasis in both lung bases and in the SUPERIOR segment LEFT LOWER lobe noted.  There is no evidence of airspace disease, consolidation, mass, suspicious nodule, pleural effusion or pneumothorax.  Musculoskeletal: No acute or suspicious bony abnormalities are noted.  CT ABDOMEN PELVIS FINDINGS  Hepatobiliary: The liver and gallbladder are unremarkable. No biliary dilatation.  Pancreas: Unremarkable  Spleen: Unremarkable  Adrenals/Urinary Tract: The kidneys, adrenal glands and bladder are unremarkable.  Stomach/Bowel: Stomach  is within normal limits. Appendix appears normal. No evidence of bowel wall thickening, distention, or inflammatory changes.  Vascular/Lymphatic: Aortic atherosclerosis. No enlarged abdominal or pelvic lymph nodes.  Reproductive: Prostatectomy.  Other: No ascites, focal collection or pneumoperitoneum.  Musculoskeletal: No acute or suspicious bony abnormalities. 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  IMPRESSION: 1. No evidence of primary malignancy or metastatic disease. 2. Mild to moderate bibasilar and SUPERIOR segment LEFT LOWER  lobe atelectasis. 3. Coronary artery disease. 4. Aortic Atherosclerosis (ICD10-I70.0).  MRI head 06/16/20: FINDINGS: Brain: Examination mildly degraded by motion artifact.  Cerebral volume within normal limits. Mild chronic microvascular ischemic disease noted involving the periventricular white matter. No abnormal foci of restricted diffusion to suggest acute or subacute ischemia. Gray-white matter differentiation maintained. No encephalomalacia to suggest chronic cortical infarction. No evidence for acute or chronic intracranial hemorrhage.  1.4 x 1.1 x 1.2 cm well-circumscribed cystic lesion seen within the pituitary gland, indeterminate, but 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 Suprasellar extension without significant mass effect on the optic chiasm.  No other mass lesion, midline shift or mass effect. No hydrocephalus or extra-axial fluid collection.  Vascular: Major intracranial vascular flow voids are well maintained.  Skull and upper cervical spine: Craniocervical junction within normal limits. Bone marrow signal intensity normal. No scalp soft tissue abnormality.  Sinuses/Orbits: Patient status post bilateral ocular lens replacement. Globes and orbital soft tissues within normal limits. Fluid seen within the nasopharynx. Patient is intubated. Trace bilateral mastoid effusions noted.  Other: None.  IMPRESSION: 1. No acute intracranial abnormality. 2. Mild chronic microvascular ischemic disease for age. 3. 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   Recent Labs: 06/18/2020: Magnesium 2.0 06/28/2020: ALT 12; Hemoglobin 12.6; Platelets 255.0 08/02/2020: BUN 32; Creatinine, Ser 1.11; Potassium 4.0; Sodium 139; TSH 0.25  Recent Lipid Panel    Component Value Date/Time   CHOL 188 06/16/2020 0344   TRIG 107 06/16/2020 0344   TRIG 108  06/16/2020 0344   HDL 54 06/16/2020 0344   CHOLHDL 3.5 06/16/2020 0344   VLDL 21 06/16/2020 0344   LDLCALC 113 (H) 06/16/2020 0344     Risk Assessment/Calculations:    CHA2DS2-VASc Score = 2  This indicates a 2.2% annual risk of stroke. The patient's score is based upon: CHF History: No HTN History: No Diabetes History: No Stroke History: No Vascular Disease History: Yes Age Score: 1 Gender Score: 0    Physical Exam:    VS:  BP 130/80   Pulse 74   Ht 5\' 10"  (1.778 m)   Wt 183 lb (83 kg)   SpO2 99%   BMI 26.26 kg/m     Wt Readings from Last 3 Encounters:  08/22/20 183 lb (83 kg)  08/02/20 176 lb 3.2 oz (79.9 kg)  07/19/20 176 lb (79.8 kg)     GEN:  Well nourished, well developed in no acute distress HEENT: Normal NECK: No JVD; No carotid bruits CARDIAC: RRR, no murmurs, rubs, gallops RESPIRATORY:  Clear to auscultation without rales, wheezing or rhonchi  ABDOMEN: Soft, non-tender, non-distended MUSCULOSKELETAL:  No edema; No deformity  SKIN: Warm and dry NEUROLOGIC:  Alert and oriented x 3 PSYCHIATRIC:  Normal affect   ASSESSMENT:    1. Atrial fibrillation, unspecified type ([place])   2. Hyperlipidemia, unspecified hyperlipidemia type   3. Hyponatremia   4. Thyroid disease   5. Weight loss  PLAN:    In order of problems listed above:  #Atrial fibrillation: CHADs-vasc 2 for age and coronary/aortic atherosclerosis noted on CT chest. Developed in the setting of elevated thyroid levels.Wanted to proceed with anticoagulation to reduce stroke risk as patient is pre-diabetic and has a positive family history for stroke in the family. Tolerating apixaban well with no bleeding. Not on BB.Back in NSR. -Continueeliquis 5mg  BID for anticoagulation -Not on BB as was hypotensive in the setting of Endocrinopathy; BP and HR well controlled. Will continue to monitor and add back if needed -TTE with LVEF 50-55%, no regional WMA  #Pituitary Insufficiency: #Central  hypothyroidism #SIADH #Weight loss: Patient initially presented with unintentional wight loss of 40lbs with labs consistent with iatrogenic TSH. Also with hyponatremia and significant daytime somnolence with underlying Endocrinopathy. Was referred to Dr. Garnette Scheuermann and was diagnosed with pituitary insufficiency. Now on prednisone, synthroid, and fluid restriction with remarkable improvement. Energy levels significantly improved. Gained weight. Na well controlled. No recurrent Afib. -Continue follow-up with Endocrine as scheduled -Continue synthroid, prednisone and fluid restriction  #Coronary Calcification: Noted on CT chest. Asymptomatic with no exertional chest pain, SOB or faitgue. LDL 113. -Increase crestor to 20mg  daily -Check lipid panel in 6 weeks  #Hyponatremia #Recent hospitalization with AMS, stroke-like symptoms: Resolved. Patient admitted in early Jan 2022 with left gaze deviation and right sided neglect as well as AMS. MRI head without stroke. CTA without significant vascular disease. Did not received tPA as on systemic AC. EEG without seizures. Thought symptoms driven by hyponatremia with Na 117. Urine studies consistent with SIADH. Currently feeling better with no recurrence of symptoms. On 1.5L fluid restriction. -Follow-up with Endocrinology as scheduled  #Weight loss: Significantly improved. Patient with profound weight loss thought to be driven by iatrogenic hyperthyroidism and pituitary insufficiency. CT chest/abd/pelvis without evidence of mass. He is s/p prostatectomy and last colonoscopy 1 year ago was okay. Now significantly improved with treatment of pituitary insufficiency. -Follow-up with endocrine as above -Fortunately, no evidence of malignancy on CT c/a/p   Medication Adjustments/Labs and Tests Ordered: Current medicines are reviewed at length with the patient today.  Concerns regarding medicines are outlined above.  Orders Placed This Encounter  Procedures   . Lipid panel   Meds ordered this encounter  Medications  . rosuvastatin (CRESTOR) 20 MG tablet    Sig: Take 1 tablet (20 mg total) by mouth daily.    Dispense:  90 tablet    Refill:  3    Patient Instructions  Medication Instructions:  Your physician has recommended you make the following change in your medication:  Start Crestor 20 mg by mouth daily *If you need a refill on your cardiac medications before your next appointment, please call your pharmacy*   Lab Work: Lipids in  6 weeks If you have labs (blood work) drawn today and your tests are completely normal, you will receive your results only by: Jermaine Soto MyChart Message (if you have MyChart) OR . A paper copy in the mail If you have any lab test that is abnormal or we need to change your treatment, we will call you to review the results.  You will follow up with Dr. Gwyndolyn Kaufman in July  Provider:   You may see Jermaine Bergeron, MD or one of the following Advanced Practice Providers on your designated Care Team:    Richardson Dopp, PA-C  Vin Bhagat, [place]  You will follow up in the [facility] located at [place]  provider will be:        Signed, Jermaine Bergeron, MD  08/22/2020 8:43 AM    [place]

## 2020-08-22 ENCOUNTER — Encounter: Payer: Self-pay | Admitting: Cardiology

## 2020-08-22 ENCOUNTER — Other Ambulatory Visit: Payer: Self-pay

## 2020-08-22 ENCOUNTER — Ambulatory Visit (INDEPENDENT_AMBULATORY_CARE_PROVIDER_SITE_OTHER): Payer: Medicare Other | Admitting: Cardiology

## 2020-08-22 VITALS — BP 130/80 | HR 74 | Ht 70.0 in | Wt 183.0 lb

## 2020-08-22 DIAGNOSIS — E079 Disorder of thyroid, unspecified: Secondary | ICD-10-CM

## 2020-08-22 DIAGNOSIS — R634 Abnormal weight loss: Secondary | ICD-10-CM | POA: Diagnosis not present

## 2020-08-22 DIAGNOSIS — E785 Hyperlipidemia, unspecified: Secondary | ICD-10-CM

## 2020-08-22 DIAGNOSIS — I4891 Unspecified atrial fibrillation: Secondary | ICD-10-CM

## 2020-08-22 DIAGNOSIS — E871 Hypo-osmolality and hyponatremia: Secondary | ICD-10-CM | POA: Diagnosis not present

## 2020-08-22 MED ORDER — ROSUVASTATIN CALCIUM 20 MG PO TABS: 20.0000 mg | ORAL_TABLET | Freq: Every day | ORAL | 3 refills | Status: DC

## 2020-08-22 NOTE — Patient Instructions (Signed)
Medication Instructions:  Your physician has recommended you make the following change in your medication:  Start Crestor 20 mg by mouth daily *If you need a refill on your cardiac medications before your next appointment, please call your pharmacy*   Lab Work: Lipids in  6 weeks If you have labs (blood work) drawn today and your tests are completely normal, you will receive your results only by: Marland Kitchen MyChart Message (if you have MyChart) OR . A paper copy in the mail If you have any lab test that is abnormal or we need to change your treatment, we will call you to review the results.  You will follow up with Dr. Gwyndolyn Kaufman in July  Provider:   You may see Freada Bergeron, MD or one of the following Advanced Practice Providers on your designated Care Team:    Richardson Dopp, PA-C  Vin Bhagat, [place]  You will follow up in the [facility] located at [facility]. Your provider will be:

## 2020-08-23 DIAGNOSIS — R262 Difficulty in walking, not elsewhere classified: Secondary | ICD-10-CM | POA: Diagnosis not present

## 2020-08-23 DIAGNOSIS — Z7409 Other reduced mobility: Secondary | ICD-10-CM | POA: Diagnosis not present

## 2020-08-23 DIAGNOSIS — M6281 Muscle weakness (generalized): Secondary | ICD-10-CM | POA: Diagnosis not present

## 2020-08-25 DIAGNOSIS — R262 Difficulty in walking, not elsewhere classified: Secondary | ICD-10-CM | POA: Diagnosis not present

## 2020-08-25 DIAGNOSIS — Z7409 Other reduced mobility: Secondary | ICD-10-CM | POA: Diagnosis not present

## 2020-08-25 DIAGNOSIS — M6281 Muscle weakness (generalized): Secondary | ICD-10-CM | POA: Diagnosis not present

## 2020-09-01 DIAGNOSIS — M6281 Muscle weakness (generalized): Secondary | ICD-10-CM | POA: Diagnosis not present

## 2020-09-01 DIAGNOSIS — R262 Difficulty in walking, not elsewhere classified: Secondary | ICD-10-CM | POA: Diagnosis not present

## 2020-09-01 DIAGNOSIS — Z7409 Other reduced mobility: Secondary | ICD-10-CM | POA: Diagnosis not present

## 2020-10-03 ENCOUNTER — Other Ambulatory Visit: Payer: Self-pay

## 2020-10-03 ENCOUNTER — Other Ambulatory Visit: Payer: Medicare Other | Admitting: *Deleted

## 2020-10-03 ENCOUNTER — Encounter: Payer: Self-pay | Admitting: Internal Medicine

## 2020-10-03 DIAGNOSIS — E785 Hyperlipidemia, unspecified: Secondary | ICD-10-CM

## 2020-10-03 LAB — LIPID PANEL
Chol/HDL Ratio: 1.8 ratio (ref 0.0–5.0)
Cholesterol, Total: 163 mg/dL (ref 100–199)
HDL: 92 mg/dL (ref >39)
LDL Chol Calc (NIH): 55 mg/dL (ref 0–99)
Triglycerides: 86 mg/dL (ref 0–149)
VLDL Cholesterol Cal: 16 mg/dL (ref 5–40)

## 2020-10-04 ENCOUNTER — Other Ambulatory Visit: Payer: Self-pay | Admitting: *Deleted

## 2020-11-01 ENCOUNTER — Ambulatory Visit (INDEPENDENT_AMBULATORY_CARE_PROVIDER_SITE_OTHER): Payer: Medicare Other | Admitting: Endocrinology

## 2020-11-01 ENCOUNTER — Other Ambulatory Visit: Payer: Self-pay

## 2020-11-01 VITALS — BP 136/78 | HR 81 | Ht 70.0 in | Wt 206.4 lb

## 2020-11-01 DIAGNOSIS — I639 Cerebral infarction, unspecified: Secondary | ICD-10-CM | POA: Diagnosis not present

## 2020-11-01 DIAGNOSIS — E871 Hypo-osmolality and hyponatremia: Secondary | ICD-10-CM | POA: Diagnosis not present

## 2020-11-01 DIAGNOSIS — E038 Other specified hypothyroidism: Secondary | ICD-10-CM

## 2020-11-01 LAB — TSH: TSH: 0.05 u[IU]/mL — ABNORMAL LOW (ref 0.35–4.50)

## 2020-11-01 LAB — BASIC METABOLIC PANEL
BUN: 27 mg/dL — ABNORMAL HIGH (ref 6–23)
CO2: 29 mEq/L (ref 19–32)
Calcium: 9.5 mg/dL (ref 8.4–10.5)
Chloride: 103 mEq/L (ref 96–112)
Creatinine, Ser: 1.05 mg/dL (ref 0.40–1.50)
GFR: 69.75 mL/min (ref >60.00)
Glucose, Bld: 98 mg/dL (ref 70–99)
Potassium: 4.2 mEq/L (ref 3.5–5.1)
Sodium: 141 mEq/L (ref 135–145)

## 2020-11-01 LAB — T4, FREE: Free T4: 1.13 ng/dL (ref 0.60–1.60)

## 2020-11-01 NOTE — Progress Notes (Signed)
Subjective:    Patient ID: Jermaine Soto, male    DOB: 23-Feb-1946, 75 y.o.   MRN: [id number]  HPI Pt has MNG (dx'ed 2021; [place] in 2022 advised f/u in 1 year) Hypothyroidism: he has been on synthroid since 2016.  Pt also has hyponatremia (dx'ed 2014; pt denies ACEI, duiretic, adrenal insuff, SIADH, cirrhosis, CHF, nephrotic synd, diarrhea, n/v, brain injury, pseudo, psychogenic polydipsia, pulm dz, chemotx, narcotics, SSRI, or GBS; he is on 1500 cc fluid restr; he stopped declomycin, due to cost).   He also has pituitary cyst (dx'ed 2022) He has these pituitary functions:  ACTH needs and takes prednisone.   Prolactin normal FSH/LH: low (he has h/o prostate cancer, so not a candidate for rx).   TSH needs and takes synthroid.  (hypothyroidism is now prob a combination of primary and secondary).   VP: low and sodium is intermittently low.  pt states he feels well in general. He takes meds as rx'ed.   Past Medical History:  Diagnosis Date  . Arthritis    hands   . Hypothyroidism     Past Surgical History:  Procedure Laterality Date  . IR ANGIO INTRA EXTRACRAN SEL COM CAROTID INNOMINATE BILAT MOD SED  06/15/2020  . IR ANGIO VERTEBRAL SEL VERTEBRAL BILAT MOD SED  06/15/2020  . RADIOLOGY WITH ANESTHESIA N/A 06/15/2020   Procedure: IR WITH ANESTHESIA;  Surgeon: Radiologist, Medication, MD;  Location: [facility];  Service: Radiology;  Laterality: N/A;  . ROBOT ASSISTED LAPAROSCOPIC RADICAL PROSTATECTOMY N/A 12/24/2012   Procedure: ROBOTIC ASSISTED LAPAROSCOPIC RADICAL PROSTATECTOMY;  Surgeon: Bernestine Amass, MD;  Location: [facility];  Service: Urology;  Laterality: N/A;    Social History   Socioeconomic History  . Marital status: Married    Spouse name: Not on file  . Number of children: Not on file  . Years of education: Not on file  . Highest education level: Not on file  Occupational History  . Not on file  Tobacco Use  . Smoking status: Never Smoker  . Smokeless tobacco: Never Used  Substance  and Sexual Activity  . Alcohol use: Yes    Comment: rare  . Drug use: No  . Sexual activity: Not on file  Other Topics Concern  . Not on file  Social History Narrative  . Not on file   Social Determinants of Health   Financial Resource Strain: Not on file  Food Insecurity: Not on file  Transportation Needs: Not on file  Physical Activity: Not on file  Stress: Not on file  Social Connections: Not on file  Intimate Partner Violence: Not on file    Current Outpatient Medications on File Prior to Visit  Medication Sig Dispense Refill  . acetaminophen (TYLENOL) 325 MG tablet Take 325 mg by mouth every 6 (six) hours as needed for mild pain, fever or headache.    Marland Kitchen apixaban (ELIQUIS) 5 MG TABS tablet Take 1 tablet (5 mg total) by mouth 2 (two) times daily. 180 tablet 1  . Cholecalciferol (VITAMIN D3) 125 MCG (5000 UT) CAPS Take 5,000 Units by mouth daily.    . diphenhydrAMINE HCl (ZZZQUIL) 50 MG/30ML LIQD Take 30 mLs by mouth as needed (sleep).    Marland Kitchen levothyroxine (SYNTHROID) 100 MCG tablet Take 1 tablet (100 mcg total) by mouth daily. 90 tablet 3  . metoprolol tartrate (LOPRESSOR) 25 MG tablet Take 25 mg by mouth daily.    . predniSONE (DELTASONE) 5 MG tablet Take 1 tablet (5 mg total) by mouth  daily with breakfast. 90 tablet 3  . rosuvastatin (CRESTOR) 20 MG tablet Take 1 tablet (20 mg total) by mouth daily. 90 tablet 3   No current facility-administered medications on file prior to visit.    Allergies  Allergen Reactions  . Sulfa Antibiotics     Unknown    Family History  Problem Relation Age of Onset  . Thyroid disease Neg Hx     BP 136/78 (BP Location: Right Arm, Patient Position: Sitting, Cuff Size: Normal)   Pulse 81   Ht 5\' 10"  (1.778 m)   Wt 206 lb 6.4 oz (93.6 kg)   SpO2 98%   BMI 29.62 kg/m    Review of Systems Denies headache.      Objective:   Physical Exam VITAL SIGNS:  See vs page GENERAL: no distress Ext: no leg edema  Lab Results  Component  Value Date   TSH 0.05 (L) 11/01/2020   Lab Results  Component Value Date   CREATININE 1.05 11/01/2020   BUN 27 (H) 11/01/2020   NA 141 11/01/2020   K 4.2 11/01/2020   CL 103 11/01/2020   CO2 29 11/01/2020         Assessment & Plan:  Hyponatremia: stable off rx.  We'll follow Hypothyroidism, primary + secondary.  well-controlled. Please continue the same synthroid HPA insuff: well-controlled. Please continue the same prednisone

## 2020-11-01 NOTE — Patient Instructions (Signed)
Let's plan to recheck the ultrasound next year.   Blood tests are requested for you today.  We'll let you know about the results.  If necessary, you should resume the demeclocycline.   We'll plan to recheck the MRI later this year.   Please come back for a follow-up appointment in 4 months.

## 2020-12-10 DIAGNOSIS — Z20822 Contact with and (suspected) exposure to covid-19: Secondary | ICD-10-CM | POA: Diagnosis not present

## 2020-12-15 NOTE — Progress Notes (Deleted)
Cardiology Office Note:    Date:  12/22/2020   ID:  Jermaine Soto, DOB Jun 19, 1945, MRN [id number]  PCP:  Jermaine Soto, [facility]  Cardiologist:  Jermaine Bergeron, MD  Advanced Practice Provider:  No care team member to display Electrophysiologist:  None    Referring MD: Jermaine Soto,*     History of Present Illness:    Jermaine Soto is a 75 y.o. male with a hx of hypothyroidism and Afib who presents to clinic for follow-up.  Patient initially seen on 04/06/20, after he was noted to be in Afib in the office with his PCP. He was started on apixaban for [facility]. He was also notably losing significant amount of weight in the setting of hyperthyroidism (TSH 0.088) from supratherapeutic doses of synthroid. We decreased his synthroid to 37.66mcg at that time. TTE showed LVEF 50-55%, no WMA, mild RAE, normal LA, no significant valvular disease.   Jermaine Soto He presented to [facility] on [facility]20 with hyponatremia detected on out-patient lab work with Na 125. Thought may be due to poor po intake as glucose normal, TSH improved, no GI symptoms. He was given IVF with improvement. Repeat Na 128.   He then re-presentd to [facility] on 06/15/20 with left gaze preference and right sided neglect and global aphasia. MRI was negative. He was found to be profoundly hyponatremic and other work-up including MRI brain, CT angiogram and EEG negative. Urine studies consistent with SIADH. Put on 2L restriction with improvement of symptoms. Discharged home on 06/20/20.   Last seen in clinic on 08/22/20 where he was doing significantly better.  Past Medical History:  Diagnosis Date   Arthritis    hands    Hypothyroidism     Past Surgical History:  Procedure Laterality Date   IR ANGIO INTRA EXTRACRAN SEL COM CAROTID INNOMINATE BILAT MOD SED  06/15/2020   IR ANGIO VERTEBRAL SEL VERTEBRAL BILAT MOD SED  06/15/2020   RADIOLOGY WITH ANESTHESIA N/A 06/15/2020   Procedure: IR WITH ANESTHESIA;   Surgeon: Radiologist, Medication, MD;  Location: [facility];  Service: Radiology;  Laterality: N/A;   ROBOT ASSISTED LAPAROSCOPIC RADICAL PROSTATECTOMY N/A 12/24/2012   Procedure: ROBOTIC ASSISTED LAPAROSCOPIC RADICAL PROSTATECTOMY;  Surgeon: Jermaine Amass, MD;  Location: [facility];  Service: Urology;  Laterality: N/A;    Current Medications: Current Meds  Medication Sig   acetaminophen (TYLENOL) 325 MG tablet Take 325 mg by mouth every 6 (six) hours as needed for mild pain, fever or headache.   apixaban (ELIQUIS) 5 MG TABS tablet Take 1 tablet (5 mg total) by mouth 2 (two) times daily.   Cholecalciferol (VITAMIN D3) 125 MCG (5000 UT) CAPS Take 5,000 Units by mouth daily.   levothyroxine (SYNTHROID) 100 MCG tablet Take 1 tablet (100 mcg total) by mouth daily.   predniSONE (DELTASONE) 5 MG tablet Take 1 tablet (5 mg total) by mouth daily with breakfast.   rosuvastatin (CRESTOR) 20 MG tablet Take 1 tablet (20 mg total) by mouth daily.     Allergies:   Sulfa antibiotics   Social History   Socioeconomic History   Marital status: Married    Spouse name: Not on file   Number of children: Not on file   Years of education: Not on file   Highest education level: Not on file  Occupational History   Not on file  Tobacco Use   Smoking status: Never   Smokeless tobacco: Never  Substance and Sexual Activity  Alcohol use: Yes    Comment: rare   Drug use: No   Sexual activity: Not on file  Other Topics Concern   Not on file  Social History Narrative   Not on file   Social Determinants of Health   Financial Resource Strain: Not on file  Food Insecurity: Not on file  Transportation Needs: Not on file  Physical Activity: Not on file  Stress: Not on file  Social Connections: Not on file     Family History: The patient's family history is negative for Thyroid disease.  ROS:   Please see the history of present illness.    Review of Systems  Constitutional:  Negative for chills, fever and  malaise/fatigue.  HENT:  Negative for hearing loss.   Eyes:  Negative for blurred vision and redness.  Respiratory:  Negative for shortness of breath.   Cardiovascular:  Negative for chest pain, palpitations, orthopnea, claudication, leg swelling and PND.  Gastrointestinal:  Negative for blood in stool, melena, nausea and vomiting.  Genitourinary:  Negative for dysuria and flank pain.  Musculoskeletal:  Negative for falls.  Neurological:  Negative for dizziness and loss of consciousness.  Endo/Heme/Allergies:  Negative for polydipsia.  Psychiatric/Behavioral:  Negative for substance abuse.    EKGs/Labs/Other Studies Reviewed:    The following studies were reviewed today: TTE 2020-05-07: IMPRESSIONS   1. Left ventricular ejection fraction, by estimation, is 50 to 55%. The  left ventricle has low normal function. The left ventricle has no regional  wall motion abnormalities. Left ventricular diastolic function could not  be evaluated.   2. Right ventricular systolic function is normal. The right ventricular  size is normal. There is normal pulmonary artery systolic pressure.   3. Right atrial size was mildly dilated.   4. The mitral valve is normal in structure. Mild mitral valve  regurgitation. No evidence of mitral stenosis.   5. The aortic valve is normal in structure. Aortic valve regurgitation is  not visualized. No aortic stenosis is present.    CT Chest 06/17/19: FINDINGS: CT CHEST FINDINGS   Cardiovascular: UPPER limits normal heart size noted. Coronary artery atherosclerotic calcification involving the LEFT main and LAD noted. Mild thoracic aortic atherosclerotic calcifications noted without aneurysm. No pericardial effusion.   Mediastinum/Nodes: No enlarged mediastinal, hilar, or axillary lymph nodes. Thyroid gland, trachea, and esophagus demonstrate no significant findings.   Lungs/Pleura: Mild to moderate atelectasis in both lung bases and in the SUPERIOR segment  LEFT LOWER lobe noted.   There is no evidence of airspace disease, consolidation, mass, suspicious nodule, pleural effusion or pneumothorax.   Musculoskeletal: No acute or suspicious bony abnormalities are noted.   CT ABDOMEN PELVIS FINDINGS   Hepatobiliary: The liver and gallbladder are unremarkable. No biliary dilatation.   Pancreas: Unremarkable   Spleen: Unremarkable   Adrenals/Urinary Tract: The kidneys, adrenal glands and bladder are unremarkable.   Stomach/Bowel: Stomach is within normal limits. Appendix appears normal. No evidence of bowel wall thickening, distention, or inflammatory changes.   Vascular/Lymphatic: Aortic atherosclerosis. No enlarged abdominal or pelvic lymph nodes.   Reproductive: Prostatectomy.   Other: No ascites, focal collection or pneumoperitoneum.   Musculoskeletal: No acute or suspicious bony abnormalities. 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   IMPRESSION: 1. No evidence of primary malignancy or metastatic disease. 2. Mild to moderate bibasilar and SUPERIOR segment LEFT LOWER lobe atelectasis. 3. Coronary artery disease. 4. Aortic Atherosclerosis (ICD10-I70.0).   MRI head 06/16/20: FINDINGS: Brain: Examination mildly  degraded by motion artifact.   Cerebral volume within normal limits. Mild chronic microvascular ischemic disease noted involving the periventricular white matter. No abnormal foci of restricted diffusion to suggest acute or subacute ischemia. Gray-white matter differentiation maintained. No encephalomalacia to suggest chronic cortical infarction. No evidence for acute or chronic intracranial hemorrhage.   1.4 x 1.1 x 1.2 cm well-circumscribed cystic lesion seen within the pituitary gland, indeterminate, but 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 Suprasellar extension without significant mass effect on the optic chiasm.   No other mass  lesion, midline shift or mass effect. No hydrocephalus or extra-axial fluid collection.   Vascular: Major intracranial vascular flow voids are well maintained.   Skull and upper cervical spine: Craniocervical junction within normal limits. Bone marrow signal intensity normal. No scalp soft tissue abnormality.   Sinuses/Orbits: Patient status post bilateral ocular lens replacement. Globes and orbital soft tissues within normal limits. Fluid seen within the nasopharynx. Patient is intubated. Trace bilateral mastoid effusions noted.   Other: None.   IMPRESSION: 1. No acute intracranial abnormality. 2. Mild chronic microvascular ischemic disease for age. 3. 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   Recent Labs: 06/18/2020: Magnesium 2.0 06/28/2020: ALT 12; Hemoglobin 12.6; Platelets 255.0 11/01/2020: BUN 27; Creatinine, Ser 1.05; Potassium 4.2; Sodium 141; TSH 0.05  Recent Lipid Panel    Component Value Date/Time   CHOL 163 10/03/2020 1009   TRIG 86 10/03/2020 1009   HDL 92 10/03/2020 1009   CHOLHDL 1.8 10/03/2020 1009   CHOLHDL 3.5 06/16/2020 0344   VLDL 21 06/16/2020 0344   LDLCALC 55 10/03/2020 1009     Risk Assessment/Calculations:    CHA2DS2-VASc Score = 2  This indicates a 2.2% annual risk of stroke. The patient's score is based upon: CHF History: No HTN History: No Diabetes History: No Stroke History: No Vascular Disease History: Yes Age Score: 1 Gender Score: 0    Physical Exam:    VS:  BP 132/80   Pulse 68   Ht 5\' 10"  (1.778 m)   Wt 216 lb 9.6 oz (98.2 kg)   SpO2 98%   BMI 31.08 kg/m     Wt Readings from Last 3 Encounters:  12/22/20 216 lb 9.6 oz (98.2 kg)  11/01/20 206 lb 6.4 oz (93.6 kg)  08/22/20 183 lb (83 kg)     GEN:  Well nourished, well developed in no acute distress HEENT: Normal NECK: No JVD; No carotid bruits CARDIAC: RRR, no  murmurs, rubs, gallops RESPIRATORY:  Clear to auscultation without rales, wheezing or rhonchi  ABDOMEN: Soft, non-tender, non-distended MUSCULOSKELETAL:  No edema; No deformity  SKIN: Warm and dry NEUROLOGIC:  Alert and oriented x 3 PSYCHIATRIC:  Normal affect   ASSESSMENT:    No diagnosis found.  PLAN:    In order of problems listed above:  #Atrial fibrillation: CHADs-vasc 2 for age and coronary/aortic atherosclerosis noted on CT chest. Developed in the setting of elevated thyroid levels. Wanted to proceed with anticoagulation to reduce stroke risk as patient is pre-diabetic and has a positive family history for stroke in the family. Tolerating apixaban well with no bleeding. Not on BB. Back in NSR. -Continue eliquis 5mg  BID for anticoagulation -Not on BB as was hypotensive in the setting of Endocrinopathy; BP and HR well controlled. Will continue to monitor and add back if needed -TTE with LVEF 50-55%, no regional WMA   #Pituitary Insufficiency: #  Central hypothyroidism #SIADH #Weight loss: Patient initially presented with unintentional wight loss of 40lbs with labs consistent with iatrogenic TSH. Also with hyponatremia and significant daytime somnolence with underlying Endocrinopathy. Was referred to Dr. Garnette Scheuermann and was diagnosed with pituitary insufficiency. Now on prednisone, synthroid, and fluid restriction with remarkable improvement. Energy levels significantly improved. Gained weight. Na well controlled. No recurrent Afib. -Continue follow-up with Endocrine as scheduled -Continue synthroid, prednisone and fluid restriction   #Coronary Calcification: Noted on CT chest. Asymptomatic with no exertional chest pain, SOB or faitgue. LDL 55, HDL 92, TG 163 on 10/03/20. -Continue crestor 20mg  daily -LDL at goal  #Hyponatremia #Recent hospitalization with AMS, stroke-like symptoms: Resolved. Patient admitted in early Jan 2022 with left gaze deviation and right sided neglect as  well as AMS. MRI head without stroke. CTA without significant vascular disease. Did not received tPA as on systemic AC. EEG without seizures. Thought symptoms driven by hyponatremia with Na 117. Urine studies consistent with SIADH. Currently feeling better with no recurrence of symptoms. On 1.5L fluid restriction. -Follow-up with Endocrinology as scheduled   #Weight loss: Significantly improved. Patient with profound weight loss thought to be driven by iatrogenic hyperthyroidism and pituitary insufficiency. CT chest/abd/pelvis without evidence of mass. He is s/p prostatectomy and last colonoscopy 1 year ago was okay. Now significantly improved with treatment of pituitary insufficiency. -Follow-up with endocrine as above -Fortunately, no evidence of malignancy on CT c/a/p   Medication Adjustments/Labs and Tests Ordered: Current medicines are reviewed at length with the patient today.  Concerns regarding medicines are outlined above.  No orders of the defined types were placed in this encounter.  No orders of the defined types were placed in this encounter.   There are no Patient Instructions on file for this visit.    Signed, Jermaine Bergeron, MD  12/22/2020 8:45 AM    [place]

## 2020-12-19 ENCOUNTER — Other Ambulatory Visit: Payer: Self-pay | Admitting: *Deleted

## 2020-12-19 MED ORDER — APIXABAN 5 MG PO TABS: 5.0000 mg | ORAL_TABLET | Freq: Two times a day (BID) | ORAL | 2 refills | Status: DC

## 2020-12-19 NOTE — Telephone Encounter (Signed)
Eliquis 5mg  paper refill request received. Patient is 75 years old, weight-93.6kg, Crea-1.05 on 11/01/2020, Diagnosis-Afib, and last seen by Dr. Johney Frame on 08/22/20. Dose is appropriate based on dosing criteria. Will send in refill to requested pharmacy.

## 2020-12-21 NOTE — Progress Notes (Signed)
Cardiology Office Note:    Date:  12/22/2020   ID:  Jermaine Soto, DOB 1945-11-09, MRN [id number]  PCP:  Marrian Salvage, [facility]  Cardiologist:  Freada Bergeron, MD  Advanced Practice Provider:  No care team member to display Electrophysiologist:  None    Referring MD: Marrian Salvage,*     History of Present Illness:    Jermaine Soto is a 75 y.o. male with a hx of hypothyroidism and Afib who presents to clinic for follow-up.  Patient initially seen on 04/06/20, after he was noted to be in Afib in the office with his PCP. He was started on apixaban for [facility]. He was also notably losing significant amount of weight in the setting of hyperthyroidism (TSH 0.088) from supratherapeutic doses of synthroid. We decreased his synthroid to 37.53mcg at that time. TTE showed LVEF 50-55%, no WMA, mild RAE, normal LA, no significant valvular disease.   Marland Kitchen He presented to [facility] on 06/01/20 with hyponatremia detected on out-patient lab work with Na 125. Thought may be due to poor po intake as glucose normal, TSH improved, no GI symptoms. He was given IVF with improvement. Repeat Na 128.   He then re-presentd to [facility] on 06/15/20 with left gaze preference and right sided neglect and global aphasia. MRI was negative. He was found to be profoundly hyponatremic and other work-up including MRI brain, CT angiogram and EEG negative. Urine studies consistent with SIADH. Put on 2L restriction with improvement of symptoms. Discharged home on 06/20/20.   Last seen in clinic on 08/22/20 where he was doing significantly better.  Today, patient is feeling great overall. He is working out and energy is much better. Has gained weight and is now working to lose some. For exercising, he uses dumbells and works on his yard. Denies any chest pain, palpitaitons, lightheadedness or dizziness. Thryoid function is well controlled and Na is normal. He is followed closely by Endocrinology and  is overall doing very well.   Patient denies chest pain, shortness of breath, abdominal pain, diarrhea, palpitations, nausea, headaches, lightheadedness, LE Edema, syncope,orthopnea or PND.   Past Medical History:  Diagnosis Date   Arthritis    hands    Hypothyroidism     Past Surgical History:  Procedure Laterality Date   IR ANGIO INTRA EXTRACRAN SEL COM CAROTID INNOMINATE BILAT MOD SED  06/15/2020   IR ANGIO VERTEBRAL SEL VERTEBRAL BILAT MOD SED  06/15/2020   RADIOLOGY WITH ANESTHESIA N/A 06/15/2020   Procedure: IR WITH ANESTHESIA;  Surgeon: Radiologist, Medication, MD;  Location: [place];  Service: Radiology;  Laterality: N/A;   ROBOT ASSISTED LAPAROSCOPIC RADICAL PROSTATECTOMY N/A 12/24/2012   Procedure: ROBOTIC ASSISTED LAPAROSCOPIC RADICAL PROSTATECTOMY;  Surgeon: Bernestine Amass, MD;  Location: [facility];  Service: Urology;  Laterality: N/A;    Current Medications: Current Meds  Medication Sig   acetaminophen (TYLENOL) 325 MG tablet Take 325 mg by mouth every 6 (six) hours as needed for mild pain, fever or headache.   apixaban (ELIQUIS) 5 MG TABS tablet Take 1 tablet (5 mg total) by mouth 2 (two) times daily.   Cholecalciferol (VITAMIN D3) 125 MCG (5000 UT) CAPS Take 5,000 Units by mouth daily.   levothyroxine (SYNTHROID) 100 MCG tablet Take 1 tablet (100 mcg total) by mouth daily.   predniSONE (DELTASONE) 5 MG tablet Take 1 tablet (5 mg total) by mouth daily with breakfast.   rosuvastatin (CRESTOR) 20 MG tablet Take 1 tablet (20  mg total) by mouth daily.     Allergies:   Sulfa antibiotics   Social History   Socioeconomic History   Marital status: Married    Spouse name: Not on file   Number of children: Not on file   Years of education: Not on file   Highest education level: Not on file  Occupational History   Not on file  Tobacco Use   Smoking status: Never   Smokeless tobacco: Never  Substance and Sexual Activity   Alcohol use: Yes    Comment: rare   Drug use: No    Sexual activity: Not on file  Other Topics Concern   Not on file  Social History Narrative   Not on file   Social Determinants of Health   Financial Resource Strain: Not on file  Food Insecurity: Not on file  Transportation Needs: Not on file  Physical Activity: Not on file  Stress: Not on file  Social Connections: Not on file     Family History: The patient's family history is negative for Thyroid disease.  ROS:   Please see the history of present illness.    Review of Systems  Constitutional:  Negative for chills, fever and malaise/fatigue.  HENT:  Negative for hearing loss.   Eyes:  Negative for blurred vision and redness.  Respiratory:  Negative for shortness of breath.   Cardiovascular:  Negative for chest pain, palpitations, orthopnea, claudication, leg swelling and PND.  Gastrointestinal:  Negative for blood in stool, melena, nausea and vomiting.  Genitourinary:  Negative for dysuria and flank pain.  Musculoskeletal:  Negative for falls.  Neurological:  Negative for dizziness and loss of consciousness.  Endo/Heme/Allergies:  Negative for polydipsia.  Psychiatric/Behavioral:  Negative for substance abuse.    EKGs/Labs/Other Studies Reviewed:    The following studies were reviewed today:  TTE 04/28/20: IMPRESSIONS   1. Left ventricular ejection fraction, by estimation, is 50 to 55%. The  left ventricle has low normal function. The left ventricle has no regional  wall motion abnormalities. Left ventricular diastolic function could not  be evaluated.   2. Right ventricular systolic function is normal. The right ventricular  size is normal. There is normal pulmonary artery systolic pressure.   3. Right atrial size was mildly dilated.   4. The mitral valve is normal in structure. Mild mitral valve  regurgitation. No evidence of mitral stenosis.   5. The aortic valve is normal in structure. Aortic valve regurgitation is  not visualized. No aortic stenosis is present.     CT Chest 06/17/19: FINDINGS: CT CHEST FINDINGS   Cardiovascular: UPPER limits normal heart size noted. Coronary artery atherosclerotic calcification involving the LEFT main and LAD noted. Mild thoracic aortic atherosclerotic calcifications noted without aneurysm. No pericardial effusion.   Mediastinum/Nodes: No enlarged mediastinal, hilar, or axillary lymph nodes. Thyroid gland, trachea, and esophagus demonstrate no significant findings.   Lungs/Pleura: Mild to moderate atelectasis in both lung bases and in the SUPERIOR segment LEFT LOWER lobe noted.   There is no evidence of airspace disease, consolidation, mass, suspicious nodule, pleural effusion or pneumothorax.   Musculoskeletal: No acute or suspicious bony abnormalities are noted.   CT ABDOMEN PELVIS FINDINGS   Hepatobiliary: The liver and gallbladder are unremarkable. No biliary dilatation.   Pancreas: Unremarkable   Spleen: Unremarkable   Adrenals/Urinary Tract: The kidneys, adrenal glands and bladder are unremarkable.   Stomach/Bowel: Stomach is within normal limits. Appendix appears normal. No evidence of bowel wall thickening, distention, or  inflammatory changes.   Vascular/Lymphatic: Aortic atherosclerosis. No enlarged abdominal or pelvic lymph nodes.   Reproductive: Prostatectomy.   Other: No ascites, focal collection or pneumoperitoneum.   Musculoskeletal: No acute or suspicious bony abnormalities. 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   IMPRESSION: 1. No evidence of primary malignancy or metastatic disease. 2. Mild to moderate bibasilar and SUPERIOR segment LEFT LOWER lobe atelectasis. 3. Coronary artery disease. 4. Aortic Atherosclerosis (ICD10-I70.0).   MRI head 06/16/20: FINDINGS: Brain: Examination mildly degraded by motion artifact.   Cerebral volume within normal limits. Mild chronic microvascular ischemic disease noted  involving the periventricular white matter. No abnormal foci of restricted diffusion to suggest acute or subacute ischemia. Gray-white matter differentiation maintained. No encephalomalacia to suggest chronic cortical infarction. No evidence for acute or chronic intracranial hemorrhage.   1.4 x 1.1 x 1.2 cm well-circumscribed cystic lesion seen within the pituitary gland, indeterminate, but 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 Suprasellar extension without significant mass effect on the optic chiasm.   No other mass lesion, midline shift or mass effect. No hydrocephalus or extra-axial fluid collection.   Vascular: Major intracranial vascular flow voids are well maintained.   Skull and upper cervical spine: Craniocervical junction within normal limits. Bone marrow signal intensity normal. No scalp soft tissue abnormality.   Sinuses/Orbits: Patient status post bilateral ocular lens replacement. Globes and orbital soft tissues within normal limits. Fluid seen within the nasopharynx. Patient is intubated. Trace bilateral mastoid effusions noted.   Other: None.   IMPRESSION: 1. No acute intracranial abnormality. 2. Mild chronic microvascular ischemic disease for age. 3. 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  EKG:  12/22/2020: no ekg ordered today.  Recent Labs: 06/18/2020: Magnesium 2.0 06/28/2020: ALT 12; Hemoglobin 12.6; Platelets 255.0 11/01/2020: BUN 27; Creatinine, Ser 1.05; Potassium 4.2; Sodium 141; TSH 0.05  Recent Lipid Panel    Component Value Date/Time   CHOL 163 10/03/2020 1009   TRIG 86 10/03/2020 1009   HDL 92 10/03/2020 1009   CHOLHDL 1.8 10/03/2020 1009   CHOLHDL 3.5 06/16/2020 0344   VLDL 21 06/16/2020 0344   LDLCALC 55 10/03/2020 1009     Risk Assessment/Calculations:    CHA2DS2-VASc Score = 2  This indicates a 2.2% annual risk  of stroke. The patient's score is based upon: CHF History: No HTN History: No Diabetes History: No Stroke History: No Vascular Disease History: Yes Age Score: 1 Gender Score: 0    Physical Exam:    VS:  BP 132/80   Pulse 68   Ht 5\' 10"  (1.778 m)   Wt 216 lb 9.6 oz (98.2 kg)   SpO2 98%   BMI 31.08 kg/m     Wt Readings from Last 3 Encounters:  12/22/20 216 lb 9.6 oz (98.2 kg)  11/01/20 206 lb 6.4 oz (93.6 kg)  08/22/20 183 lb (83 kg)     GEN:  Well nourished, well developed in no acute distress HEENT: Normal NECK: No JVD; No carotid bruits CARDIAC: RRR, no murmurs, rubs, gallops RESPIRATORY:  CTAB, no wheezes ABDOMEN: Soft, non-tender, non-distended MUSCULOSKELETAL:  Warm, no edema SKIN: Warm and dry NEUROLOGIC:  Alert and oriented x 3 PSYCHIATRIC:  Normal affect   ASSESSMENT:    1. Atrial fibrillation, unspecified type ([place])   2. Hyponatremia   3. Thyroid disease   4. Hyperlipidemia, unspecified hyperlipidemia type   5. Weight loss  6. Coronary artery calcification     PLAN:    In order of problems listed above:  #Atrial fibrillation: CHADs-vasc 2 for age and coronary/aortic atherosclerosis noted on CT chest. Developed in the setting of elevated thyroid levels. Wanted to proceed with anticoagulation to reduce stroke risk as patient is pre-diabetic and has a positive family history for stroke in the family. Tolerating apixaban well with no bleeding. Not on BB. Back in NSR. -Continue eliquis 5mg  BID for anticoagulation -Not on BB as was hypotensive in the setting of Endocrinopathy; BP and HR well controlled. Will continue to monitor and add back if needed -TTE with LVEF 50-55%, no regional WMA   #Pituitary Insufficiency: #Central hypothyroidism #SIADH #Weight loss: Patient initially presented with unintentional wight loss of 40lbs with labs consistent with iatrogenic TSH. Also with hyponatremia and significant daytime somnolence with underlying  Endocrinopathy. Was referred to Dr. Garnette Scheuermann and was diagnosed with pituitary insufficiency. Now on prednisone, synthroid, and fluid restriction with remarkable improvement. Energy levels significantly improved. Gained weight. Na well controlled. No recurrent Afib. -Continue follow-up with Endocrine as scheduled -Continue synthroid, prednisone and fluid restriction   #Coronary Calcification: Noted on CT chest. Asymptomatic with no exertional chest pain, SOB or faitgue. LDL at goal of 55 on 10/03/20. -Continue crestor 20mg  daily   #Hyponatremia #Recent hospitalization with AMS, stroke-like symptoms: Resolved. Patient admitted in early Jan 2022 with left gaze deviation and right sided neglect as well as AMS. MRI head without stroke. CTA without significant vascular disease. Did not received tPA as on systemic AC. EEG without seizures. Thought symptoms driven by hyponatremia with Na 117. Urine studies consistent with SIADH. Currently feeling better with no recurrence of symptoms. On 1.5L fluid restriction. -Follow-up with Endocrinology as scheduled   #Weight loss: Significantly improved. Patient with profound weight loss thought to be driven by iatrogenic hyperthyroidism and pituitary insufficiency. CT chest/abd/pelvis without evidence of mass. He is s/p prostatectomy and last colonoscopy 1 year ago was okay. Now significantly improved with treatment of pituitary insufficiency. -Follow-up with endocrine as above -Fortunately, no evidence of malignancy on CT c/a/p  Follow up in 6 months.  Medication Adjustments/Labs and Tests Ordered: Current medicines are reviewed at length with the patient today.  Concerns regarding medicines are outlined above.  No orders of the defined types were placed in this encounter.  No orders of the defined types were placed in this encounter.   Patient Instructions  Medication Instructions:   Your physician recommends that you continue on your current  medications as directed. Please refer to the Current Medication list given to you today.  *If you need a refill on your cardiac medications before your next appointment, please call your pharmacy*    Follow-Up: At [facility], you and your health needs are our priority.  As part of our continuing mission to provide you with exceptional heart care, we have created designated Provider Care Teams.  These Care Teams include your primary Cardiologist (physician) and Advanced Practice Providers (APPs -  Physician Assistants and Nurse Practitioners) who all work together to provide you with the care you need, when you need it.  We recommend signing up for the patient portal called "MyChart".  Sign up information is provided on this After Visit Summary.  MyChart is used to connect with patients for Virtual Visits (Telemedicine).  Patients are able to view lab/test results, encounter notes, upcoming appointments, etc.  Non-urgent messages can be sent to your provider as well.   To learn more  about what you can do with MyChart, go to [email or web address].    Your next appointment:   6 month(s)  The format for your next appointment:   In Person  Provider:   Gwyndolyn Kaufman, MD      Ardell Isaacs as a scribe for Freada Bergeron, MD.,have documented all relevant documentation on the behalf of Freada Bergeron, MD,as directed by  Freada Bergeron, MD while in the presence of Freada Bergeron, MD.  I, Freada Bergeron, MD, have reviewed all documentation for this visit. The documentation on 12/22/20 for the exam, diagnosis, procedures, and orders are all accurate and complete.   Signed, Freada Bergeron, MD  12/22/2020 9:13 AM    [place]

## 2020-12-22 ENCOUNTER — Ambulatory Visit (INDEPENDENT_AMBULATORY_CARE_PROVIDER_SITE_OTHER): Payer: Medicare Other | Admitting: Cardiology

## 2020-12-22 ENCOUNTER — Other Ambulatory Visit: Payer: Self-pay

## 2020-12-22 VITALS — BP 132/80 | HR 68 | Ht 70.0 in | Wt 216.6 lb

## 2020-12-22 DIAGNOSIS — I2510 Atherosclerotic heart disease of native coronary artery without angina pectoris: Secondary | ICD-10-CM | POA: Diagnosis not present

## 2020-12-22 DIAGNOSIS — R634 Abnormal weight loss: Secondary | ICD-10-CM

## 2020-12-22 DIAGNOSIS — E079 Disorder of thyroid, unspecified: Secondary | ICD-10-CM | POA: Diagnosis not present

## 2020-12-22 DIAGNOSIS — I2584 Coronary atherosclerosis due to calcified coronary lesion: Secondary | ICD-10-CM | POA: Diagnosis not present

## 2020-12-22 DIAGNOSIS — E785 Hyperlipidemia, unspecified: Secondary | ICD-10-CM | POA: Diagnosis not present

## 2020-12-22 DIAGNOSIS — I4891 Unspecified atrial fibrillation: Secondary | ICD-10-CM | POA: Diagnosis not present

## 2020-12-22 DIAGNOSIS — E871 Hypo-osmolality and hyponatremia: Secondary | ICD-10-CM

## 2020-12-22 NOTE — Patient Instructions (Signed)
Medication Instructions:   Your physician recommends that you continue on your current medications as directed. Please refer to the Current Medication list given to you today.  *If you need a refill on your cardiac medications before your next appointment, please call your pharmacy*   Follow-Up: At [facility], you and your health needs are our priority.  As part of our continuing mission to provide you with exceptional heart care, we have created designated Provider Care Teams.  These Care Teams include your primary Cardiologist (physician) and Advanced Practice Providers (APPs -  Physician Assistants and Nurse Practitioners) who all work together to provide you with the care you need, when you need it.  We recommend signing up for the patient portal called "MyChart".  Sign up information is provided on this After Visit Summary.  MyChart is used to connect with patients for Virtual Visits (Telemedicine).  Patients are able to view lab/test results, encounter notes, upcoming appointments, etc.  Non-urgent messages can be sent to your provider as well.   To learn more about what you can do with MyChart, go to [email or web address].    Your next appointment:   6 month(s)  The format for your next appointment:   In Person  Provider:   Heather Pemberton, MD     

## 2021-01-03 DIAGNOSIS — Z85828 Personal history of other malignant neoplasm of skin: Secondary | ICD-10-CM | POA: Diagnosis not present

## 2021-01-03 DIAGNOSIS — L819 Disorder of pigmentation, unspecified: Secondary | ICD-10-CM | POA: Diagnosis not present

## 2021-01-03 DIAGNOSIS — L814 Other melanin hyperpigmentation: Secondary | ICD-10-CM | POA: Diagnosis not present

## 2021-01-03 DIAGNOSIS — L821 Other seborrheic keratosis: Secondary | ICD-10-CM | POA: Diagnosis not present

## 2021-01-03 DIAGNOSIS — L905 Scar conditions and fibrosis of skin: Secondary | ICD-10-CM | POA: Diagnosis not present

## 2021-01-03 DIAGNOSIS — D1801 Hemangioma of skin and subcutaneous tissue: Secondary | ICD-10-CM | POA: Diagnosis not present

## 2021-02-07 DIAGNOSIS — Z961 Presence of intraocular lens: Secondary | ICD-10-CM | POA: Diagnosis not present

## 2021-02-07 DIAGNOSIS — H35372 Puckering of macula, left eye: Secondary | ICD-10-CM | POA: Diagnosis not present

## 2021-03-07 ENCOUNTER — Other Ambulatory Visit: Payer: Self-pay

## 2021-03-07 ENCOUNTER — Ambulatory Visit (INDEPENDENT_AMBULATORY_CARE_PROVIDER_SITE_OTHER): Payer: Medicare Other | Admitting: Endocrinology

## 2021-03-07 VITALS — BP 120/80 | HR 80 | Ht 70.0 in | Wt 225.4 lb

## 2021-03-07 DIAGNOSIS — E038 Other specified hypothyroidism: Secondary | ICD-10-CM

## 2021-03-07 DIAGNOSIS — E871 Hypo-osmolality and hyponatremia: Secondary | ICD-10-CM | POA: Diagnosis not present

## 2021-03-07 DIAGNOSIS — I639 Cerebral infarction, unspecified: Secondary | ICD-10-CM | POA: Diagnosis not present

## 2021-03-07 LAB — T4, FREE: Free T4: 0.82 ng/dL (ref 0.60–1.60)

## 2021-03-07 LAB — BASIC METABOLIC PANEL
BUN: 22 mg/dL (ref 6–23)
CO2: 30 mEq/L (ref 19–32)
Calcium: 9.7 mg/dL (ref 8.4–10.5)
Chloride: 102 mEq/L (ref 96–112)
Creatinine, Ser: 1.05 mg/dL (ref 0.40–1.50)
GFR: 69.58 mL/min (ref >60.00)
Glucose, Bld: 99 mg/dL (ref 70–99)
Potassium: 4.4 mEq/L (ref 3.5–5.1)
Sodium: 139 mEq/L (ref 135–145)

## 2021-03-07 LAB — TSH: TSH: 0.07 u[IU]/mL — ABNORMAL LOW (ref 0.35–5.50)

## 2021-03-07 NOTE — Patient Instructions (Addendum)
Let's plan to recheck the ultrasound and MRI next year.   Blood tests are requested for you today.  We'll let you know about the results.  If necessary, you should resume the demeclocycline.     Please come back for a follow-up appointment in 4 months.

## 2021-03-07 NOTE — Progress Notes (Signed)
Subjective:    Patient ID: Jermaine Soto, male    DOB: Oct 24, 1945, 75 y.o.   MRN: [id number]  HPI Pt has MNG (dx'ed 2021; [place] in 2022 advised f/u in 1 year) Hypothyroidism: he has been on synthroid since 2016.  Pt also has hyponatremia (dx'ed 2014; pt denies ACEI, duiretic, adrenal insuff, SIADH, cirrhosis, CHF, nephrotic synd, diarrhea, n/v, brain injury, pseudo, psychogenic polydipsia, pulm dz, chemotx, narcotics, SSRI, or GBS; he is on 1500 cc fluid restr; he stopped declomycin, due to cost).   He also has pituitary cyst (dx'ed 2022) He has these pituitary functions:  ACTH needs and takes prednisone.   Prolactin normal FSH/LH: low (he has h/o prostate cancer, so not a candidate for rx).   TSH needs and takes synthroid.  (hypothyroidism is now prob a combination of primary and secondary).   VP: low and sodium is intermittently low.  pt states he feels well in general. He takes meds as rx'ed.   Past Medical History:  Diagnosis Date   Arthritis    hands    Hypothyroidism     Past Surgical History:  Procedure Laterality Date   IR ANGIO INTRA EXTRACRAN SEL COM CAROTID INNOMINATE BILAT MOD SED  06/15/2020   IR ANGIO VERTEBRAL SEL VERTEBRAL BILAT MOD SED  06/15/2020   RADIOLOGY WITH ANESTHESIA N/A 06/15/2020   Procedure: IR WITH ANESTHESIA;  Surgeon: Radiologist, Medication, MD;  Location: [place];  Service: Radiology;  Laterality: N/A;   ROBOT ASSISTED LAPAROSCOPIC RADICAL PROSTATECTOMY N/A 12/24/2012   Procedure: ROBOTIC ASSISTED LAPAROSCOPIC RADICAL PROSTATECTOMY;  Surgeon: Bernestine Amass, MD;  Location: [facility];  Service: Urology;  Laterality: N/A;    Social History   Socioeconomic History   Marital status: Married    Spouse name: Not on file   Number of children: Not on file   Years of education: Not on file   Highest education level: Not on file  Occupational History   Not on file  Tobacco Use   Smoking status: Never   Smokeless tobacco: Never  Substance and Sexual Activity    Alcohol use: Yes    Comment: rare   Drug use: No   Sexual activity: Not on file  Other Topics Concern   Not on file  Social History Narrative   Not on file   Social Determinants of Health   Financial Resource Strain: Not on file  Food Insecurity: Not on file  Transportation Needs: Not on file  Physical Activity: Not on file  Stress: Not on file  Social Connections: Not on file  Intimate Partner Violence: Not on file    Current Outpatient Medications on File Prior to Visit  Medication Sig Dispense Refill   acetaminophen (TYLENOL) 325 MG tablet Take 325 mg by mouth every 6 (six) hours as needed for mild pain, fever or headache.     apixaban (ELIQUIS) 5 MG TABS tablet Take 1 tablet (5 mg total) by mouth 2 (two) times daily. 180 tablet 2   Cholecalciferol (VITAMIN D3) 125 MCG (5000 UT) CAPS Take 5,000 Units by mouth daily.     diphenhydrAMINE HCl (ZZZQUIL) 50 MG/30ML LIQD Take 30 mLs by mouth as needed (sleep).     levothyroxine (SYNTHROID) 100 MCG tablet Take 1 tablet (100 mcg total) by mouth daily. 90 tablet 3   predniSONE (DELTASONE) 5 MG tablet Take 1 tablet (5 mg total) by mouth daily with breakfast. 90 tablet 3   rosuvastatin (CRESTOR) 20 MG tablet Take 1 tablet (20 mg  total) by mouth daily. 90 tablet 3   metoprolol tartrate (LOPRESSOR) 25 MG tablet Take 25 mg by mouth daily.     No current facility-administered medications on file prior to visit.    Allergies  Allergen Reactions   Sulfa Antibiotics     Unknown    Family History  Problem Relation Age of Onset   Thyroid disease Neg Hx     BP 120/80 (BP Location: Right Arm, Patient Position: Sitting, Cuff Size: Large)   Pulse 80   Ht 5\' 10"  (1.778 m)   Wt 225 lb 6.4 oz (102.2 kg)   SpO2 98%   BMI 32.34 kg/m    Review of Systems Denies HA and visual loss.     Objective:   Physical Exam NECK: There is no palpable thyroid enlargement.  No thyroid nodule is palpable.  No palpable lymphadenopathy at the anterior  neck. EXT: no leg edema.     Lab Results  Component Value Date   TSH 0.07 (L) 03/07/2021      Assessment & Plan:  Hypothyroidism: well-controlled. Please continue the same synthroid.  Hyponatremia: recheck today.   Patient Instructions  Let's plan to recheck the ultrasound and MRI next year.   Blood tests are requested for you today.  We'll let you know about the results.  If necessary, you should resume the demeclocycline.     Please come back for a follow-up appointment in 4 months.

## 2021-04-19 DIAGNOSIS — Z20822 Contact with and (suspected) exposure to covid-19: Secondary | ICD-10-CM | POA: Diagnosis not present

## 2021-05-18 DIAGNOSIS — Z8546 Personal history of malignant neoplasm of prostate: Secondary | ICD-10-CM | POA: Diagnosis not present

## 2021-05-25 DIAGNOSIS — Z8546 Personal history of malignant neoplasm of prostate: Secondary | ICD-10-CM | POA: Diagnosis not present

## 2021-05-25 DIAGNOSIS — N393 Stress incontinence (female) (male): Secondary | ICD-10-CM | POA: Diagnosis not present

## 2021-06-20 DIAGNOSIS — Z85828 Personal history of other malignant neoplasm of skin: Secondary | ICD-10-CM | POA: Diagnosis not present

## 2021-06-20 DIAGNOSIS — R208 Other disturbances of skin sensation: Secondary | ICD-10-CM | POA: Diagnosis not present

## 2021-06-20 DIAGNOSIS — S50911A Unspecified superficial injury of right forearm, initial encounter: Secondary | ICD-10-CM | POA: Diagnosis not present

## 2021-06-20 DIAGNOSIS — D225 Melanocytic nevi of trunk: Secondary | ICD-10-CM | POA: Diagnosis not present

## 2021-06-20 DIAGNOSIS — L298 Other pruritus: Secondary | ICD-10-CM | POA: Diagnosis not present

## 2021-06-20 DIAGNOSIS — L820 Inflamed seborrheic keratosis: Secondary | ICD-10-CM | POA: Diagnosis not present

## 2021-06-20 DIAGNOSIS — L853 Xerosis cutis: Secondary | ICD-10-CM | POA: Diagnosis not present

## 2021-06-20 DIAGNOSIS — L814 Other melanin hyperpigmentation: Secondary | ICD-10-CM | POA: Diagnosis not present

## 2021-06-20 DIAGNOSIS — L570 Actinic keratosis: Secondary | ICD-10-CM | POA: Diagnosis not present

## 2021-06-20 DIAGNOSIS — D485 Neoplasm of uncertain behavior of skin: Secondary | ICD-10-CM | POA: Diagnosis not present

## 2021-06-20 DIAGNOSIS — Z08 Encounter for follow-up examination after completed treatment for malignant neoplasm: Secondary | ICD-10-CM | POA: Diagnosis not present

## 2021-06-20 DIAGNOSIS — L821 Other seborrheic keratosis: Secondary | ICD-10-CM | POA: Diagnosis not present

## 2021-07-10 ENCOUNTER — Other Ambulatory Visit: Payer: Self-pay

## 2021-07-10 ENCOUNTER — Ambulatory Visit (INDEPENDENT_AMBULATORY_CARE_PROVIDER_SITE_OTHER): Payer: Medicare Other | Admitting: Endocrinology

## 2021-07-10 VITALS — BP 144/80 | HR 81 | Ht 70.0 in | Wt 231.0 lb

## 2021-07-10 DIAGNOSIS — E042 Nontoxic multinodular goiter: Secondary | ICD-10-CM | POA: Diagnosis not present

## 2021-07-10 DIAGNOSIS — E038 Other specified hypothyroidism: Secondary | ICD-10-CM

## 2021-07-10 DIAGNOSIS — E236 Other disorders of pituitary gland: Secondary | ICD-10-CM | POA: Diagnosis not present

## 2021-07-10 DIAGNOSIS — E871 Hypo-osmolality and hyponatremia: Secondary | ICD-10-CM | POA: Diagnosis not present

## 2021-07-10 LAB — BASIC METABOLIC PANEL
BUN: 25 mg/dL — ABNORMAL HIGH (ref 6–23)
CO2: 26 mEq/L (ref 19–32)
Calcium: 9.5 mg/dL (ref 8.4–10.5)
Chloride: 103 mEq/L (ref 96–112)
Creatinine, Ser: 1.1 mg/dL (ref 0.40–1.50)
GFR: 65.64 mL/min (ref >60.00)
Glucose, Bld: 101 mg/dL — ABNORMAL HIGH (ref 70–99)
Potassium: 4 mEq/L (ref 3.5–5.1)
Sodium: 140 mEq/L (ref 135–145)

## 2021-07-10 LAB — T4, FREE: Free T4: 0.97 ng/dL (ref 0.60–1.60)

## 2021-07-10 LAB — TSH: TSH: 0.03 u[IU]/mL — ABNORMAL LOW (ref 0.35–5.50)

## 2021-07-10 NOTE — Progress Notes (Signed)
Subjective:    Patient ID: Jermaine Soto, male    DOB: 07/15/1945, 76 y.o.   MRN: [id number]  HPI Pt has MNG (dx'ed 2021; [place] in 2022 advised f/u in 1 year) Hypothyroidism: he has been on synthroid since 2016.  Pt also has hyponatremia (dx'ed 2014; pt denies ACEI, duiretic, adrenal insuff, SIADH, cirrhosis, CHF, nephrotic synd, diarrhea, n/v, brain injury, pseudo, psychogenic polydipsia, pulm dz, chemotx, narcotics, SSRI, or GBS; he is on 1500 cc fluid restr; he stopped declomycin, due to cost).   He also has pituitary cyst (dx'ed 2022) He has these pituitary functions:  ACTH needs and takes prednisone.   Prolactin normal FSH/LH: low (he has h/o prostate cancer, so not a candidate for rx).   TSH needs and takes synthroid.  (hypothyroidism is now prob a combination of primary and secondary).   VP: low and sodium is intermittently low.  pt states he feels well in general. He takes meds as rx'ed.   Past Medical History:  Diagnosis Date   Arthritis    hands    Hypothyroidism     Past Surgical History:  Procedure Laterality Date   IR ANGIO INTRA EXTRACRAN SEL COM CAROTID INNOMINATE BILAT MOD SED  06/15/2020   IR ANGIO VERTEBRAL SEL VERTEBRAL BILAT MOD SED  06/15/2020   RADIOLOGY WITH ANESTHESIA N/A 06/15/2020   Procedure: IR WITH ANESTHESIA;  Surgeon: Radiologist, Medication, MD;  Location: [facility];  Service: Radiology;  Laterality: N/A;   ROBOT ASSISTED LAPAROSCOPIC RADICAL PROSTATECTOMY N/A 12/24/2012   Procedure: ROBOTIC ASSISTED LAPAROSCOPIC RADICAL PROSTATECTOMY;  Surgeon: Bernestine Amass, MD;  Location: [facility];  Service: Urology;  Laterality: N/A;    Social History   Socioeconomic History   Marital status: Married    Spouse name: Not on file   Number of children: Not on file   Years of education: Not on file   Highest education level: Not on file  Occupational History   Not on file  Tobacco Use   Smoking status: Never   Smokeless tobacco: Never  Substance and Sexual Activity    Alcohol use: Yes    Comment: rare   Drug use: No   Sexual activity: Not on file  Other Topics Concern   Not on file  Social History Narrative   Not on file   Social Determinants of Health   Financial Resource Strain: Not on file  Food Insecurity: Not on file  Transportation Needs: Not on file  Physical Activity: Not on file  Stress: Not on file  Social Connections: Not on file  Intimate Partner Violence: Not on file    Current Outpatient Medications on File Prior to Visit  Medication Sig Dispense Refill   acetaminophen (TYLENOL) 325 MG tablet Take 325 mg by mouth every 6 (six) hours as needed for mild pain, fever or headache.     apixaban (ELIQUIS) 5 MG TABS tablet Take 1 tablet (5 mg total) by mouth 2 (two) times daily. 180 tablet 2   Cholecalciferol (VITAMIN D3) 125 MCG (5000 UT) CAPS Take 5,000 Units by mouth daily.     rosuvastatin (CRESTOR) 20 MG tablet Take 1 tablet (20 mg total) by mouth daily. 90 tablet 3   diphenhydrAMINE HCl (ZZZQUIL) 50 MG/30ML LIQD Take 30 mLs by mouth as needed (sleep).     metoprolol tartrate (LOPRESSOR) 25 MG tablet Take 25 mg by mouth daily.     No current facility-administered medications on file prior to visit.    Allergies  Allergen  Reactions   Sulfa Antibiotics     Unknown    Family History  Problem Relation Age of Onset   Thyroid disease Neg Hx     BP (!) 144/80    Pulse 81    Ht 5\' 10"  (1.778 m)    Wt 231 lb (104.8 kg)    SpO2 98%    BMI 33.15 kg/m    Review of Systems     Objective:   Physical Exam VITAL SIGNS:  See vs page GENERAL: no distress.   EXT: no leg edema.     Lab Results  Component Value Date   CREATININE 1.10 07/10/2021   BUN 25 (H) 07/10/2021   NA 140 07/10/2021   K 4.0 07/10/2021   CL 103 07/10/2021   CO2 26 07/10/2021  Free T4=0.97 Lab Results  Component Value Date   TSH 0.03 (L) 07/10/2021      Assessment & Plan:  Hyponatremia: stable off rx.  I told pt no medication is needed now [place]  hypothyroidism: well-controlled.  Please continue the same synthroid Pituitary cyst: due for recheck.  I ordered MRI

## 2021-07-10 NOTE — Patient Instructions (Addendum)
Let's recheck the ultrasound and MRI.  you will receive a phone call, about a day and time for an appointment.   Blood tests are requested for you today.  We'll let you know about the results.   If necessary, you should resume the demeclocycline.     Please come back for a follow-up appointment in 6 months.

## 2021-07-11 ENCOUNTER — Other Ambulatory Visit: Payer: Self-pay | Admitting: Endocrinology

## 2021-07-17 ENCOUNTER — Other Ambulatory Visit: Payer: Self-pay

## 2021-07-17 ENCOUNTER — Ambulatory Visit (HOSPITAL_BASED_OUTPATIENT_CLINIC_OR_DEPARTMENT_OTHER)
Admission: RE | Admit: 2021-07-17 | Discharge: 2021-07-17 | Disposition: A | Discharge status: Home / Self Care | Payer: Medicare Other | Source: Ambulatory Visit | Attending: Endocrinology | Admitting: Endocrinology

## 2021-07-17 ENCOUNTER — Telehealth: Payer: Self-pay | Admitting: Endocrinology

## 2021-07-17 DIAGNOSIS — E042 Nontoxic multinodular goiter: Secondary | ICD-10-CM | POA: Diagnosis not present

## 2021-07-17 NOTE — Telephone Encounter (Signed)
Patient called about MR-Brain scan without contrast. He stated that [facility] does not perform this service. His question is who will schedule this for him?

## 2021-07-18 ENCOUNTER — Ambulatory Visit (HOSPITAL_BASED_OUTPATIENT_CLINIC_OR_DEPARTMENT_OTHER): Payer: Medicare Other

## 2021-08-16 ENCOUNTER — Other Ambulatory Visit: Payer: Self-pay

## 2021-08-16 MED ORDER — ROSUVASTATIN CALCIUM 20 MG PO TABS: 20.0000 mg | ORAL_TABLET | Freq: Every day | ORAL | 1 refills | Status: DC

## 2021-08-28 DIAGNOSIS — Z20822 Contact with and (suspected) exposure to covid-19: Secondary | ICD-10-CM | POA: Diagnosis not present

## 2021-08-31 ENCOUNTER — Ambulatory Visit
Admission: RE | Admit: 2021-08-31 | Discharge: 2021-08-31 | Disposition: A | Discharge status: Home / Self Care | Payer: Medicare Other | Source: Ambulatory Visit | Attending: Endocrinology | Admitting: Endocrinology

## 2021-08-31 ENCOUNTER — Other Ambulatory Visit: Payer: Self-pay

## 2021-08-31 DIAGNOSIS — E236 Other disorders of pituitary gland: Secondary | ICD-10-CM | POA: Diagnosis not present

## 2021-08-31 DIAGNOSIS — J341 Cyst and mucocele of nose and nasal sinus: Secondary | ICD-10-CM | POA: Diagnosis not present

## 2021-08-31 MED ORDER — GADOBENATE DIMEGLUMINE 529 MG/ML IV SOLN
10.0000 mL | Freq: Once | INTRAVENOUS | Status: AC | PRN
  Administered 2021-08-31: 10 mL via INTRAVENOUS

## 2021-09-03 ENCOUNTER — Encounter: Payer: Self-pay | Admitting: Endocrinology

## 2021-09-12 ENCOUNTER — Other Ambulatory Visit: Payer: Self-pay

## 2021-09-12 MED ORDER — APIXABAN 5 MG PO TABS: 5.0000 mg | ORAL_TABLET | Freq: Two times a day (BID) | ORAL | 1 refills | Status: DC

## 2021-09-12 NOTE — Telephone Encounter (Signed)
Pt last saw Dr Johney Frame 12/22/20, last labs 07/10/21 Creat 1.10, age 76, weight 104.8kg, based on specified criteria pt is on appropriate dosage of Eliquis '5mg'$  BID for afib.  Will refill rx.  ?

## 2021-09-18 DIAGNOSIS — Z20822 Contact with and (suspected) exposure to covid-19: Secondary | ICD-10-CM | POA: Diagnosis not present

## 2021-10-07 NOTE — Progress Notes (Deleted)
?Cardiology Office Note:   ? ?Date:  10/11/2021  ? ?ID:  Jermaine Soto, DOB 1946/02/19, MRN [id number] ? ?PCP:  Jermaine Soto, [place] ?  ?[place]  ?Cardiologist:  Jermaine Bergeron, MD  ?Advanced Practice Provider:  No care team member to display ?Electrophysiologist:  None  ? ? ?Referring MD: Jermaine Soto,*  ? ? ? ?History of Present Illness:   ? ?Jermaine Soto is a 76 y.o. male with a hx of hypothyroidism and Afib who presents to clinic for follow-up. ? ?Patient initially seen on 04/06/20, after he was noted to be in Afib in the office with his PCP. He was started on apixaban for [facility]. He was also notably losing significant amount of weight in the setting of hyperthyroidism (TSH 0.088) from supratherapeutic doses of synthroid. We decreased his synthroid to 37.103mg at that time. TTE showed LVEF 50-55%, no WMA, mild RAE, normal LA, no significant valvular disease.  ? . ?[facility] hyponatremia detected on out-patient lab work with Na 125. Thought may be due to poor po intake as glucose normal, TSH improved, no GI symptoms. He was given IVF with improvement. Repeat Na 128. ?  ?[facility] with left gaze preference and right sided neglect and global aphasia. MRI was negative. He was found to be profoundly hyponatremic and other work-up including MRI brain, CT angiogram and EEG negative. Urine studies consistent with SIADH. Put on 2L restriction with improvement of symptoms. Discharged home on 06/20/20. ?  ?Last seen in clinic on 12/2020 where he was doing well from a CV standpoint.  ? ?Today, *** ? ? ?Past Medical History:  ?Diagnosis Date  ? Arthritis   ? hands   ? Hypothyroidism   ? ? ?Past Surgical History:  ?Procedure Laterality Date  ? IR ANGIO INTRA EXTRACRAN SEL COM CAROTID INNOMINATE BILAT MOD SED  06/15/2020  ? IR ANGIO VERTEBRAL SEL VERTEBRAL BILAT MOD SED  06/15/2020  ? RADIOLOGY WITH ANESTHESIA N/A 06/15/2020  ? Procedure: IR  WITH ANESTHESIA;  Surgeon: Radiologist, Medication, MD;  [place]  Service: Radiology;  Laterality: N/A;  ? ROBOT ASSISTED LAPAROSCOPIC RADICAL PROSTATECTOMY N/A 12/24/2012  ? Procedure: ROBOTIC ASSISTED LAPAROSCOPIC RADICAL PROSTATECTOMY;  Surgeon: Jermaine Amass MD;  [facility];  Service: Urology;  Laterality: N/A;  ? ? ?Current Medications: ?Current Meds  ?Medication Sig  ? acetaminophen (TYLENOL) 325 MG tablet Take 325 mg by mouth every 6 (six) hours as needed for mild pain, fever or headache.  ? apixaban (ELIQUIS) 5 MG TABS tablet Take 1 tablet (5 mg total) by mouth 2 (two) times daily.  ? Cholecalciferol (VITAMIN D3) 125 MCG (5000 UT) CAPS Take 5,000 Units by mouth daily.  ? levothyroxine (SYNTHROID) 100 MCG tablet TAKE ONE TABLET BY MOUTH DAILY  ? predniSONE (DELTASONE) 5 MG tablet TAKE ONE TABLET BY MOUTH DAILY WITH BREAKFAST  ? rosuvastatin (CRESTOR) 20 MG tablet Take 1 tablet (20 mg total) by mouth daily.  ?  ? ?Allergies:   Sulfa antibiotics  ? ?Social History  ? ?Socioeconomic History  ? Marital status: Married  ?  Spouse name: Not on file  ? Number of children: Not on file  ? Years of education: Not on file  ? Highest education level: Not on file  ?Occupational History  ? Not on file  ?Tobacco Use  ? Smoking status: Never  ? Smokeless tobacco: Never  ?Substance and Sexual Activity  ?  Alcohol use: Yes  ?  Comment: rare  ? Drug use: No  ? Sexual activity: Not on file  ?Other Topics Concern  ? Not on file  ?Social History Narrative  ? Not on file  ? ?Social Determinants of Health  ? ?Financial Resource Strain: Not on file  ?Food Insecurity: Not on file  ?Transportation Needs: Not on file  ?Physical Activity: Not on file  ?Stress: Not on file  ?Social Connections: Not on file  ?  ? ?Family History: ?The patient's family history is negative for Thyroid disease. ? ?ROS:   ?Please see the history of present illness.    ?Review of Systems  ?Constitutional:  Negative for chills, fever and  malaise/fatigue.  ?HENT:  Negative for hearing loss.   ?Eyes:  Negative for blurred vision and redness.  ?Respiratory:  Negative for shortness of breath.   ?Cardiovascular:  Negative for chest pain, palpitations, orthopnea, claudication, leg swelling and PND.  ?Gastrointestinal:  Negative for blood in stool, melena, nausea and vomiting.  ?Genitourinary:  Negative for dysuria and flank pain.  ?Musculoskeletal:  Negative for falls.  ?Neurological:  Negative for dizziness and loss of consciousness.  ?Endo/Heme/Allergies:  Negative for polydipsia.  ?Psychiatric/Behavioral:  Negative for substance abuse.   ? ?EKGs/Labs/Other Studies Reviewed:   ? ?The following studies were reviewed today: ? ?TTE 04/28/20: ?IMPRESSIONS  ? 1. Left ventricular ejection fraction, by estimation, is 50 to 55%. The  ?left ventricle has low normal function. The left ventricle has no regional  ?wall motion abnormalities. Left ventricular diastolic function could not  ?be evaluated.  ? 2. Right ventricular systolic function is normal. The right ventricular  ?size is normal. There is normal pulmonary artery systolic pressure.  ? 3. Right atrial size was mildly dilated.  ? 4. The mitral valve is normal in structure. Mild mitral valve  ?regurgitation. No evidence of mitral stenosis.  ? 5. The aortic valve is normal in structure. Aortic valve regurgitation is  ?not visualized. No aortic stenosis is present.  ?  ?CT Chest 06/17/19: ?FINDINGS: ?CT CHEST FINDINGS ?  ?Cardiovascular: UPPER limits normal heart size noted. Coronary ?artery atherosclerotic calcification involving the LEFT main and LAD ?noted. Mild thoracic aortic atherosclerotic calcifications noted ?without aneurysm. No pericardial effusion. ?  ?Mediastinum/Nodes: No enlarged mediastinal, hilar, or axillary lymph ?nodes. Thyroid gland, trachea, and esophagus demonstrate no ?significant findings. ?  ?Lungs/Pleura: Mild to moderate atelectasis in both lung bases and in ?the SUPERIOR segment  LEFT LOWER lobe noted. ?  ?There is no evidence of airspace disease, consolidation, mass, ?suspicious nodule, pleural effusion or pneumothorax. ?  ?Musculoskeletal: No acute or suspicious bony abnormalities are ?noted. ?  ?CT ABDOMEN PELVIS FINDINGS ?  ?Hepatobiliary: The liver and gallbladder are unremarkable. No ?biliary dilatation. ?  ?Pancreas: Unremarkable ?  ?Spleen: Unremarkable ?  ?Adrenals/Urinary Tract: The kidneys, adrenal glands and bladder are ?unremarkable. ?  ?Stomach/Bowel: Stomach is within normal limits. Appendix appears ?normal. No evidence of bowel wall thickening, distention, or ?inflammatory changes. ?  ?Vascular/Lymphatic: Aortic atherosclerosis. No enlarged abdominal or ?pelvic lymph nodes. ?  ?Reproductive: Prostatectomy. ?  ?Other: No ascites, focal collection or pneumoperitoneum. ?  ?Musculoskeletal: No acute or suspicious bony abnormalities. ?Multilevel degenerative disc disease/spondylosis the lumbar spine ?identified, moderate at L4-5. ?  ?IMPRESSION: ?1. No evidence of primary malignancy or metastatic disease. ?2. Mild to moderate bibasilar and SUPERIOR segment LEFT LOWER lobe ?atelectasis. ?3. Coronary artery disease. ?4. Aortic Atherosclerosis (ICD10-I70.0). ?  ?MRI head 06/16/20: ?FINDINGS: ?Brain: Examination  mildly degraded by motion artifact. ?  ?Cerebral volume within normal limits. Mild chronic microvascular ?ischemic disease noted involving the periventricular white matter. ?No abnormal foci of restricted diffusion to suggest acute or ?subacute ischemia. Gray-white matter differentiation maintained. No ?encephalomalacia to suggest chronic cortical infarction. No evidence ?for acute or chronic intracranial hemorrhage. ?  ?1.4 x 1.1 x 1.2 cm well-circumscribed cystic lesion seen within the ?pituitary gland, indeterminate, but could reflect a pars intermedia ?or Rathke's cleft cyst. Suprasellar extension without significant ?mass effect on the optic chiasm. ?  ?No other mass  lesion, midline shift or mass effect. No hydrocephalus ?or extra-axial fluid collection. ?  ?Vascular: Major intracranial vascular flow voids are well ?maintained. ?  ?Skull and upper cervical spine: Craniocervica

## 2021-10-11 ENCOUNTER — Encounter: Payer: Self-pay | Admitting: Cardiology

## 2021-10-11 ENCOUNTER — Ambulatory Visit (INDEPENDENT_AMBULATORY_CARE_PROVIDER_SITE_OTHER): Payer: Medicare Other | Admitting: Cardiology

## 2021-10-11 VITALS — BP 128/76 | HR 68 | Ht 70.0 in | Wt 228.4 lb

## 2021-10-11 DIAGNOSIS — E871 Hypo-osmolality and hyponatremia: Secondary | ICD-10-CM | POA: Diagnosis not present

## 2021-10-11 DIAGNOSIS — R634 Abnormal weight loss: Secondary | ICD-10-CM

## 2021-10-11 DIAGNOSIS — E785 Hyperlipidemia, unspecified: Secondary | ICD-10-CM

## 2021-10-11 DIAGNOSIS — I2510 Atherosclerotic heart disease of native coronary artery without angina pectoris: Secondary | ICD-10-CM

## 2021-10-11 DIAGNOSIS — E079 Disorder of thyroid, unspecified: Secondary | ICD-10-CM

## 2021-10-11 DIAGNOSIS — I2584 Coronary atherosclerosis due to calcified coronary lesion: Secondary | ICD-10-CM | POA: Diagnosis not present

## 2021-10-11 DIAGNOSIS — I4891 Unspecified atrial fibrillation: Secondary | ICD-10-CM | POA: Diagnosis not present

## 2021-10-11 LAB — LIPID PANEL
Chol/HDL Ratio: 2 ratio (ref 0.0–5.0)
Cholesterol, Total: 135 mg/dL (ref 100–199)
HDL: 69 mg/dL (ref >39)
LDL Chol Calc (NIH): 48 mg/dL (ref 0–99)
Triglycerides: 98 mg/dL (ref 0–149)
VLDL Cholesterol Cal: 18 mg/dL (ref 5–40)

## 2021-10-11 NOTE — Progress Notes (Signed)
?Cardiology Office Note:   ? ?Date:  10/11/2021  ? ?ID:  Jermaine Soto, DOB Mar 06, 1946, MRN [id number] ? ?PCP:  Jermaine Soto, [place] ?  ?[place]  ?Cardiologist:  Jermaine Bergeron, MD  ?Advanced Practice Provider:  No care team member to display ?Electrophysiologist:  None  ? ? ?Referring MD: Jermaine Soto,*  ? ? ? ?History of Present Illness:   ? ?Jermaine Soto is a 76 y.o. male with a hx of hypothyroidism and Afib who presents to clinic for follow-up. ? ?Patient initially seen on 04/06/20, after he was noted to be in Afib in the office with his PCP. He was started on apixaban for [facility]. He was also notably losing significant amount of weight in the setting of hyperthyroidism (TSH 0.088) from supratherapeutic doses of synthroid. We decreased his synthroid to 37.108mg at that time. TTE showed LVEF 50-55%, no WMA, mild RAE, normal LA, no significant valvular disease.  ? . ?[facility] hyponatremia detected on out-patient lab work with Na 125. Thought may be due to poor po intake as glucose normal, TSH improved, no GI symptoms. He was given IVF with improvement. Repeat Na 128. ?  ?[facility] left gaze preference and right sided neglect and global aphasia. MRI was negative. He was found to be profoundly hyponatremic and other work-up including MRI brain, CT angiogram and EEG negative. Urine studies consistent with SIADH. Put on 2L restriction with improvement of symptoms. Discharged home on 06/20/20. ?  ?Last seen in clinic on 12/2020 where he was doing well from a CV standpoint.  ? ?Today, the patient states that he is feeling great. Every once in a while he notices mild shortness of breath, but this does not seem to concern him today. He denies feeling any recurrent arrhythmias. ? ?No significant bleeding issues on Apixaban. If he does suffer any scratches or abrasions to his skin, he does notice it is slow to heal.  ? ?He  denies any palpitations, chest pain, or peripheral edema. No lightheadedness, headaches, syncope, orthopnea, or PND. ? ?In 08/2021 he had a brain MRI that revealed an interval decrease in size of his previously noted pituitary lesion. Recent study reports that it appeared smaller on testing. ? ?Past Medical History:  ?Diagnosis Date  ? Arthritis   ? hands   ? Hypothyroidism   ? ? ?Past Surgical History:  ?Procedure Laterality Date  ? IR ANGIO INTRA EXTRACRAN SEL COM CAROTID INNOMINATE BILAT MOD SED  06/15/2020  ? IR ANGIO VERTEBRAL SEL VERTEBRAL BILAT MOD SED  06/15/2020  ? RADIOLOGY WITH ANESTHESIA N/A 06/15/2020  ? Procedure: IR WITH ANESTHESIA;  Surgeon: Radiologist, Medication, MD;  [place]  Service: Radiology;  Laterality: N/A;  ? ROBOT ASSISTED LAPAROSCOPIC RADICAL PROSTATECTOMY N/A 12/24/2012  ? Procedure: ROBOTIC ASSISTED LAPAROSCOPIC RADICAL PROSTATECTOMY;  Surgeon: DBernestine Amass MD;  [facility];  Service: Urology;  Laterality: N/A;  ? ? ?Current Medications: ?Current Meds  ?Medication Sig  ? acetaminophen (TYLENOL) 325 MG tablet Take 325 mg by mouth every 6 (six) hours as needed for mild pain, fever or headache.  ? apixaban (ELIQUIS) 5 MG TABS tablet Take 1 tablet (5 mg total) by mouth 2 (two) times daily.  ? Cholecalciferol (VITAMIN D3) 125 MCG (5000 UT) CAPS Take 5,000 Units by mouth daily.  ? levothyroxine (SYNTHROID) 100 MCG tablet TAKE ONE TABLET BY MOUTH DAILY  ? predniSONE (DELTASONE) 5  MG tablet TAKE ONE TABLET BY MOUTH DAILY WITH BREAKFAST  ? rosuvastatin (CRESTOR) 20 MG tablet Take 1 tablet (20 mg total) by mouth daily.  ?  ? ?Allergies:   Sulfa antibiotics  ? ?Social History  ? ?Socioeconomic History  ? Marital status: Married  ?  Spouse name: Not on file  ? Number of children: Not on file  ? Years of education: Not on file  ? Highest education level: Not on file  ?Occupational History  ? Not on file  ?Tobacco Use  ? Smoking status: Never  ? Smokeless tobacco: Never  ?Substance and  Sexual Activity  ? Alcohol use: Yes  ?  Comment: rare  ? Drug use: No  ? Sexual activity: Not on file  ?Other Topics Concern  ? Not on file  ?Social History Narrative  ? Not on file  ? ?Social Determinants of Health  ? ?Financial Resource Strain: Not on file  ?Food Insecurity: Not on file  ?Transportation Needs: Not on file  ?Physical Activity: Not on file  ?Stress: Not on file  ?Social Connections: Not on file  ?  ? ?Family History: ?The patient's family history is negative for Thyroid disease. ? ?ROS:   ?Please see the history of present illness.    ?Review of Systems  ?Constitutional:  Negative for chills, fever and malaise/fatigue.  ?HENT:  Negative for hearing loss.   ?Eyes:  Negative for blurred vision and redness.  ?Respiratory:  Negative for shortness of breath.   ?Cardiovascular:  Negative for chest pain, palpitations, orthopnea, claudication, leg swelling and PND.  ?Gastrointestinal:  Negative for blood in stool, melena, nausea and vomiting.  ?Genitourinary:  Negative for dysuria and flank pain.  ?Musculoskeletal:  Negative for falls.  ?Neurological:  Negative for dizziness and loss of consciousness.  ?Endo/Heme/Allergies:  Negative for polydipsia.  ?Psychiatric/Behavioral:  Negative for substance abuse.   ? ?EKGs/Labs/Other Studies Reviewed:   ? ?The following studies were reviewed today: ? ?Echo 06/16/2020: ?Sonographer Comments: Suboptimal parasternal window, suboptimal apical window and echo performed with patient supine and on artificial  ?respirator. Image acquisition challenging due to uncooperative patient.  ?IMPRESSIONS  ? ? 1. Left ventricular ejection fraction, by estimation, is 55 to 60%. The  ?left ventricle has normal function. The left ventricle has no regional  ?wall motion abnormalities. There is mild concentric left ventricular  ?hypertrophy. Left ventricular diastolic  ?parameters are consistent with Grade I diastolic dysfunction (impaired  ?relaxation).  ? 2. Right ventricular systolic  function is normal. The right ventricular  ?size is normal. There is normal pulmonary artery systolic pressure. The  ?estimated right ventricular systolic pressure is 18.8 mmHg.  ? 3. The mitral valve is normal in structure. No evidence of mitral valve  ?regurgitation. No evidence of mitral stenosis. Moderate mitral annular  ?calcification.  ? 4. The aortic valve is normal in structure. Aortic valve regurgitation is  ?not visualized. Mild to moderate aortic valve sclerosis/calcification is  ?present, without any evidence of aortic stenosis.  ? 5. The inferior vena cava is normal in size with greater than 50%  ?respiratory variability, suggesting right atrial pressure of 3 mmHg.  ? ?Conclusion(s)/Recommendation(s): No intracardiac source of embolism  ?detected on this transthoracic study. A transesophageal echocardiogram is recommended to exclude cardiac source of embolism if clinically indicated.  ? ?CT Chest 06/16/2020: ?FINDINGS: ?CT CHEST FINDINGS ?  ?Cardiovascular: UPPER limits normal heart size noted. Coronary ?artery atherosclerotic calcification involving the LEFT main and LAD ?noted. Mild thoracic aortic atherosclerotic  calcifications noted ?without aneurysm. No pericardial effusion. ?  ?Mediastinum/Nodes: No enlarged mediastinal, hilar, or axillary lymph ?nodes. Thyroid gland, trachea, and esophagus demonstrate no ?significant findings. ?  ?Lungs/Pleura: Mild to moderate atelectasis in both lung bases and in ?the SUPERIOR segment LEFT LOWER lobe noted. ?  ?There is no evidence of airspace disease, consolidation, mass, ?suspicious nodule, pleural effusion or pneumothorax. ?  ?Musculoskeletal: No acute or suspicious bony abnormalities are ?noted. ?  ?CT ABDOMEN PELVIS FINDINGS ?  ?Hepatobiliary: The liver and gallbladder are unremarkable. No ?biliary dilatation. ?  ?Pancreas: Unremarkable ?  ?Spleen: Unremarkable ?  ?Adrenals/Urinary Tract: The kidneys, adrenal glands and bladder are ?unremarkable. ?   ?Stomach/Bowel: Stomach is within normal limits. Appendix appears ?normal. No evidence of bowel wall thickening, distention, or ?inflammatory changes. ?  ?Vascular/Lymphatic: Aortic atherosclerosis. No enlarged abdomin

## 2021-10-11 NOTE — Patient Instructions (Signed)
Medication Instructions:   Your physician recommends that you continue on your current medications as directed. Please refer to the Current Medication list given to you today.  *If you need a refill on your cardiac medications before your next appointment, please call your pharmacy*   Lab Work:  TODAY--LIPIDS  If you have labs (blood work) drawn today and your tests are completely normal, you will receive your results only by: MyChart Message (if you have MyChart) OR A paper copy in the mail If you have any lab test that is abnormal or we need to change your treatment, we will call you to review the results.   Follow-Up: At [facility], you and your health needs are our priority.  As part of our continuing mission to provide you with exceptional heart care, we have created designated Provider Care Teams.  These Care Teams include your primary Cardiologist (physician) and Advanced Practice Providers (APPs -  Physician Assistants and Nurse Practitioners) who all work together to provide you with the care you need, when you need it.  We recommend signing up for the patient portal called "MyChart".  Sign up information is provided on this After Visit Summary.  MyChart is used to connect with patients for Virtual Visits (Telemedicine).  Patients are able to view lab/test results, encounter notes, upcoming appointments, etc.  Non-urgent messages can be sent to your provider as well.   To learn more about what you can do with MyChart, go to [email or web address].    Your next appointment:   6 month(s)  The format for your next appointment:   In Person  Provider:   Heather E Pemberton, MD {   Important Information About Sugar       

## 2021-10-16 ENCOUNTER — Telehealth: Payer: Self-pay

## 2021-10-16 NOTE — Telephone Encounter (Signed)
Previous Jermaine Soto patient and he is scheduled to see you on 6/6 and has lab appt on 6/1 please put labs in that you want to be drawn. ?

## 2021-10-17 ENCOUNTER — Ambulatory Visit: Payer: Medicare Other | Admitting: Endocrinology

## 2021-10-17 DIAGNOSIS — Z20822 Contact with and (suspected) exposure to covid-19: Secondary | ICD-10-CM | POA: Diagnosis not present

## 2021-10-26 ENCOUNTER — Other Ambulatory Visit: Payer: Self-pay | Admitting: Endocrinology

## 2021-10-26 DIAGNOSIS — E042 Nontoxic multinodular goiter: Secondary | ICD-10-CM

## 2021-11-09 ENCOUNTER — Other Ambulatory Visit (INDEPENDENT_AMBULATORY_CARE_PROVIDER_SITE_OTHER): Payer: Medicare Other

## 2021-11-09 DIAGNOSIS — E042 Nontoxic multinodular goiter: Secondary | ICD-10-CM | POA: Diagnosis not present

## 2021-11-09 LAB — T4, FREE: Free T4: 1.13 ng/dL (ref 0.60–1.60)

## 2021-11-09 LAB — TSH: TSH: 0.04 u[IU]/mL — ABNORMAL LOW (ref 0.35–5.50)

## 2021-11-14 ENCOUNTER — Ambulatory Visit (INDEPENDENT_AMBULATORY_CARE_PROVIDER_SITE_OTHER): Payer: Medicare Other | Admitting: Endocrinology

## 2021-11-14 ENCOUNTER — Other Ambulatory Visit: Payer: Self-pay | Admitting: Endocrinology

## 2021-11-14 ENCOUNTER — Encounter: Payer: Self-pay | Admitting: Endocrinology

## 2021-11-14 VITALS — BP 132/82 | HR 65 | Ht 70.0 in | Wt 234.0 lb

## 2021-11-14 DIAGNOSIS — I2510 Atherosclerotic heart disease of native coronary artery without angina pectoris: Secondary | ICD-10-CM | POA: Diagnosis not present

## 2021-11-14 DIAGNOSIS — E042 Nontoxic multinodular goiter: Secondary | ICD-10-CM

## 2021-11-14 DIAGNOSIS — I2584 Coronary atherosclerosis due to calcified coronary lesion: Secondary | ICD-10-CM

## 2021-11-14 DIAGNOSIS — E230 Hypopituitarism: Secondary | ICD-10-CM | POA: Diagnosis not present

## 2021-11-14 DIAGNOSIS — E2749 Other adrenocortical insufficiency: Secondary | ICD-10-CM | POA: Diagnosis not present

## 2021-11-14 LAB — BASIC METABOLIC PANEL
BUN: 20 mg/dL (ref 6–23)
CO2: 25 mEq/L (ref 19–32)
Calcium: 9.4 mg/dL (ref 8.4–10.5)
Chloride: 103 mEq/L (ref 96–112)
Creatinine, Ser: 0.91 mg/dL (ref 0.40–1.50)
GFR: 82.21 mL/min (ref >60.00)
Glucose, Bld: 137 mg/dL — ABNORMAL HIGH (ref 70–99)
Potassium: 3.8 mEq/L (ref 3.5–5.1)
Sodium: 136 mEq/L (ref 135–145)

## 2021-11-14 LAB — TESTOSTERONE: Testosterone: 59.66 ng/dL — ABNORMAL LOW (ref 300.00–890.00)

## 2021-11-14 MED ORDER — TESTOSTERONE 30 MG/ACT TD SOLN: TRANSDERMAL | 2 refills | Status: DC

## 2021-11-14 NOTE — Progress Notes (Signed)
Please call to let patient know that the testosterone level is very low, Axiron testosterone supplement to be applied underarm 1 dose on each side has been prescribed, to follow-up in 2 months as scheduled

## 2021-11-14 NOTE — Progress Notes (Signed)
Patient ID: Jermaine Soto, male   DOB: Apr 04, 1946, 76 y.o.   MRN: [id number]   Reason for Appointment: HYPOPITUITARISM, followup visit    History of Present Illness:   HYPOTHYROIDISM was first diagnosed in 2016   The patient has been treated withhe has been on synthroid since 2016, for hypothyroidism Prior to endocrinology consultation he was being treated by PCP with low doses of levothyroxine between 50 and 75 mcg The dose had been reduced to only 37.5 mcg since 03/2020  This was subsequently increased when the diagnosis of SECONDARY hypothyroidism was made during his hospitalization in 1/22  In January 2022 he was admitted with symptoms of a stroke and sodium of 121 Although he was not showing any hypotension he had significantly low cortisol levels Subsequently has been taking only 5 mg prednisone daily   Patient has no complaints of unusual fatigue, cold sensitivity, significant weight change, nausea or decreased appetite He does feel a little sleepy if he is sitting still           The patient is taking the thyroid supplement very regularly in the morning before breakfast.  His dose has been continued unchanged for some time  Lab Results  Component Value Date   TSH 0.04 (L) 11/09/2021   TSH 0.03 (L) 07/10/2021   TSH 0.07 (L) 03/07/2021   FREET4 1.13 11/09/2021   FREET4 0.97 07/10/2021   FREET4 0.82 03/07/2021   TESTOSTERONE deficiency: Although he has had 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 If you have any questions, ask your nurse or doctor.          acetaminophen 325 MG tablet Commonly known as: TYLENOL Take 325 mg by mouth every 6 (six) hours as needed for mild pain, fever or headache.   apixaban 5 MG Tabs tablet Commonly known as: Eliquis Take 1 tablet (5 mg total) by mouth 2 (two) times daily.   levothyroxine 100 MCG tablet Commonly known as: SYNTHROID TAKE ONE TABLET BY MOUTH DAILY   predniSONE 5 MG tablet Commonly known as: DELTASONE TAKE ONE TABLET BY MOUTH DAILY WITH BREAKFAST   rosuvastatin 20 MG tablet Commonly known as: CRESTOR Take 1 tablet (20 mg total) by mouth daily.   Vitamin D3 125 MCG (5000 UT) Caps Take 5,000 Units by mouth daily.        Allergies:  Allergies  Allergen Reactions   Sulfa Antibiotics     Unknown    Past Medical History:  Diagnosis Date   Arthritis    hands    Hypothyroidism     Past Surgical History:  Procedure Laterality Date   IR ANGIO INTRA EXTRACRAN SEL COM CAROTID INNOMINATE BILAT MOD SED  06/15/2020   IR ANGIO VERTEBRAL  SEL VERTEBRAL BILAT MOD SED  06/15/2020   RADIOLOGY WITH ANESTHESIA N/A 06/15/2020   Procedure: IR WITH ANESTHESIA;  Surgeon: Radiologist, Medication, MD;  Location: [facility];  Service: Radiology;  Laterality: N/A;   ROBOT ASSISTED LAPAROSCOPIC RADICAL PROSTATECTOMY N/A 12/24/2012   Procedure: ROBOTIC ASSISTED LAPAROSCOPIC RADICAL PROSTATECTOMY;  Surgeon: Bernestine Amass, MD;  Location: [facility];  Service: Urology;  Laterality: N/A;    Family History  Problem Relation Age of Onset   Thyroid disease Neg Hx     Social History:  reports that he has never smoked. He has never used smokeless tobacco. He reports current alcohol use. He reports that he does not use drugs.  Review of Systems  He has a small multinodular  goiter  Conclusion of ultrasound on 07/17/2021 shows  Nodule 3 (TI-RADS 4), now measures 1.4 x 1.4 x 1.1 cm compared to 1.2 x 1.0 x 0.9 cm on the prior exam. It is located in the inferior right thyroid lobe and meets criteria for imaging follow-up. Annual ultrasound surveillance is recommended until 5 years of stability is documented.    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, adrenal and testosterone  Currently is adequately replaced with 100 mcg of levothyroxine and good levels of T4, TSH is low as expected Subjectively doing well except for some daytime somnolence  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 as he is clinically doing well with 25 mg of prednisone in the morning without any of the symptoms of adrenal insufficiency such as weight loss, nausea He is not fully aware of the diagnosis and this was explained in detail Also does not understand the need for stress doses for acute illness  HYPOGONADISM: He is likely symptomatic with decreased libido but not complaining of any unusual fatigue No follow-up testosterone available since his initial diagnosis in early 2022  History of small multinodular goiter: Largest nodule is only 1.4 cm and can be followed in 1 year with another ultrasound   Treatment:   Continue 100 mcg levothyroxine before breakfast daily.  No change in 5 mg prednisone this is clinically working well Discussed stress doses of at least 15 mg if he has any acute illness for the duration of illness  Recommendations: Check testosterone and sodium today Consider testosterone  replacement therapy if testosterone level is low Discussed various options of topical and injectable testosterone therapies, likely oral formulations will be not covered  Follow-up in 2 months if started on testosterone replacement  Total visit time for evaluation and management of multiple problems, counseling and extensive review of relevant records = 40 minutes  Elayne Snare 11/14/2021, 10:10 AM   Note: This office note was prepared with Dragon voice recognition system technology. Any transcriptional errors that result from this process are unintentional.

## 2021-11-20 ENCOUNTER — Telehealth: Payer: Self-pay

## 2021-11-20 NOTE — Telephone Encounter (Signed)
Patient called in states you were going to send in Rx for Testosterone to [facility] on [address]. Did not see on patient med list

## 2021-11-21 NOTE — Telephone Encounter (Signed)
[facility] Patient notified

## 2021-11-23 ENCOUNTER — Telehealth: Payer: Self-pay | Admitting: Pharmacy Technician

## 2021-11-23 NOTE — Telephone Encounter (Signed)
Received notification from [facility] regarding a prior authorization for  Testosterone . Authorization has been APPROVED from 06/11/21 to 11/21/22.   Phone # [phone]

## 2022-01-25 ENCOUNTER — Telehealth: Payer: Self-pay | Admitting: Family

## 2022-01-25 NOTE — Telephone Encounter (Signed)
Currently corresponding with PT via Mychart message. PT was under the care of Jodi Mourning and was unaware of her moving to the [facility]. PT is now wanting to switch over to Dr.Crawford if possible as he does not want to make that drive and stated he had seen Dr.Crawford before. I had informed him that he would need to be seen as a new patient and before going through the process of transferring care, I would need an okay from both providers.

## 2022-01-29 NOTE — Telephone Encounter (Signed)
Ok for TOC. Will need to keep care with current provider until [facility] visit.

## 2022-02-07 ENCOUNTER — Ambulatory Visit (INDEPENDENT_AMBULATORY_CARE_PROVIDER_SITE_OTHER): Payer: Medicare Other | Admitting: Endocrinology

## 2022-02-07 ENCOUNTER — Encounter: Payer: Self-pay | Admitting: Endocrinology

## 2022-02-07 VITALS — BP 122/80 | HR 65 | Ht 70.0 in | Wt 230.8 lb

## 2022-02-07 DIAGNOSIS — I2510 Atherosclerotic heart disease of native coronary artery without angina pectoris: Secondary | ICD-10-CM | POA: Diagnosis not present

## 2022-02-07 DIAGNOSIS — I2584 Coronary atherosclerosis due to calcified coronary lesion: Secondary | ICD-10-CM | POA: Diagnosis not present

## 2022-02-07 DIAGNOSIS — E042 Nontoxic multinodular goiter: Secondary | ICD-10-CM | POA: Diagnosis not present

## 2022-02-07 DIAGNOSIS — E230 Hypopituitarism: Secondary | ICD-10-CM | POA: Diagnosis not present

## 2022-02-07 LAB — TESTOSTERONE: Testosterone: 386.5 ng/dL (ref 300.00–890.00)

## 2022-02-07 LAB — BASIC METABOLIC PANEL
BUN: 21 mg/dL (ref 6–23)
CO2: 27 mEq/L (ref 19–32)
Calcium: 9.8 mg/dL (ref 8.4–10.5)
Chloride: 103 mEq/L (ref 96–112)
Creatinine, Ser: 1.25 mg/dL (ref 0.40–1.50)
GFR: 56.08 mL/min — ABNORMAL LOW (ref >60.00)
Glucose, Bld: 122 mg/dL — ABNORMAL HIGH (ref 70–99)
Potassium: 4.2 mEq/L (ref 3.5–5.1)
Sodium: 140 mEq/L (ref 135–145)

## 2022-02-07 NOTE — Progress Notes (Signed)
Patient ID: Jermaine Soto, male   DOB: 08/21/1945, 76 y.o.   MRN: [id number]   Reason for Appointment: HYPOPITUITARISM, followup visit    History of Present Illness:   HYPOTHYROIDISM was first diagnosed in 2016   The patient has been treated with synthroid since 2016, for hypothyroidism Prior to endocrinology consultation he was being treated by PCP with low doses of levothyroxine between 50 and 75 mcg This was subsequently increased when the diagnosis of SECONDARY hypothyroidism was made during his hospitalization in 1/22  He is currently taking 100 mcg of levothyroxine as a stable dose  Recently has noted unusual fatigue He does feel a little sleepy if he is sitting still           The patient is taking the thyroid supplement very regularly in the morning before breakfast.  His dose has been continued unchanged his last visit  Wt Readings from Last 3 Encounters:  02/07/22 230 lb 12.8 oz (104.7 kg)  11/14/21 234 lb (106.1 kg)  10/11/21 228 lb 6.4 oz (103.6 kg)    Lab Results  Component Value Date   TSH 0.04 (L) 11/09/2021   TSH 0.03 (L) 07/10/2021   TSH 0.07 (L) 03/07/2021   FREET4 1.13 11/09/2021   FREET4 0.97 07/10/2021   FREET4 0.82 03/07/2021  \  ADRENAL insufficiency:   In January 2022 he was admitted with symptoms of a stroke and sodium of 121 Although he was not showing any hypotension he had significantly low cortisol levels Subsequently has been taking only 5 mg prednisone daily He does not complain of any nausea, lightheadedness decreased appetite Sodium has been consistently normal  TESTOSTERONE deficiency: Although he has had 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 If you have any questions, ask your nurse or doctor.          acetaminophen 325 MG tablet Commonly known as: TYLENOL Take 325 mg by mouth every 6 (six) hours as needed for mild pain, fever or headache.   apixaban 5 MG Tabs tablet Commonly known as: Eliquis Take 1 tablet (5 mg total) by mouth 2 (two) times daily.   levothyroxine 100 MCG tablet Commonly known as: SYNTHROID TAKE ONE TABLET BY MOUTH DAILY   predniSONE 5 MG tablet Commonly known  as: DELTASONE TAKE ONE TABLET BY MOUTH DAILY WITH BREAKFAST   rosuvastatin 20 MG tablet Commonly known as: CRESTOR Take 1 tablet (20 mg total) by mouth daily.   Testosterone 30 MG/ACT Soln Dispense liquid into application cup, apply in the armpits, once on each side   Vitamin D3 125 MCG (5000 UT) Caps Take 5,000 Units by mouth daily.        Allergies:  Allergies  Allergen Reactions   Sulfa Antibiotics     Unknown    Past Medical History:  Diagnosis Date   Arthritis    hands    Hypothyroidism     Past Surgical History:  Procedure Laterality Date   IR ANGIO INTRA EXTRACRAN SEL COM CAROTID INNOMINATE BILAT MOD SED  06/15/2020   IR ANGIO VERTEBRAL SEL VERTEBRAL BILAT MOD SED  06/15/2020    RADIOLOGY WITH ANESTHESIA N/A 06/15/2020   Procedure: IR WITH ANESTHESIA;  Surgeon: Radiologist, Medication, MD;  Location: [facility];  Service: Radiology;  Laterality: N/A;   ROBOT ASSISTED LAPAROSCOPIC RADICAL PROSTATECTOMY N/A 12/24/2012   Procedure: ROBOTIC ASSISTED LAPAROSCOPIC RADICAL PROSTATECTOMY;  Surgeon: Bernestine Amass, MD;  Location: [facility];  Service: Urology;  Laterality: N/A;    Family History  Problem Relation Age of Onset   Thyroid disease Neg Hx     Social History:  reports that he has never smoked. He has never used smokeless tobacco. He reports current alcohol use. He reports that he does not use drugs.  Review of Systems  He has a small multinodular goiter  Conclusion of ultrasound on 07/17/2021 shows  Nodule 3 (TI-RADS 4), now measures 1.4 x 1.4 x 1.1 cm compared to 1.2 x 1.0 x 0.9 cm on the prior exam. It is located in the inferior right thyroid lobe and meets criteria for imaging follow-up. Annual ultrasound surveillance is recommended until 5 years of stability is documented.    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 as he is clinically doing well with only 5 mg of prednisone in the morning  Discussed symptoms of adrenal insufficiency such as weight loss, nausea, lightheadedness  No orthostatic lightheadedness Blood pressure is normal  HYPOGONADISM: He has a significant hypogonadism Has been on treatment for up to about 8 weeks now and appears to have little more energy and muscle mass He has side effects of nipple tenderness likely from not having adequate testosterone levels for several years     Treatment:   No change in 5 mg prednisone this is clinically working well Discussed stress doses of around 15 mg if he has any acute illness for the duration of illness  Recommendations: Check testosterone and sodium today Recheck thyroid in the next visit  Elayne Snare 02/07/2022, 9:02 AM   Note: This office note was prepared with Dragon voice recognition system technology. Any transcriptional errors that result from this process are unintentional.

## 2022-02-09 ENCOUNTER — Other Ambulatory Visit: Payer: Self-pay

## 2022-02-09 MED ORDER — ROSUVASTATIN CALCIUM 20 MG PO TABS: 20.0000 mg | ORAL_TABLET | Freq: Every day | ORAL | 2 refills | Status: DC

## 2022-03-01 DIAGNOSIS — H35372 Puckering of macula, left eye: Secondary | ICD-10-CM | POA: Diagnosis not present

## 2022-03-01 DIAGNOSIS — Z961 Presence of intraocular lens: Secondary | ICD-10-CM | POA: Diagnosis not present

## 2022-03-12 ENCOUNTER — Other Ambulatory Visit: Payer: Self-pay

## 2022-03-12 ENCOUNTER — Ambulatory Visit (INDEPENDENT_AMBULATORY_CARE_PROVIDER_SITE_OTHER): Payer: Medicare Other | Admitting: Ophthalmology

## 2022-03-12 ENCOUNTER — Encounter (INDEPENDENT_AMBULATORY_CARE_PROVIDER_SITE_OTHER): Payer: Self-pay | Admitting: Ophthalmology

## 2022-03-12 DIAGNOSIS — H353132 Nonexudative age-related macular degeneration, bilateral, intermediate dry stage: Secondary | ICD-10-CM | POA: Diagnosis not present

## 2022-03-12 DIAGNOSIS — Z961 Presence of intraocular lens: Secondary | ICD-10-CM

## 2022-03-12 DIAGNOSIS — R0683 Snoring: Secondary | ICD-10-CM | POA: Diagnosis not present

## 2022-03-12 DIAGNOSIS — I2584 Coronary atherosclerosis due to calcified coronary lesion: Secondary | ICD-10-CM | POA: Diagnosis not present

## 2022-03-12 DIAGNOSIS — H35372 Puckering of macula, left eye: Secondary | ICD-10-CM

## 2022-03-12 DIAGNOSIS — I2510 Atherosclerotic heart disease of native coronary artery without angina pectoris: Secondary | ICD-10-CM

## 2022-03-12 DIAGNOSIS — I4891 Unspecified atrial fibrillation: Secondary | ICD-10-CM

## 2022-03-12 MED ORDER — APIXABAN 5 MG PO TABS: 5.0000 mg | ORAL_TABLET | Freq: Two times a day (BID) | ORAL | 1 refills | Status: DC

## 2022-03-12 NOTE — Patient Instructions (Signed)
1.  Report new onset distortions in either eye  2.  Recommend home sleep study evaluation  with arrangements through his primary care physician and/or specialist

## 2022-03-12 NOTE — Telephone Encounter (Signed)
Eliquis '5mg'$  refill request received. Patient is 76 years old, weight-104.7kg, Crea-1.25 on 02/07/2022, Diagnosis-Afib, and last seen by Dr. Johney Frame on 10/11/2021. Dose is appropriate based on dosing criteria. Will send in refill to requested pharmacy.

## 2022-03-12 NOTE — Assessment & Plan Note (Signed)
Review of systems positive with daytime napping on occasion as well.  For sleep apnea.  We will recommend formal study for home sleep study to look for signs of nightly hypoxia which I have informed the patient with my experience and also with results that nighttime hypoxia from untreated or undiagnosed sleep apnea can make macular degenerative disease much worse not to mention the effects on CNS and cardiovascular systems.  Patient indicated understanding I encouraged the patient to seek general medical evaluation regarding potential home sleep study.

## 2022-03-12 NOTE — Assessment & Plan Note (Signed)
Stable OU 

## 2022-03-12 NOTE — Assessment & Plan Note (Signed)

## 2022-03-12 NOTE — Progress Notes (Signed)
03/12/2022     CHIEF COMPLAINT Patient presents for  Chief Complaint  Patient presents with   Macular Degeneration    OS with recent blurring of vision.  HISTORY OF PRESENT ILLNESS: Jermaine Soto is a 76 y.o. male who presents to the clinic today for:   HPI   Recent finding with Dr. Gershon Crane possible macular degenerative change progression Last edited by Hurman Horn, MD on 03/12/2022  8:57 AM.      Referring physician: Rutherford Guys, [place],  [place] [address]  HISTORICAL INFORMATION:   Selected notes from the MEDICAL RECORD NUMBER    Lab Results  Component Value Date   HGBA1C 5.7 (H) 06/16/2020     CURRENT MEDICATIONS: No current outpatient medications on file. (Ophthalmic Drugs)   No current facility-administered medications for this visit. (Ophthalmic Drugs)   Current Outpatient Medications (Other)  Medication Sig   acetaminophen (TYLENOL) 325 MG tablet Take 325 mg by mouth every 6 (six) hours as needed for mild pain, fever or headache.   apixaban (ELIQUIS) 5 MG TABS tablet Take 1 tablet (5 mg total) by mouth 2 (two) times daily.   Cholecalciferol (VITAMIN D3) 125 MCG (5000 UT) CAPS Take 5,000 Units by mouth daily.   levothyroxine (SYNTHROID) 100 MCG tablet TAKE ONE TABLET BY MOUTH DAILY   predniSONE (DELTASONE) 5 MG tablet TAKE ONE TABLET BY MOUTH DAILY WITH BREAKFAST   rosuvastatin (CRESTOR) 20 MG tablet Take 1 tablet (20 mg total) by mouth daily.   Testosterone 30 MG/ACT SOLN Dispense liquid into application cup, apply in the armpits, once on each side   No current facility-administered medications for this visit. (Other)      REVIEW OF SYSTEMS: ROS   Negative for: Constitutional, Gastrointestinal, Neurological, Skin, Genitourinary, Musculoskeletal, HENT, Endocrine, Cardiovascular, Eyes, Respiratory, Psychiatric, Allergic/Imm, Heme/Lymph Last edited by Hurman Horn, MD on 03/12/2022  8:21 AM.       ALLERGIES Allergies   Allergen Reactions   Sulfa Antibiotics     Unknown    PAST MEDICAL HISTORY Past Medical History:  Diagnosis Date   Arthritis    hands    Hypothyroidism    Past Surgical History:  Procedure Laterality Date   IR ANGIO INTRA EXTRACRAN SEL COM CAROTID INNOMINATE BILAT MOD SED  06/15/2020   IR ANGIO VERTEBRAL SEL VERTEBRAL BILAT MOD SED  06/15/2020   RADIOLOGY WITH ANESTHESIA N/A 06/15/2020   Procedure: IR WITH ANESTHESIA;  Surgeon: Radiologist, Medication, MD;  Location: [facility];  Service: Radiology;  Laterality: N/A;   ROBOT ASSISTED LAPAROSCOPIC RADICAL PROSTATECTOMY N/A 12/24/2012   Procedure: ROBOTIC ASSISTED LAPAROSCOPIC RADICAL PROSTATECTOMY;  Surgeon: Bernestine Amass, MD;  Location: [facility];  Service: Urology;  Laterality: N/A;    FAMILY HISTORY Family History  Problem Relation Age of Onset   Thyroid disease Neg Hx     SOCIAL HISTORY Social History   Tobacco Use   Smoking status: Never   Smokeless tobacco: Never  Substance Use Topics   Alcohol use: Yes    Comment: rare   Drug use: No         OPHTHALMIC EXAM:  Base Eye Exam     Visual Acuity (ETDRS)       Right Left   Dist [place] 20/15 20/30   Dist ph [place]  NI         Tonometry (Tonopen, 8:24 AM)       Right Left   Pressure 12 10  Pupils       Pupils APD   Right PERRL None   Left PERRL          Visual Fields       Left Right    Full Full           Slit Lamp and Fundus Exam     External Exam       Right Left   External Normal Normal         Slit Lamp Exam       Right Left   Lids/Lashes Normal Normal   Conjunctiva/Sclera White and quiet White and quiet   Cornea Clear Clear   Anterior Chamber Deep and quiet Deep and quiet   Iris Round and reactive Round and reactive   Lens Centered posterior chamber intraocular lens Centered posterior chamber intraocular lens   Anterior Vitreous Normal Normal         Fundus Exam       Right Left   Posterior Vitreous Normal Normal    Disc Normal Normal   C/D Ratio 0.45 0.5   Macula Early age related macular degeneration, no atrophy Subfoveal drusenoid deposit with pigmentary change of small PED.  But no active fluid, Intermediate age related macular degeneration, no atrophy   Vessels Normal Normal   Periphery Normal Normal            IMAGING AND PROCEDURES  Imaging and Procedures for 03/12/22  OCT, Retina - OU - Both Eyes       Right Eye Quality was good. Scan locations included subfoveal. Central Foveal Thickness: 309. Progression has been stable. Findings include normal foveal contour, retinal drusen .   Left Eye Quality was good. Scan locations included subfoveal. Central Foveal Thickness: 400. Progression has been stable. Findings include abnormal foveal contour, retinal drusen , epiretinal membrane.   Notes OD no active maculopathy minor hard drusen.  OS with large subfoveal drusenoid deposit.  Overall epiretinal membrane inferior to FAZ is stable over time     Color Fundus Photography Optos - OU - Both Eyes       Right Eye Progression has been stable. Macula : drusen. Vessels : normal observations. Periphery : normal observations.   Left Eye Progression has been stable. Disc findings include normal observations. Macula : drusen, epiretinal membrane.              ASSESSMENT/PLAN:  Snores Review of systems positive with daytime napping on occasion as well.  For sleep apnea.  We will recommend formal study for home sleep study to look for signs of nightly hypoxia which I have informed the patient with my experience and also with results that nighttime hypoxia from untreated or undiagnosed sleep apnea can make macular degenerative disease much worse not to mention the effects on CNS and cardiovascular systems.  Patient indicated understanding I encouraged the patient to seek general medical evaluation regarding potential home sleep study.  Intermediate stage nonexudative age-related macular  degeneration of both eyes The nature of age--related macular degeneration was discussed with the patient as well as the distinction between dry and wet types. Checking an Amsler Grid daily with advice to return immediately should a distortion develop, was given to the patient. The patient 's smoking status now and in the past was determined and advice based on the AREDS study was provided regarding the consumption of antioxidant supplements. AREDS 2 vitamin formulation was recommended. Consumption of dark leafy vegetables and fresh fruits of various colors was  recommended. Treatment modalities for wet macular degeneration particularly the use of intravitreal injections of anti-blood vessel growth factors was discussed with the patient. Avastin, Lucentis, and Eylea are the available options. On occasion, therapy includes the use of photodynamic therapy and thermal laser. Stressed to the patient do not rub eyes.  Patient was advised to check Amsler Grid daily and return immediately if changes are noted. Instructions on using the grid were given to the patient. All patient questions were answered.  Pseudophakia of both eyes Stable OU     ICD-10-CM   1. Epiretinal membrane, left eye  H35.372 OCT, Retina - OU - Both Eyes    Color Fundus Photography Optos - OU - Both Eyes    2. Intermediate stage nonexudative age-related macular degeneration of both eyes  H35.3132 OCT, Retina - OU - Both Eyes    Color Fundus Photography Optos - OU - Both Eyes    3. Snores  R06.83     4. Pseudophakia of both eyes  Z96.1       1.  OS with new progression of intermediate ARMD.  Subfoveal large drusenoid deposit.  I discussed with patient there is no specific therapy for this other than using AREDS 2 formulation which may slow progression.  Nonetheless have concerns due to body habitus as well as review of systems patient may have undiagnosed and/or untreated sleep apnea.  The nightly hypoxic damage that stresses the CNS,  cardiovascular systems, also stressed the subfoveal macular region as it requires more oxygen in the portion of the human anatomy.  For this reason I have encouraged patient to seek evaluation via his primary care physicians and/or specialist for consideration of night sleep study potentially at home sleep study to look for signs of hypoxic disease from untreated sleep apnea that might be treatable and enhance his quality of living if treatment required and performed  2.  Patient to report promptly new onset visual acuity disturbances or declines  3.  Ophthalmic Meds Ordered this visit:  No orders of the defined types were placed in this encounter.      Return in about 3 months (around 06/12/2022) for DILATE OU, OCT.  Patient Instructions  1.  Report new onset distortions in either eye  2.  Recommend home sleep study evaluation  with arrangements through his primary care physician and/or specialist   Explained the diagnoses, plan, and follow up with the patient and they expressed understanding.  Patient expressed understanding of the importance of proper follow up care.   Clent Demark Rayland Hamed M.D. Diseases & Surgery of the Retina and Vitreous [facility] [place] 03/12/22     Abbreviations: M myopia (nearsighted); A astigmatism; H hyperopia (farsighted); P presbyopia; Mrx spectacle prescription;  CTL contact lenses; OD right eye; OS left eye; OU both eyes  XT exotropia; ET esotropia; PEK punctate epithelial keratitis; PEE punctate epithelial erosions; DES dry eye syndrome; MGD meibomian gland dysfunction; ATs artificial tears; PFAT's preservative free artificial tears; [place] nuclear sclerotic cataract; PSC posterior subcapsular cataract; ERM epi-retinal membrane; PVD posterior vitreous detachment; RD retinal detachment; DM diabetes mellitus; DR diabetic retinopathy; NPDR non-proliferative diabetic retinopathy; PDR proliferative diabetic retinopathy; CSME clinically significant macular  edema; DME diabetic macular edema; dbh dot blot hemorrhages; CWS cotton wool spot; POAG primary open angle glaucoma; C/D cup-to-disc ratio; HVF humphrey visual field; GVF goldmann visual field; OCT optical coherence tomography; IOP intraocular pressure; BRVO Branch retinal vein occlusion; CRVO central retinal vein occlusion; CRAO central retinal artery occlusion;  BRAO branch retinal artery occlusion; RT retinal tear; SB scleral buckle; PPV pars plana vitrectomy; VH Vitreous hemorrhage; PRP panretinal laser photocoagulation; IVK intravitreal kenalog; VMT vitreomacular traction; MH Macular hole;  NVD neovascularization of the disc; NVE neovascularization elsewhere; AREDS age related eye disease study; ARMD age related macular degeneration; POAG primary open angle glaucoma; EBMD epithelial/anterior basement membrane dystrophy; ACIOL anterior chamber intraocular lens; IOL intraocular lens; PCIOL posterior chamber intraocular lens; Phaco/IOL phacoemulsification with intraocular lens placement; [place] photorefractive keratectomy; LASIK laser assisted in situ keratomileusis; HTN hypertension; DM diabetes mellitus; COPD chronic obstructive pulmonary disease

## 2022-04-10 DIAGNOSIS — I4891 Unspecified atrial fibrillation: Secondary | ICD-10-CM | POA: Diagnosis not present

## 2022-04-10 DIAGNOSIS — E7800 Pure hypercholesterolemia, unspecified: Secondary | ICD-10-CM | POA: Diagnosis not present

## 2022-04-10 DIAGNOSIS — E039 Hypothyroidism, unspecified: Secondary | ICD-10-CM | POA: Diagnosis not present

## 2022-04-10 DIAGNOSIS — Z1211 Encounter for screening for malignant neoplasm of colon: Secondary | ICD-10-CM | POA: Diagnosis not present

## 2022-04-10 DIAGNOSIS — Z6833 Body mass index (BMI) 33.0-33.9, adult: Secondary | ICD-10-CM | POA: Diagnosis not present

## 2022-04-10 DIAGNOSIS — Z23 Encounter for immunization: Secondary | ICD-10-CM | POA: Diagnosis not present

## 2022-04-10 DIAGNOSIS — Z0000 Encounter for general adult medical examination without abnormal findings: Secondary | ICD-10-CM | POA: Diagnosis not present

## 2022-04-10 DIAGNOSIS — R030 Elevated blood-pressure reading, without diagnosis of hypertension: Secondary | ICD-10-CM | POA: Diagnosis not present

## 2022-04-10 DIAGNOSIS — R7303 Prediabetes: Secondary | ICD-10-CM | POA: Diagnosis not present

## 2022-04-17 NOTE — Progress Notes (Unsigned)
Cardiology Office Note:    Date:  04/17/2022   ID:  [name], DOB 14-May-1946, MRN [id number]  PCP:  Marrian Salvage, [facility]  Cardiologist:  Freada Bergeron, MD  Advanced Practice Provider:  No care team member to display Electrophysiologist:  None    Referring MD: Marrian Salvage,*     History of Present Illness:    Jermaine Soto is a 76 y.o. male with a hx of hypothyroidism and Afib who presents to clinic for follow-up.  Patient initially seen on 04/06/20, after he was noted to be in Afib in the office with his PCP. He was started on apixaban for [facility]. He was also notably losing significant amount of weight in the setting of hyperthyroidism (TSH 0.088) from supratherapeutic doses of synthroid. We decreased his synthroid to 37.45mg at that time. TTE showed LVEF 50-55%, no WMA, mild RAE, normal LA, no significant valvular disease.   .[facility] hyponatremia detected on out-patient lab work with Na 125. Thought may be due to poor po intake as glucose normal, TSH improved, no GI symptoms. He was given IVF with improvement. Repeat Na 128.   [facility] left gaze preference and right sided neglect and global aphasia. MRI was negative. He was found to be profoundly hyponatremic and other work-up including MRI brain, CT angiogram and EEG negative. Urine studies consistent with SIADH. Put on 2L restriction with improvement of symptoms. Discharged home on 06/20/20.   Seen in clinic on 10/2021 where he was doing very well. In 08/2021 he had a brain MRI that revealed an interval decrease in size of his previously noted pituitary lesion. Recent study reports that it appeared smaller on testing.  Today, ***  Past Medical History:  Diagnosis Date   Arthritis    hands    Hypothyroidism     Past Surgical History:  Procedure Laterality Date   IR ANGIO INTRA EXTRACRAN SEL COM CAROTID  INNOMINATE BILAT MOD SED  06/15/2020   IR ANGIO VERTEBRAL SEL VERTEBRAL BILAT MOD SED  06/15/2020   RADIOLOGY WITH ANESTHESIA N/A 06/15/2020   Procedure: IR WITH ANESTHESIA;  Surgeon: Radiologist, Medication, MD;  [place]  Service: Radiology;  Laterality: N/A;   ROBOT ASSISTED LAPAROSCOPIC RADICAL PROSTATECTOMY N/A 12/24/2012   Procedure: ROBOTIC ASSISTED LAPAROSCOPIC RADICAL PROSTATECTOMY;  Surgeon: DBernestine Amass MD;  Location: [facility];  Service: Urology;  Laterality: N/A;    Current Medications: No outpatient medications have been marked as taking for the 04/19/22 encounter (Appointment) with PFreada Bergeron MD.     Allergies:   Sulfa antibiotics   Social History   Socioeconomic History   Marital status: Married    Spouse name: Not on file   Number of children: Not on file   Years of education: Not on file   Highest education level: Not on file  Occupational History   Not on file  Tobacco Use   Smoking status: Never   Smokeless tobacco: Never  Substance and Sexual Activity   Alcohol use: Yes    Comment: rare   Drug use: No   Sexual activity: Not on file  Other Topics Concern   Not on file  Social History Narrative   Not on file   Social Determinants of Health   Financial Resource Strain: Not on file  Food Insecurity: Not on file  Transportation Needs: Not on file  Physical  Activity: Not on file  Stress: Not on file  Social Connections: Not on file     Family History: The patient's family history is negative for Thyroid disease.  ROS:   Please see the history of present illness.    Review of Systems  Constitutional:  Negative for chills, fever and malaise/fatigue.  HENT:  Negative for hearing loss.   Eyes:  Negative for blurred vision and redness.  Respiratory:  Negative for shortness of breath.   Cardiovascular:  Negative for chest pain, palpitations, orthopnea, claudication, leg swelling and PND.  Gastrointestinal:  Negative for blood in stool,  melena, nausea and vomiting.  Genitourinary:  Negative for dysuria and flank pain.  Musculoskeletal:  Negative for falls.  Neurological:  Negative for dizziness and loss of consciousness.  Endo/Heme/Allergies:  Negative for polydipsia.  Psychiatric/Behavioral:  Negative for substance abuse.     EKGs/Labs/Other Studies Reviewed:    The following studies were reviewed today:  Echo 06/16/2020: Sonographer Comments: Suboptimal parasternal window, suboptimal apical window and echo performed with patient supine and on artificial  respirator. Image acquisition challenging due to uncooperative patient.  IMPRESSIONS    1. Left ventricular ejection fraction, by estimation, is 55 to 60%. The  left ventricle has normal function. The left ventricle has no regional  wall motion abnormalities. There is mild concentric left ventricular  hypertrophy. Left ventricular diastolic  parameters are consistent with Grade I diastolic dysfunction (impaired  relaxation).   2. Right ventricular systolic function is normal. The right ventricular  size is normal. There is normal pulmonary artery systolic pressure. The  estimated right ventricular systolic pressure is 74.2 mmHg.   3. The mitral valve is normal in structure. No evidence of mitral valve  regurgitation. No evidence of mitral stenosis. Moderate mitral annular  calcification.   4. The aortic valve is normal in structure. Aortic valve regurgitation is  not visualized. Mild to moderate aortic valve sclerosis/calcification is  present, without any evidence of aortic stenosis.   5. The inferior vena cava is normal in size with greater than 50%  respiratory variability, suggesting right atrial pressure of 3 mmHg.   Conclusion(s)/Recommendation(s): No intracardiac source of embolism  detected on this transthoracic study. A transesophageal echocardiogram is recommended to exclude cardiac source of embolism if clinically indicated.   CT Chest  06/16/2020: FINDINGS: CT CHEST FINDINGS   Cardiovascular: UPPER limits normal heart size noted. Coronary artery atherosclerotic calcification involving the LEFT main and LAD noted. Mild thoracic aortic atherosclerotic calcifications noted without aneurysm. No pericardial effusion.   Mediastinum/Nodes: No enlarged mediastinal, hilar, or axillary lymph nodes. Thyroid gland, trachea, and esophagus demonstrate no significant findings.   Lungs/Pleura: Mild to moderate atelectasis in both lung bases and in the SUPERIOR segment LEFT LOWER lobe noted.   There is no evidence of airspace disease, consolidation, mass, suspicious nodule, pleural effusion or pneumothorax.   Musculoskeletal: No acute or suspicious bony abnormalities are noted.   CT ABDOMEN PELVIS FINDINGS   Hepatobiliary: The liver and gallbladder are unremarkable. No biliary dilatation.   Pancreas: Unremarkable   Spleen: Unremarkable   Adrenals/Urinary Tract: The kidneys, adrenal glands and bladder are unremarkable.   Stomach/Bowel: Stomach is within normal limits. Appendix appears normal. No evidence of bowel wall thickening, distention, or inflammatory changes.   Vascular/Lymphatic: Aortic atherosclerosis. No enlarged abdominal or pelvic lymph nodes.   Reproductive: Prostatectomy.   Other: No ascites, focal collection or pneumoperitoneum.   Musculoskeletal: No acute or suspicious bony abnormalities. Multilevel degenerative disc disease/spondylosis the  lumbar spine identified, moderate at L4-5.   IMPRESSION: 1. No evidence of primary malignancy or metastatic disease. 2. Mild to moderate bibasilar and SUPERIOR segment LEFT LOWER lobe atelectasis. 3. Coronary artery disease. 4. Aortic Atherosclerosis (ICD10-I70.0).  TTE 04/28/20: IMPRESSIONS   1. Left ventricular ejection fraction, by estimation, is 50 to 55%. The  left ventricle has low normal function. The left ventricle has no regional  wall motion  abnormalities. Left ventricular diastolic function could not  be evaluated.   2. Right ventricular systolic function is normal. The right ventricular  size is normal. There is normal pulmonary artery systolic pressure.   3. Right atrial size was mildly dilated.   4. The mitral valve is normal in structure. Mild mitral valve  regurgitation. No evidence of mitral stenosis.   5. The aortic valve is normal in structure. Aortic valve regurgitation is  not visualized. No aortic stenosis is present.    CT Chest 06/17/19: FINDINGS: CT CHEST FINDINGS   Cardiovascular: UPPER limits normal heart size noted. Coronary artery atherosclerotic calcification involving the LEFT main and LAD noted. Mild thoracic aortic atherosclerotic calcifications noted without aneurysm. No pericardial effusion.   Mediastinum/Nodes: No enlarged mediastinal, hilar, or axillary lymph nodes. Thyroid gland, trachea, and esophagus demonstrate no significant findings.   Lungs/Pleura: Mild to moderate atelectasis in both lung bases and in the SUPERIOR segment LEFT LOWER lobe noted.   There is no evidence of airspace disease, consolidation, mass, suspicious nodule, pleural effusion or pneumothorax.   Musculoskeletal: No acute or suspicious bony abnormalities are noted.   CT ABDOMEN PELVIS FINDINGS   Hepatobiliary: The liver and gallbladder are unremarkable. No biliary dilatation.   Pancreas: Unremarkable   Spleen: Unremarkable   Adrenals/Urinary Tract: The kidneys, adrenal glands and bladder are unremarkable.   Stomach/Bowel: Stomach is within normal limits. Appendix appears normal. No evidence of bowel wall thickening, distention, or inflammatory changes.   Vascular/Lymphatic: Aortic atherosclerosis. No enlarged abdominal or pelvic lymph nodes.   Reproductive: Prostatectomy.   Other: No ascites, focal collection or pneumoperitoneum.   Musculoskeletal: No acute or suspicious bony  abnormalities. 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   IMPRESSION: 1. No evidence of primary malignancy or metastatic disease. 2. Mild to moderate bibasilar and SUPERIOR segment LEFT LOWER lobe atelectasis. 3. Coronary artery disease. 4. Aortic Atherosclerosis (ICD10-I70.0).   MRI head 06/16/20: FINDINGS: Brain: Examination mildly degraded by motion artifact.   Cerebral volume within normal limits. Mild chronic microvascular ischemic disease noted involving the periventricular white matter. No abnormal foci of restricted diffusion to suggest acute or subacute ischemia. Gray-white matter differentiation maintained. No encephalomalacia to suggest chronic cortical infarction. No evidence for acute or chronic intracranial hemorrhage.   1.4 x 1.1 x 1.2 cm well-circumscribed cystic lesion seen within the pituitary gland, indeterminate, but 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 Suprasellar extension without significant mass effect on the optic chiasm.   No other mass lesion, midline shift or mass effect. No hydrocephalus or extra-axial fluid collection.   Vascular: Major intracranial vascular flow voids are well maintained.   Skull and upper cervical spine: Craniocervical junction within normal limits. Bone marrow signal intensity normal. No scalp soft tissue abnormality.   Sinuses/Orbits: Patient status post bilateral ocular lens replacement. Globes and orbital soft tissues within normal limits. Fluid seen within the nasopharynx. Patient is intubated. Trace bilateral mastoid effusions noted.   Other: None.   IMPRESSION: 1. No acute intracranial abnormality. 2. Mild chronic microvascular ischemic  disease for age. 3. 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  EKG:  EKG is personally  reviewed. 10/11/2021: Sinus rhythm. Rate 68 bpm. 12/22/2020: no ekg ordered today.  Recent Labs: 11/09/2021: TSH 0.04 02/07/2022: BUN 21; Creatinine, Ser 1.25; Potassium 4.2; Sodium 140   Recent Lipid Panel    Component Value Date/Time   CHOL 135 10/11/2021 0910   TRIG 98 10/11/2021 0910   HDL 69 10/11/2021 0910   CHOLHDL 2.0 10/11/2021 0910   CHOLHDL 3.5 06/16/2020 0344   VLDL 21 06/16/2020 0344   LDLCALC 48 10/11/2021 0910     Risk Assessment/Calculations:    CHA2DS2-VASc Score =    This indicates a  % annual risk of stroke. The patient's score is based upon:      Physical Exam:    VS:  There were no vitals taken for this visit.    Wt Readings from Last 3 Encounters:  02/07/22 230 lb 12.8 oz (104.7 kg)  11/14/21 234 lb (106.1 kg)  10/11/21 228 lb 6.4 oz (103.6 kg)     GEN:  Well nourished, well developed in no acute distress HEENT: Normal NECK: No JVD; No carotid bruits CARDIAC: RRR, no murmurs, rubs, gallops RESPIRATORY:  CTAB, no wheezes ABDOMEN: Soft, non-tender, non-distended MUSCULOSKELETAL:  Warm, no edema SKIN: Warm and dry NEUROLOGIC:  Alert and oriented x 3 PSYCHIATRIC:  Normal affect   ASSESSMENT:    No diagnosis found.    PLAN:    In order of problems listed above:  #Atrial fibrillation: CHADs-vasc 2 for age and coronary/aortic atherosclerosis noted on CT chest. Developed in the setting of elevated thyroid levels. Wanted to proceed with anticoagulation to reduce stroke risk as patient is pre-diabetic and has a positive family history for stroke in the family. Tolerating apixaban well with no bleeding. Not on BB. Back in NSR. Discussed option of loop and trial of AC now that pituitary insufficiency significantly better, but he would like to just continue with current management for now.  -Continue eliquis '5mg'$  BID for anticoagulation -Not on BB as was hypotensive in the setting of Endocrinopathy; BP and HR well controlled. Will continue to monitor  and add back if needed -TTE with LVEF 50-55%, no regional WMA   #Pituitary Insufficiency: #Central hypothyroidism #SIADH #Weight loss: Patient initially presented with unintentional wight loss of 40lbs with labs consistent with severely low TSH. Also with hyponatremia and significant daytime somnolence with concern for underlying Endocrinopathy. Was referred to Dr. Garnette Scheuermann and was diagnosed with pituitary insufficiency. Now on prednisone, synthroid and fluid restriction with remarkable improvement. Energy levels significantly improved. Gained weight. Na well controlled. No recurrent Afib. -Continue follow-up with Endocrine as scheduled -Continue synthroid, prednisone and fluid restriction   #Coronary Calcification: Noted on CT chest. Asymptomatic with no exertional chest pain, SOB or faitgue. LDL at goal of 55 on 10/03/20. -Continue crestor '20mg'$  daily -Check lipids today   #Hyponatremia #AMS, stroke-like symptoms: Patient admitted in early Jan 2022 with left gaze deviation and right sided neglect as well as AMS. MRI head without stroke. CTA without significant vascular disease. Did not received tPA as on systemic AC. EEG without seizures. Thought symptoms driven by hyponatremia with Na 117. Urine studies consistent with SIADH. Currently feeling better with no recurrence of symptoms. On 1.5L fluid restriction. -Follow-up with Endocrinology as scheduled   #Weight loss: Significantly improved. Patient with profound weight loss thought to be driven by iatrogenic  hyperthyroidism and pituitary insufficiency. CT chest/abd/pelvis without evidence of mass.  Now significantly improved with treatment of pituitary insufficiency. -Follow-up with endocrine as above -Fortunately, no evidence of malignancy on CT c/a/p  Follow up in 6 months.  Medication Adjustments/Labs and Tests Ordered: Current medicines are reviewed at length with the patient today.  Concerns regarding medicines are outlined above.    No orders of the defined types were placed in this encounter.  No orders of the defined types were placed in this encounter.  There are no Patient Instructions on file for this visit.   I,Mathew Stumpf,acting as a Education administrator for Freada Bergeron, MD.,have documented all relevant documentation on the behalf of Freada Bergeron, MD,as directed by  Freada Bergeron, MD while in the presence of Freada Bergeron, MD.  I, Freada Bergeron, MD, have reviewed all documentation for this visit. The documentation on 04/17/22 for the exam, diagnosis, procedures, and orders are all accurate and complete.    Signed, Freada Bergeron, MD  04/17/2022 8:38 PM    [place]

## 2022-04-19 ENCOUNTER — Ambulatory Visit: Payer: Medicare Other | Attending: Cardiology | Admitting: Cardiology

## 2022-04-19 ENCOUNTER — Encounter: Payer: Self-pay | Admitting: Cardiology

## 2022-04-19 VITALS — BP 132/82 | HR 71 | Ht 70.0 in | Wt 227.0 lb

## 2022-04-19 DIAGNOSIS — E785 Hyperlipidemia, unspecified: Secondary | ICD-10-CM

## 2022-04-19 DIAGNOSIS — E230 Hypopituitarism: Secondary | ICD-10-CM | POA: Diagnosis not present

## 2022-04-19 DIAGNOSIS — I2510 Atherosclerotic heart disease of native coronary artery without angina pectoris: Secondary | ICD-10-CM | POA: Diagnosis not present

## 2022-04-19 DIAGNOSIS — E871 Hypo-osmolality and hyponatremia: Secondary | ICD-10-CM

## 2022-04-19 DIAGNOSIS — R634 Abnormal weight loss: Secondary | ICD-10-CM | POA: Diagnosis not present

## 2022-04-19 DIAGNOSIS — I2584 Coronary atherosclerosis due to calcified coronary lesion: Secondary | ICD-10-CM | POA: Diagnosis not present

## 2022-04-19 DIAGNOSIS — E079 Disorder of thyroid, unspecified: Secondary | ICD-10-CM | POA: Diagnosis not present

## 2022-04-19 DIAGNOSIS — I4891 Unspecified atrial fibrillation: Secondary | ICD-10-CM

## 2022-04-19 MED ORDER — APIXABAN 5 MG PO TABS: 5.0000 mg | ORAL_TABLET | Freq: Two times a day (BID) | ORAL | 3 refills | Status: DC

## 2022-04-19 NOTE — Patient Instructions (Signed)
Medication Instructions:   Your physician recommends that you continue on your current medications as directed. Please refer to the Current Medication list given to you today.  *If you need a refill on your cardiac medications before your next appointment, please call your pharmacy*   Follow-Up: At [facility], you and your health needs are our priority.  As part of our continuing mission to provide you with exceptional heart care, we have created designated Provider Care Teams.  These Care Teams include your primary Cardiologist (physician) and Advanced Practice Providers (APPs -  Physician Assistants and Nurse Practitioners) who all work together to provide you with the care you need, when you need it.  We recommend signing up for the patient portal called "MyChart".  Sign up information is provided on this After Visit Summary.  MyChart is used to connect with patients for Virtual Visits (Telemedicine).  Patients are able to view lab/test results, encounter notes, upcoming appointments, etc.  Non-urgent messages can be sent to your provider as well.   To learn more about what you can do with MyChart, go to [email or web address].    Your next appointment:   6 month(s)  The format for your next appointment:   In Person  Provider:   Heather E Pemberton, MD     Important Information About Sugar       

## 2022-04-19 NOTE — Progress Notes (Signed)
Cardiology Office Note:    Date:  04/19/2022   ID:  [name], DOB [birth year], MRN [id number]  PCP:  Marrian Salvage, [facility]  Cardiologist:  Freada Bergeron, MD  Advanced Practice Provider:  No care team member to display Electrophysiologist:  None   Referring MD: Marrian Salvage,*    History of Present Illness:    Jermaine Soto is a 76 y.o. male with a hx of hypothyroidism and Afib who presents to clinic for follow-up.  Patient initially seen on 04/06/20, after he was noted to be in Afib in the office with his PCP. He was started on apixaban for [facility]. He was also notably losing significant amount of weight in the setting of hyperthyroidism (TSH 0.088) from supratherapeutic doses of synthroid. We decreased his synthroid to 37.67mg at that time. TTE showed LVEF 50-55%, no WMA, mild RAE, normal LA, no significant valvular disease.   .Marland Kitchen[facility] with hyponatremia detected on out-patient lab work with Na 125. Thought may be due to poor po intake as glucose normal, TSH improved, no GI symptoms. He was given IVF with improvement. Repeat Na 128.   [facility] with left gaze preference and right sided neglect and global aphasia. MRI was negative. He was found to be profoundly hyponatremic and other work-up including MRI brain, CT angiogram and EEG negative. Urine studies consistent with SIADH. Put on 2L restriction with improvement of symptoms. Discharged home on 06/20/20.   Seen in clinic on 10/2021 where he was doing very well. In 08/2021 he had a brain MRI that revealed an interval decrease in size of his previously noted pituitary lesion. Recent study reports that it appeared smaller on testing.  Today, the patient states that he is feeling good without any chest pain, shortness of breath, palpitations, or LE edema. He is staying active and denies anginal symptoms.  He expressed concerns about  being on prednisone long-term. 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  Regarding his diet he states he is eating well. However, he reports that his glucose has been elevated.   Aside from bruising easily he is tolerant of his Eliquis.  He denies any lightheadedness, headaches, syncope, orthopnea, or PND.   Past Medical History:  Diagnosis Date   Arthritis    hands    Hypothyroidism     Past Surgical History:  Procedure Laterality Date   IR ANGIO INTRA EXTRACRAN SEL COM CAROTID INNOMINATE BILAT MOD SED  06/15/2020   IR ANGIO VERTEBRAL SEL VERTEBRAL BILAT MOD SED  06/15/2020   RADIOLOGY WITH ANESTHESIA N/A 06/15/2020   Procedure: IR WITH ANESTHESIA;  Surgeon: Radiologist, Medication, MD;  [place]  Service: Radiology;  Laterality: N/A;   ROBOT ASSISTED LAPAROSCOPIC RADICAL PROSTATECTOMY N/A 12/24/2012   Procedure: ROBOTIC ASSISTED LAPAROSCOPIC RADICAL PROSTATECTOMY;  Surgeon: DBernestine Amass MD;  Location: [facility];  Service: Urology;  Laterality: N/A;    Current Medications: Current Meds  Medication Sig   acetaminophen (TYLENOL) 325 MG tablet Take 325 mg by mouth every 6 (six) hours as needed for mild pain, fever or headache.   Cholecalciferol (VITAMIN D3) 125 MCG (5000 UT) CAPS Take 5,000 Units by mouth daily.   levothyroxine (SYNTHROID) 100 MCG tablet TAKE ONE TABLET BY MOUTH DAILY   predniSONE (DELTASONE) 5 MG tablet TAKE ONE TABLET BY MOUTH DAILY WITH BREAKFAST   rosuvastatin (CRESTOR) 20 MG  tablet Take 1 tablet (20 mg total) by mouth daily.   Testosterone 30 MG/ACT SOLN Dispense liquid into application cup, apply in the armpits, once on each side   [DISCONTINUED] apixaban (ELIQUIS) 5 MG TABS tablet Take 1 tablet (5 mg total) by mouth 2 (two) times daily.     Allergies:   Sulfa antibiotics and Tape   Social History   Socioeconomic History   Marital status: Married    Spouse name: Not on file   Number of children: Not on file    Years of education: Not on file   Highest education level: Not on file  Occupational History   Not on file  Tobacco Use   Smoking status: Never   Smokeless tobacco: Never  Substance and Sexual Activity   Alcohol use: Yes    Comment: rare   Drug use: No   Sexual activity: Not on file  Other Topics Concern   Not on file  Social History Narrative   Not on file   Social Determinants of Health   Financial Resource Strain: Not on file  Food Insecurity: Not on file  Transportation Needs: Not on file  Physical Activity: Not on file  Stress: Not on file  Social Connections: Not on file     Family History: The patient's family history is negative for Thyroid disease.  ROS:   Please see the history of present illness.    Review of Systems  Constitutional:  Negative for chills, fever and malaise/fatigue.  HENT:  Negative for hearing loss.   Eyes:  Negative for blurred vision and redness.  Respiratory:  Negative for shortness of breath.   Cardiovascular:  Negative for chest pain, palpitations, orthopnea, claudication, leg swelling and PND.  Gastrointestinal:  Negative for blood in stool, melena, nausea and vomiting.  Genitourinary:  Negative for dysuria and flank pain.  Musculoskeletal:  Negative for falls.  Neurological:  Negative for dizziness and loss of consciousness.  Endo/Heme/Allergies:  Negative for polydipsia. Bruises/bleeds easily.  Psychiatric/Behavioral:  Negative for substance abuse.     EKGs/Labs/Other Studies Reviewed:    The following studies were reviewed today:  Echo 06/16/2020: Sonographer Comments: Suboptimal parasternal window, suboptimal apical window and echo performed with patient supine and on artificial  respirator. Image acquisition challenging due to uncooperative patient.  IMPRESSIONS    1. Left ventricular ejection fraction, by estimation, is 55 to 60%. The  left ventricle has normal function. The left ventricle has no regional  wall motion  abnormalities. There is mild concentric left ventricular  hypertrophy. Left ventricular diastolic  parameters are consistent with Grade I diastolic dysfunction (impaired  relaxation).   2. Right ventricular systolic function is normal. The right ventricular  size is normal. There is normal pulmonary artery systolic pressure. The  estimated right ventricular systolic pressure is 87.5 mmHg.   3. The mitral valve is normal in structure. No evidence of mitral valve  regurgitation. No evidence of mitral stenosis. Moderate mitral annular  calcification.   4. The aortic valve is normal in structure. Aortic valve regurgitation is  not visualized. Mild to moderate aortic valve sclerosis/calcification is  present, without any evidence of aortic stenosis.   5. The inferior vena cava is normal in size with greater than 50%  respiratory variability, suggesting right atrial pressure of 3 mmHg.   Conclusion(s)/Recommendation(s): No intracardiac source of embolism  detected on this transthoracic study. A transesophageal echocardiogram is recommended to exclude cardiac source of embolism if clinically indicated.   CT Chest  06/16/2020: FINDINGS: CT CHEST FINDINGS   Cardiovascular: UPPER limits normal heart size noted. Coronary artery atherosclerotic calcification involving the LEFT main and LAD noted. Mild thoracic aortic atherosclerotic calcifications noted without aneurysm. No pericardial effusion.   Mediastinum/Nodes: No enlarged mediastinal, hilar, or axillary lymph nodes. Thyroid gland, trachea, and esophagus demonstrate no significant findings.   Lungs/Pleura: Mild to moderate atelectasis in both lung bases and in the SUPERIOR segment LEFT LOWER lobe noted.   There is no evidence of airspace disease, consolidation, mass, suspicious nodule, pleural effusion or pneumothorax.   Musculoskeletal: No acute or suspicious bony abnormalities are noted.   CT ABDOMEN PELVIS FINDINGS    Hepatobiliary: The liver and gallbladder are unremarkable. No biliary dilatation.   Pancreas: Unremarkable   Spleen: Unremarkable   Adrenals/Urinary Tract: The kidneys, adrenal glands and bladder are unremarkable.   Stomach/Bowel: Stomach is within normal limits. Appendix appears normal. No evidence of bowel wall thickening, distention, or inflammatory changes.   Vascular/Lymphatic: Aortic atherosclerosis. No enlarged abdominal or pelvic lymph nodes.   Reproductive: Prostatectomy.   Other: No ascites, focal collection or pneumoperitoneum.   Musculoskeletal: No acute or suspicious bony abnormalities. 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   IMPRESSION: 1. No evidence of primary malignancy or metastatic disease. 2. Mild to moderate bibasilar and SUPERIOR segment LEFT LOWER lobe atelectasis. 3. Coronary artery disease. 4. Aortic Atherosclerosis (ICD10-I70.0).  TTE 04/28/20: IMPRESSIONS   1. Left ventricular ejection fraction, by estimation, is 50 to 55%. The  left ventricle has low normal function. The left ventricle has no regional  wall motion abnormalities. Left ventricular diastolic function could not  be evaluated.   2. Right ventricular systolic function is normal. The right ventricular  size is normal. There is normal pulmonary artery systolic pressure.   3. Right atrial size was mildly dilated.   4. The mitral valve is normal in structure. Mild mitral valve  regurgitation. No evidence of mitral stenosis.   5. The aortic valve is normal in structure. Aortic valve regurgitation is  not visualized. No aortic stenosis is present.    CT Chest 06/17/19: FINDINGS: CT CHEST FINDINGS   Cardiovascular: UPPER limits normal heart size noted. Coronary artery atherosclerotic calcification involving the LEFT main and LAD noted. Mild thoracic aortic atherosclerotic calcifications noted without aneurysm. No pericardial  effusion.   Mediastinum/Nodes: No enlarged mediastinal, hilar, or axillary lymph nodes. Thyroid gland, trachea, and esophagus demonstrate no significant findings.   Lungs/Pleura: Mild to moderate atelectasis in both lung bases and in the SUPERIOR segment LEFT LOWER lobe noted.   There is no evidence of airspace disease, consolidation, mass, suspicious nodule, pleural effusion or pneumothorax.   Musculoskeletal: No acute or suspicious bony abnormalities are noted.   CT ABDOMEN PELVIS FINDINGS   Hepatobiliary: The liver and gallbladder are unremarkable. No biliary dilatation.   Pancreas: Unremarkable   Spleen: Unremarkable   Adrenals/Urinary Tract: The kidneys, adrenal glands and bladder are unremarkable.   Stomach/Bowel: Stomach is within normal limits. Appendix appears normal. No evidence of bowel wall thickening, distention, or inflammatory changes.   Vascular/Lymphatic: Aortic atherosclerosis. No enlarged abdominal or pelvic lymph nodes.   Reproductive: Prostatectomy.   Other: No ascites, focal collection or pneumoperitoneum.   Musculoskeletal: No acute or suspicious bony abnormalities. 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   IMPRESSION: 1. No evidence of primary malignancy or metastatic disease. 2. Mild to moderate bibasilar and SUPERIOR segment LEFT LOWER lobe atelectasis. 3. Coronary artery  disease. 4. Aortic Atherosclerosis (ICD10-I70.0).   MRI head 06/16/20: FINDINGS: Brain: Examination mildly degraded by motion artifact.   Cerebral volume within normal limits. Mild chronic microvascular ischemic disease noted involving the periventricular white matter. No abnormal foci of restricted diffusion to suggest acute or subacute ischemia. Gray-white matter differentiation maintained. No encephalomalacia to suggest chronic cortical infarction. No evidence for acute or chronic intracranial hemorrhage.    1.4 x 1.1 x 1.2 cm well-circumscribed cystic lesion seen within the pituitary gland, indeterminate, but 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 Suprasellar extension without significant mass effect on the optic chiasm.   No other mass lesion, midline shift or mass effect. No hydrocephalus or extra-axial fluid collection.   Vascular: Major intracranial vascular flow voids are well maintained.   Skull and upper cervical spine: Craniocervical junction within normal limits. Bone marrow signal intensity normal. No scalp soft tissue abnormality.   Sinuses/Orbits: Patient status post bilateral ocular lens replacement. Globes and orbital soft tissues within normal limits. Fluid seen within the nasopharynx. Patient is intubated. Trace bilateral mastoid effusions noted.   Other: None.   IMPRESSION: 1. No acute intracranial abnormality. 2. Mild chronic microvascular ischemic disease for age. 3. 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  EKG:  EKG is personally reviewed. 04/19/2022:  EKG was not ordered. 10/11/2021: Sinus rhythm. Rate 68 bpm. 12/22/2020: no ekg ordered today.  Recent Labs: 11/09/2021: TSH 0.04 02/07/2022: BUN 21; Creatinine, Ser 1.25; Potassium 4.2; Sodium 140   Recent Lipid Panel    Component Value Date/Time   CHOL 135 10/11/2021 0910   TRIG 98 10/11/2021 0910   HDL 69 10/11/2021 0910   CHOLHDL 2.0 10/11/2021 0910   CHOLHDL 3.5 06/16/2020 0344   VLDL 21 06/16/2020 0344   LDLCALC 48 10/11/2021 0910     Risk Assessment/Calculations:    CHA2DS2-VASc Score = 3  This indicates a 3.2% annual risk of stroke. The patient's score is based upon: CHF History: 0 HTN History: 0 Diabetes History: 0 Stroke History: 0 Vascular Disease History: 1 Age Score: 2 Gender Score: 0     Physical Exam:    VS:  BP 132/82   Pulse 71   Ht '5\' 10"'$  (1.778 m)    Wt 227 lb (103 kg)   SpO2 97%   BMI 32.57 kg/m     Wt Readings from Last 3 Encounters:  04/19/22 227 lb (103 kg)  02/07/22 230 lb 12.8 oz (104.7 kg)  11/14/21 234 lb (106.1 kg)     GEN:  Well nourished, well developed in no acute distress HEENT: Normal NECK: No JVD; No carotid bruits CARDIAC: RRR, no murmurs, rubs, gallops RESPIRATORY:  CTAB, no wheezes ABDOMEN: Soft, non-tender, non-distended MUSCULOSKELETAL:  Warm, no edema SKIN: Warm and dry NEUROLOGIC:  Alert and oriented x 3 PSYCHIATRIC:  Normal affect   ASSESSMENT:    1. Atrial fibrillation, unspecified type ([place])   2. Hyponatremia   3. Thyroid disease   4. Coronary artery calcification   5. Hyperlipidemia, unspecified hyperlipidemia type   6. 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 Developed in the setting of elevated thyroid levels. Wanted to proceed with anticoagulation to reduce stroke risk as patient is pre-diabetic and has a positive family history for stroke in  the family. Tolerating apixaban well with no bleeding. Not on BB. Back in NSR. Discussed option of loop and trial of AC now that pituitary insufficiency significantly better, but he would like to just continue with current management for now.  -Continue eliquis '5mg'$  BID for anticoagulation -Not on BB as was hypotensive in the setting of Endocrinopathy; BP and HR well controlled. Will continue to monitor and add back if needed -TTE with LVEF 50-55%, no regional WMA   #Pituitary Insufficiency: #Central hypothyroidism #SIADH #Weight loss: Patient initially presented with unintentional wight loss of 40lbs with labs consistent with severely low TSH. Also with hyponatremia and significant daytime somnolence with concern for underlying Endocrinopathy. Was referred to Dr. Garnette Scheuermann and was diagnosed with pituitary  insufficiency. Now on prednisone, synthroid, testosterone and fluid restriction with remarkable improvement. Energy levels significantly improved. Gained weight. Na well controlled. No recurrent Afib. -Continue follow-up with Endocrine as scheduled -Continue synthroid, prednisone and fluid restriction   #Coronary Calcification: Noted on CT chest. Asymptomatic with no exertional chest pain, SOB or faitgue. LDL at goal of47.5 on 10/03/20. -Continue crestor '20mg'$  daily -LDL at goal <70   #Hyponatremia #AMS, stroke-like symptoms: Patient admitted in early Jan 2022 with left gaze deviation and right sided neglect as well as AMS. MRI head without stroke. CTA without significant vascular disease. Did not received tPA as on systemic AC. EEG without seizures. Thought symptoms driven by hyponatremia with Na 117. Urine studies consistent with SIADH. Currently feeling better with no recurrence of symptoms. On 1.5L fluid restriction. -Follow-up with Endocrinology as scheduled   #Weight loss: Significantly improved. Patient with profound weight loss thought to be driven by iatrogenic hyperthyroidism and pituitary insufficiency. CT chest/abd/pelvis without evidence of mass.  Now significantly improved with treatment of pituitary insufficiency. -Follow-up with endocrine as above -Fortunately, no evidence of malignancy on CT c/a/p  Follow up in 6 months.  Medication Adjustments/Labs and Tests Ordered: Current medicines are reviewed at length with the patient today.  Concerns regarding medicines are outlined above.   No orders of the defined types were placed in this encounter.  Meds ordered this encounter  Medications   apixaban (ELIQUIS) 5 MG TABS tablet    Sig: Take 1 tablet (5 mg total) by mouth 2 (two) times daily.    Dispense:  180 tablet    Refill:  3   Patient Instructions  Medication Instructions:   Your physician recommends that you continue on your current medications as directed. Please  refer to the Current Medication list given to you today.  *If you need a refill on your cardiac medications before your next appointment, please call your pharmacy*    Follow-Up: At [facility], you and your health needs are our priority.  As part of our continuing mission to provide you with exceptional heart care, we have created designated Provider Care Teams.  These Care Teams include your primary Cardiologist (physician) and Advanced Practice Providers (APPs -  Physician Assistants and Nurse Practitioners) who all work together to provide you with the care you need, when you need it.  We recommend signing up for the patient portal called "MyChart".  Sign up information is provided on this After Visit Summary.  MyChart is used to connect with patients for Virtual Visits (Telemedicine).  Patients are able to view lab/test results, encounter notes, upcoming appointments, etc.  Non-urgent messages can be sent to your provider as well.   To learn more about what you can do with MyChart, go to  [email or web address].    Your next appointment:   6 month(s)  The format for your next appointment:   In Person  Provider:   Freada Bergeron, MD      Important Information About Sugar        I,Mathew Stumpf,acting as a scribe for Freada Bergeron, MD.,have documented all relevant documentation on the behalf of Freada Bergeron, MD,as directed by  Freada Bergeron, MD while in the presence of Freada Bergeron, MD.  I, Freada Bergeron, MD, have reviewed all documentation for this visit. The documentation on 04/19/22 for the exam, diagnosis, procedures, and orders are all accurate and complete.    Signed, Freada Bergeron, MD  04/19/2022 9:05 AM    [place]

## 2022-04-21 IMAGING — US US THYROID
1 series · 13 of 25 positions shown · non-contrast
Comparison: None.

CLINICAL DATA: Multinodular goiter follow-up

EXAM:
THYROID ULTRASOUND
TECHNIQUE: Ultrasound examination of the thyroid gland and adjacent soft
tissues was performed.

[Series 1: us thyroid · 48 acquisitions, 13 frames shown]
[im 1/48]
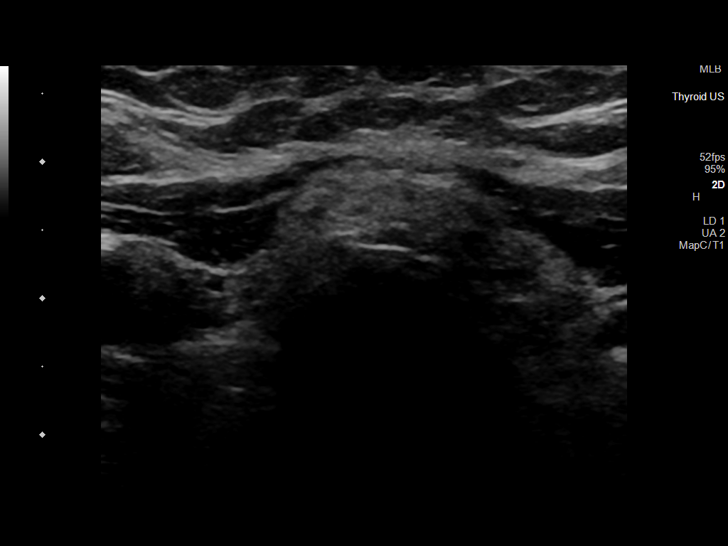
[im 4/48]
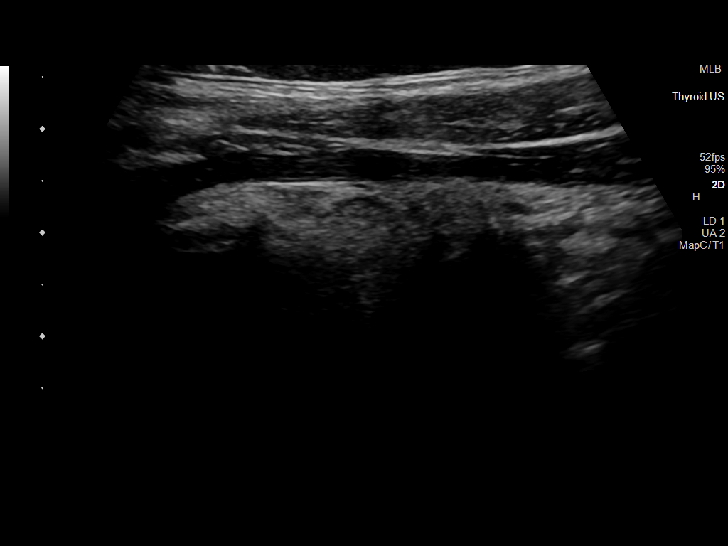
[im 8/48]
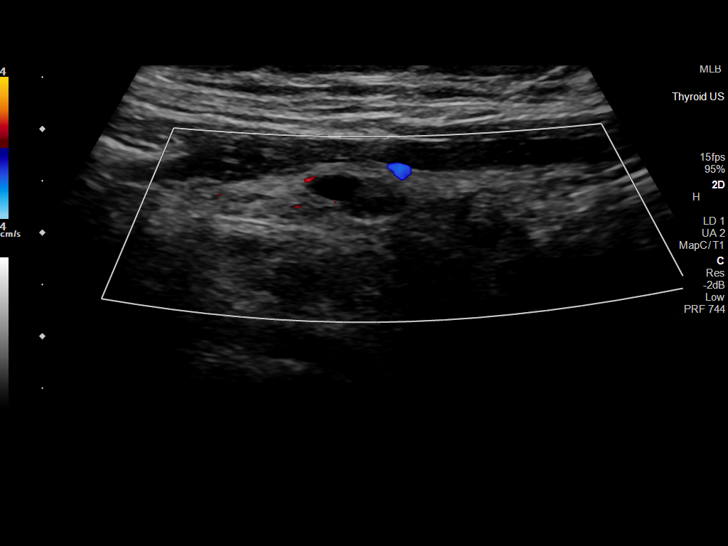
[im 12/48]
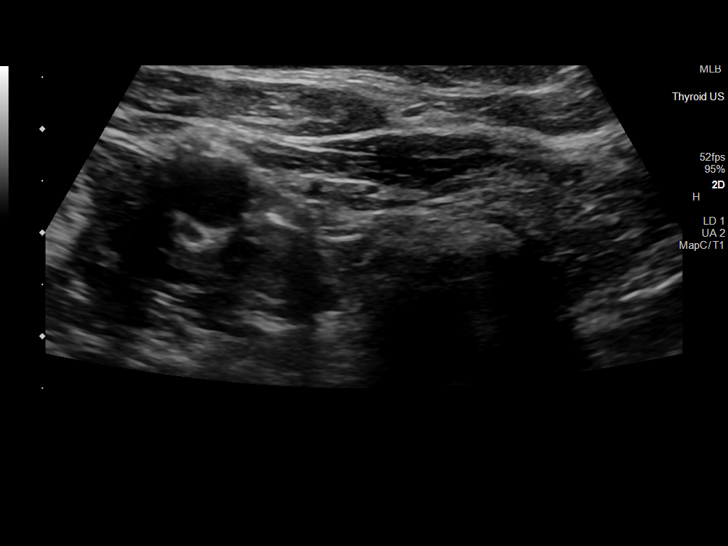
[im 16/48]
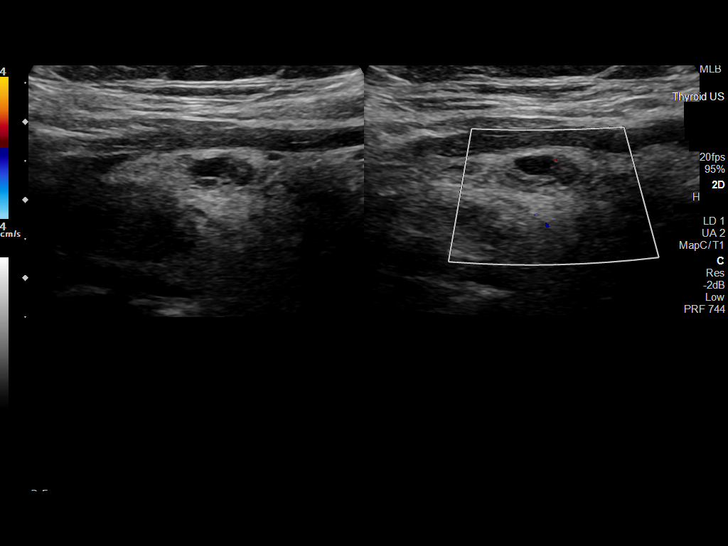
[im 20/48]
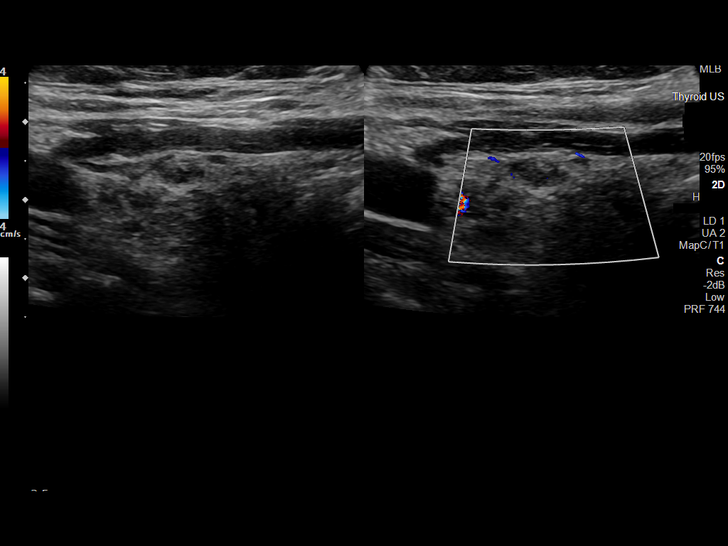
[im 24/48]
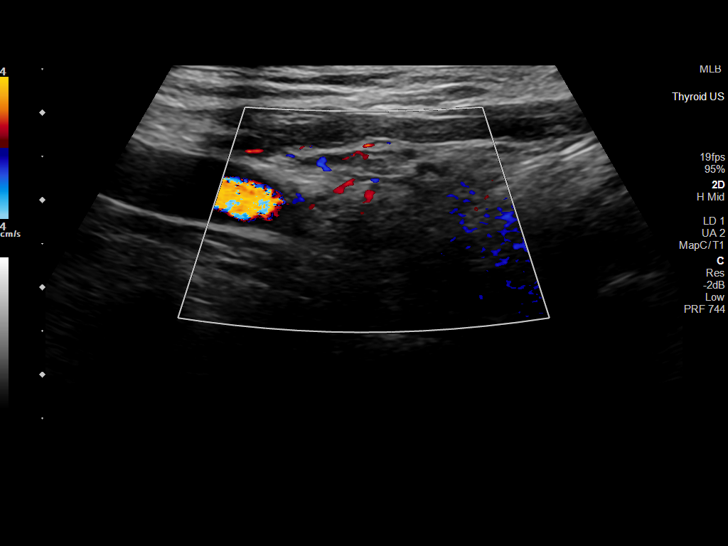
[im 28/48]
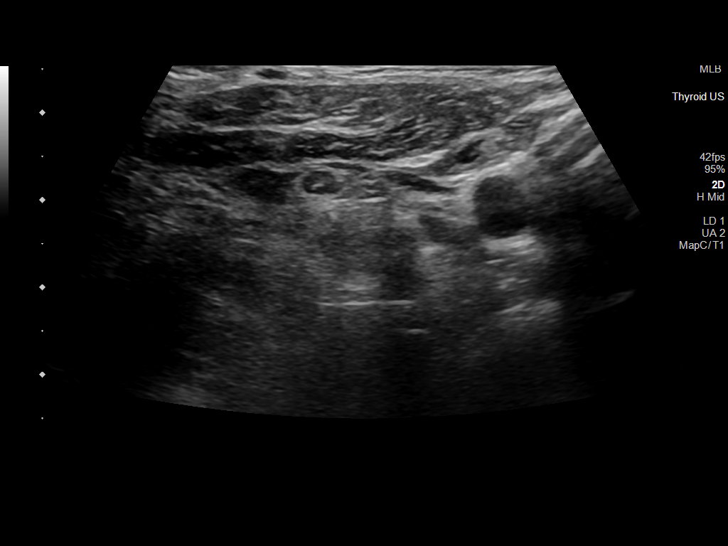
[im 32/48]
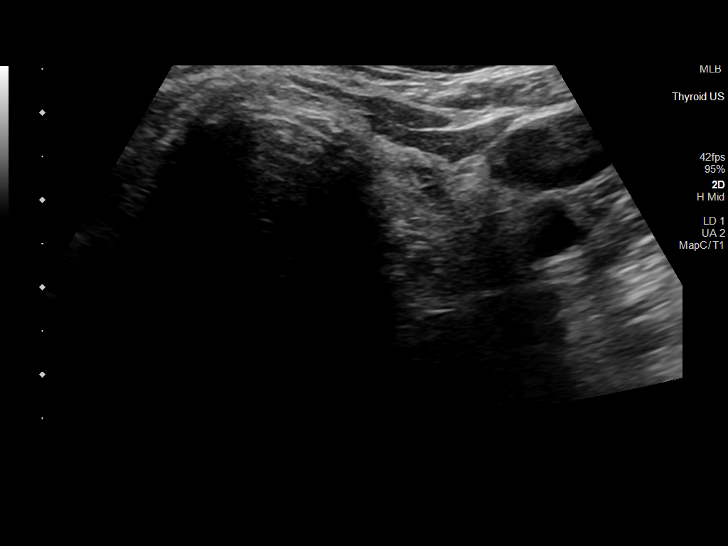
[im 36/48]
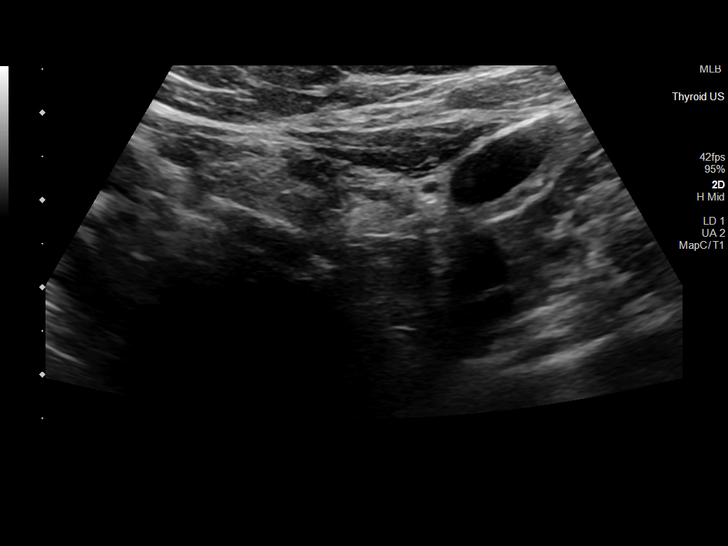
[im 40/48]
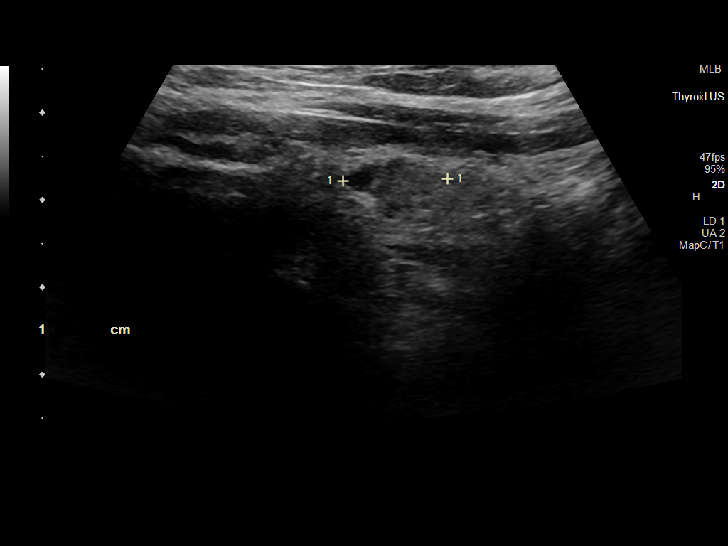
[im 44/48]
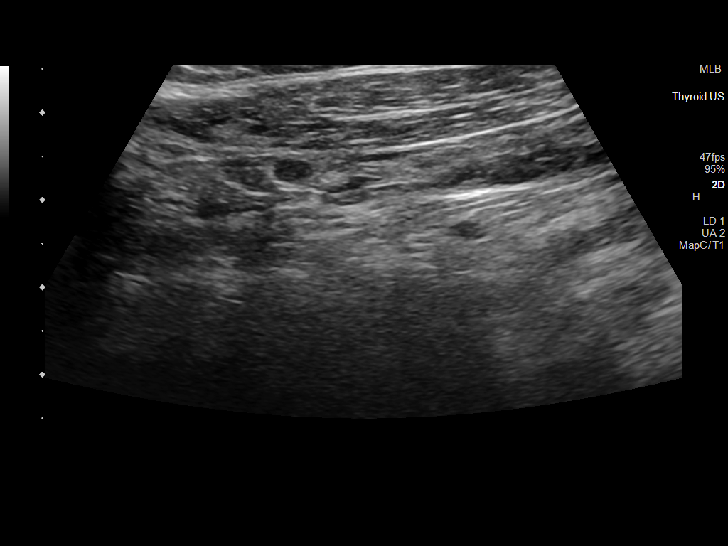
[im 48/48]
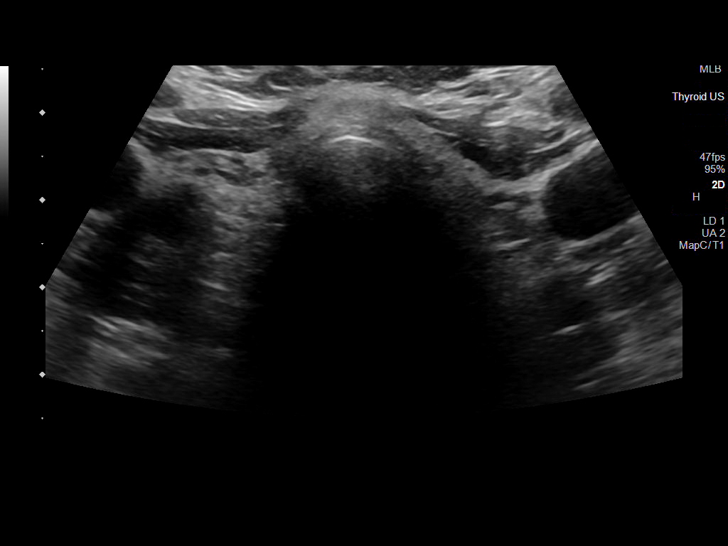

[13 of 25 positions shown; findings below may reference images not displayed]

FINDINGS: Parenchymal Echotexture: Markedly heterogenous

Isthmus: 0.3 cm

Right lobe: 4.1 x 1.6 x 1.2 cm

Left lobe: 4.8 x 1.9 x 1.6 cm

_________________________________________________________

Estimated total number of nodules >/= 1 cm: 2

Number of spongiform nodules >/=  2 cm not described below (TR1): 0

Number of mixed cystic and solid nodules >/= 1.5 cm not described
below (TR2): 0

_________________________________________________________

Nodule 1: 0.9 x 0.8 x 0.5 cm predominantly cystic right mid thyroid
nodule does not meet criteria for FNA or imaging follow-up.

_________________________________________________________

Nodule 2: 0.7 x 0.7 x 0.4 cm hypoechoic nodule in the mid right
thyroid lobe does not meet criteria for imaging surveillance or FNA.

_________________________________________________________

Nodule # 3:

Prior biopsy: No

Location: Right; inferior

Maximum size: 1.4 cm; Other 2 dimensions: 1.4 x 1.1 cm, previously,
1.2 x 1.0 x 0.9 cm

Composition: solid/almost completely solid (2)

Echogenicity: hypoechoic (2)

Shape: not taller-than-wide (0)

Margins: ill-defined (0)

Echogenic foci: none (0)

ACR TI-RADS total points: 4.

ACR TI-RADS risk category:  TR4 (4-6 points).

Significant change in size (>/= 20% in two dimensions and minimal
increase of 2 mm): No

Change in features: No

Change in ACR TI-RADS risk category: No

ACR TI-RADS recommendations:

*Given size (>/= 1 - 1.4 cm) and appearance, a follow-up ultrasound
in 1 year should be considered based on TI-RADS criteria.

_________________________________________________________

Nodule # 4:

Location: Left; inferior

Maximum size: 1.0 cm; Other 2 dimensions: 0.6 x 0.7 cm

Composition: solid/almost completely solid (2)

Echogenicity: isoechoic (1)

Shape: not taller-than-wide (0)

Margins: smooth (0)

Echogenic foci: none (0)

ACR TI-RADS total points: 3.

ACR TI-RADS risk category: TR3 (3 points).

ACR TI-RADS recommendations:

Given size (<1.4 cm) and appearance, this nodule does NOT meet
TI-RADS criteria for biopsy or dedicated follow-up.

_________________________________________________________
IMPRESSION: Nodule 3 (TI-RADS 4), now measures 1.4 x 1.4 x 1.1 cm compared to
1.2 x 1.0 x 0.9 cm on the prior exam. It is located in the inferior
right thyroid lobe and meets criteria for imaging follow-up. Annual
ultrasound surveillance is recommended until 5 years of stability is
documented.

The above is in keeping with the ACR TI-RADS recommendations - [HOSPITAL] 3508;[DATE].

## 2022-05-17 DIAGNOSIS — Z8546 Personal history of malignant neoplasm of prostate: Secondary | ICD-10-CM | POA: Diagnosis not present

## 2022-05-24 DIAGNOSIS — Z8546 Personal history of malignant neoplasm of prostate: Secondary | ICD-10-CM | POA: Diagnosis not present

## 2022-05-24 DIAGNOSIS — R311 Benign essential microscopic hematuria: Secondary | ICD-10-CM | POA: Diagnosis not present

## 2022-05-24 DIAGNOSIS — R3915 Urgency of urination: Secondary | ICD-10-CM | POA: Diagnosis not present

## 2022-05-24 DIAGNOSIS — R3129 Other microscopic hematuria: Secondary | ICD-10-CM | POA: Diagnosis not present

## 2022-05-24 DIAGNOSIS — N393 Stress incontinence (female) (male): Secondary | ICD-10-CM | POA: Diagnosis not present

## 2022-06-11 DIAGNOSIS — R69 Illness, unspecified: Secondary | ICD-10-CM | POA: Diagnosis not present

## 2022-06-12 ENCOUNTER — Encounter (INDEPENDENT_AMBULATORY_CARE_PROVIDER_SITE_OTHER): Payer: Medicare Other | Admitting: Ophthalmology

## 2022-06-19 ENCOUNTER — Encounter (INDEPENDENT_AMBULATORY_CARE_PROVIDER_SITE_OTHER): Payer: Medicare Other | Admitting: Ophthalmology

## 2022-06-20 DIAGNOSIS — L309 Dermatitis, unspecified: Secondary | ICD-10-CM | POA: Diagnosis not present

## 2022-06-20 DIAGNOSIS — L718 Other rosacea: Secondary | ICD-10-CM | POA: Diagnosis not present

## 2022-06-20 DIAGNOSIS — D485 Neoplasm of uncertain behavior of skin: Secondary | ICD-10-CM | POA: Diagnosis not present

## 2022-06-20 DIAGNOSIS — M792 Neuralgia and neuritis, unspecified: Secondary | ICD-10-CM | POA: Diagnosis not present

## 2022-06-20 DIAGNOSIS — C44319 Basal cell carcinoma of skin of other parts of face: Secondary | ICD-10-CM | POA: Diagnosis not present

## 2022-06-20 DIAGNOSIS — L814 Other melanin hyperpigmentation: Secondary | ICD-10-CM | POA: Diagnosis not present

## 2022-06-20 DIAGNOSIS — L821 Other seborrheic keratosis: Secondary | ICD-10-CM | POA: Diagnosis not present

## 2022-06-20 DIAGNOSIS — D225 Melanocytic nevi of trunk: Secondary | ICD-10-CM | POA: Diagnosis not present

## 2022-06-25 ENCOUNTER — Other Ambulatory Visit: Payer: Self-pay | Admitting: Endocrinology

## 2022-06-25 ENCOUNTER — Telehealth: Payer: Self-pay

## 2022-06-25 NOTE — Telephone Encounter (Signed)
Incoming RX request for:  TESTOSTERONE 30 MG/1.5 ML PUMP

## 2022-07-02 DIAGNOSIS — H35372 Puckering of macula, left eye: Secondary | ICD-10-CM | POA: Diagnosis not present

## 2022-07-02 DIAGNOSIS — H353132 Nonexudative age-related macular degeneration, bilateral, intermediate dry stage: Secondary | ICD-10-CM | POA: Diagnosis not present

## 2022-07-09 ENCOUNTER — Ambulatory Visit (INDEPENDENT_AMBULATORY_CARE_PROVIDER_SITE_OTHER): Payer: Medicare HMO | Admitting: Internal Medicine

## 2022-07-09 ENCOUNTER — Encounter: Payer: Self-pay | Admitting: Internal Medicine

## 2022-07-09 VITALS — BP 128/86 | HR 86 | Temp 97.9°F | Ht 70.0 in | Wt 227.0 lb

## 2022-07-09 DIAGNOSIS — I480 Paroxysmal atrial fibrillation: Secondary | ICD-10-CM | POA: Diagnosis not present

## 2022-07-09 DIAGNOSIS — Z0000 Encounter for general adult medical examination without abnormal findings: Secondary | ICD-10-CM | POA: Diagnosis not present

## 2022-07-09 DIAGNOSIS — R7301 Impaired fasting glucose: Secondary | ICD-10-CM | POA: Insufficient documentation

## 2022-07-09 DIAGNOSIS — E230 Hypopituitarism: Secondary | ICD-10-CM | POA: Diagnosis not present

## 2022-07-09 DIAGNOSIS — E2749 Other adrenocortical insufficiency: Secondary | ICD-10-CM

## 2022-07-09 DIAGNOSIS — I700 Atherosclerosis of aorta: Secondary | ICD-10-CM

## 2022-07-09 DIAGNOSIS — E119 Type 2 diabetes mellitus without complications: Secondary | ICD-10-CM | POA: Insufficient documentation

## 2022-07-09 DIAGNOSIS — E118 Type 2 diabetes mellitus with unspecified complications: Secondary | ICD-10-CM | POA: Insufficient documentation

## 2022-07-09 LAB — LIPID PANEL
Cholesterol: 132 mg/dL (ref 0–200)
HDL: 59.1 mg/dL (ref >39.00)
LDL Cholesterol: 37 mg/dL (ref 0–99)
NonHDL: 73.24
Total CHOL/HDL Ratio: 2
Triglycerides: 179 mg/dL — ABNORMAL HIGH (ref 0.0–149.0)
VLDL: 35.8 mg/dL (ref 0.0–40.0)

## 2022-07-09 LAB — HEMOGLOBIN A1C: Hgb A1c MFr Bld: 6.8 % — ABNORMAL HIGH (ref 4.6–6.5)

## 2022-07-09 NOTE — Assessment & Plan Note (Signed)
Checking lipid panel and adjust as needed.  

## 2022-07-09 NOTE — Assessment & Plan Note (Signed)
Flu shot counseled. Covid-19 counseled. [facility]neumonia done with [facility] recently getting records. Shingrix counseled. Tetanus due later this year. Colonoscopy getting later this year. Counseled about sun safety and mole surveillance. Counseled about the dangers of distracted driving. Given 10 year screening recommendations.

## 2022-07-09 NOTE — Assessment & Plan Note (Signed)
Checking lipid panel. He is taking testosterone, levothyroxine, and prednisone for hormonal replacement and seeing endo for management of levels. Sugars have been elevated will check HgA1c today.

## 2022-07-09 NOTE — Assessment & Plan Note (Signed)
Not on rate control and sounds regular today. Taking eliquis 5 mg BID which we will continue.

## 2022-07-09 NOTE — Assessment & Plan Note (Signed)
Suspect due to chronic prednisone for hypopituitarism. Checking HgA1c.

## 2022-07-09 NOTE — Assessment & Plan Note (Signed)
Taking prednisone 5 mg daily. Recent glucose levels elevated will screen with HgA1c for pre-diabetes or diabetes. Will need DEXA in 2-3 years due to chronic prednisone usage since 2022.

## 2022-07-09 NOTE — Progress Notes (Signed)
   Subjective:   Patient ID: Jermaine Soto, male    DOB: 01/02/1946, 77 y.o.   MRN: [id number]  HPI The patient is a 77 YO man coming in for [facility] and no PCP care for about 2 years or so needing physical. Seeing endo and cardiology.    PMH, [facility], social history reviewed and updated  Review of Systems  Constitutional: Negative.   HENT: Negative.    Eyes: Negative.   Respiratory:  Negative for cough, chest tightness and shortness of breath.   Cardiovascular:  Negative for chest pain, palpitations and leg swelling.  Gastrointestinal:  Negative for abdominal distention, abdominal pain, constipation, diarrhea, nausea and vomiting.  Musculoskeletal: Negative.   Skin: Negative.   Neurological: Negative.   Psychiatric/Behavioral: Negative.      Objective:  Physical Exam Constitutional:      Appearance: He is well-developed.  HENT:     Head: Normocephalic and atraumatic.  Cardiovascular:     Rate and Rhythm: Normal rate and regular rhythm.  Pulmonary:     Effort: Pulmonary effort is normal. No respiratory distress.     Breath sounds: Normal breath sounds. No wheezing or rales.  Abdominal:     General: Bowel sounds are normal. There is no distension.     Palpations: Abdomen is soft.     Tenderness: There is no abdominal tenderness. There is no rebound.  Musculoskeletal:     Cervical back: Normal range of motion.  Skin:    General: Skin is warm and dry.  Neurological:     Mental Status: He is alert and oriented to person, place, and time.     Coordination: Coordination normal.     Vitals:   07/09/22 1320  BP: 128/86  Pulse: 86  Temp: 97.9 F (36.6 C)  TempSrc: Oral  SpO2: 98%  Weight: 227 lb (103 kg)  Height: '5\' 10"'$  (1.778 m)    Assessment & Plan:

## 2022-07-10 ENCOUNTER — Other Ambulatory Visit: Payer: Self-pay

## 2022-07-10 DIAGNOSIS — E230 Hypopituitarism: Secondary | ICD-10-CM

## 2022-07-10 MED ORDER — LEVOTHYROXINE SODIUM 100 MCG PO TABS: 100.0000 ug | ORAL_TABLET | Freq: Every day | ORAL | 1 refills | Status: DC

## 2022-07-11 DIAGNOSIS — C44319 Basal cell carcinoma of skin of other parts of face: Secondary | ICD-10-CM | POA: Diagnosis not present

## 2022-07-12 DIAGNOSIS — R69 Illness, unspecified: Secondary | ICD-10-CM | POA: Diagnosis not present

## 2022-07-16 ENCOUNTER — Telehealth: Payer: Self-pay

## 2022-07-16 NOTE — Telephone Encounter (Signed)
Left message for patient to call back to schedule Medicare Annual Wellness Visit   Last AWV  08/09/20  Please schedule at anytime with [facility] if patient calls the office back.    30 Minutes appointment   Any questions, please call me at [phone]

## 2022-07-18 ENCOUNTER — Other Ambulatory Visit: Payer: Self-pay

## 2022-07-18 DIAGNOSIS — E230 Hypopituitarism: Secondary | ICD-10-CM

## 2022-07-18 MED ORDER — PREDNISONE 5 MG PO TABS: 5.0000 mg | ORAL_TABLET | Freq: Every day | ORAL | 3 refills | Status: DC

## 2022-08-06 ENCOUNTER — Other Ambulatory Visit (INDEPENDENT_AMBULATORY_CARE_PROVIDER_SITE_OTHER): Payer: Medicare HMO

## 2022-08-06 DIAGNOSIS — E042 Nontoxic multinodular goiter: Secondary | ICD-10-CM

## 2022-08-06 DIAGNOSIS — E230 Hypopituitarism: Secondary | ICD-10-CM | POA: Diagnosis not present

## 2022-08-06 LAB — CBC
HCT: 42.9 % (ref 39.0–52.0)
Hemoglobin: 14.5 g/dL (ref 13.0–17.0)
MCHC: 33.8 g/dL (ref 30.0–36.0)
MCV: 94.8 fl (ref 78.0–100.0)
Platelets: 240 10*3/uL (ref 150.0–400.0)
RBC: 4.53 Mil/uL (ref 4.22–5.81)
RDW: 14 % (ref 11.5–15.5)
WBC: 9.8 10*3/uL (ref 4.0–10.5)

## 2022-08-06 LAB — BASIC METABOLIC PANEL
BUN: 21 mg/dL (ref 6–23)
CO2: 27 mEq/L (ref 19–32)
Calcium: 9.8 mg/dL (ref 8.4–10.5)
Chloride: 102 mEq/L (ref 96–112)
Creatinine, Ser: 1.06 mg/dL (ref 0.40–1.50)
GFR: 68.11 mL/min (ref >60.00)
Glucose, Bld: 105 mg/dL — ABNORMAL HIGH (ref 70–99)
Potassium: 4.1 mEq/L (ref 3.5–5.1)
Sodium: 139 mEq/L (ref 135–145)

## 2022-08-06 LAB — TESTOSTERONE: Testosterone: 299.2 ng/dL — ABNORMAL LOW (ref 300.00–890.00)

## 2022-08-06 LAB — T4, FREE: Free T4: 1.08 ng/dL (ref 0.60–1.60)

## 2022-08-06 LAB — TSH: TSH: 0.04 u[IU]/mL — ABNORMAL LOW (ref 0.35–5.50)

## 2022-08-09 ENCOUNTER — Encounter: Payer: Self-pay | Admitting: Endocrinology

## 2022-08-09 ENCOUNTER — Ambulatory Visit: Payer: Medicare Other | Admitting: Internal Medicine

## 2022-08-09 ENCOUNTER — Ambulatory Visit: Payer: Medicare HMO | Admitting: Endocrinology

## 2022-08-09 VITALS — BP 124/76 | HR 88 | Ht 70.0 in | Wt 230.4 lb

## 2022-08-09 DIAGNOSIS — E230 Hypopituitarism: Secondary | ICD-10-CM | POA: Diagnosis not present

## 2022-08-09 NOTE — Progress Notes (Signed)
Patient ID: Jermaine Soto, male   DOB: Apr 10, 1946, 77 y.o.   MRN: [id number]   Reason for Appointment: HYPOPITUITARISM, followup visit    History of Present Illness:   HYPOTHYROIDISM was first diagnosed in 2016   The patient has been treated with synthroid since 2016, for hypothyroidism Prior to endocrinology consultation he was being treated by PCP with low doses of levothyroxine between 50 and 75 mcg This was subsequently increased when the diagnosis of SECONDARY hypothyroidism was made during his hospitalization in 1/22  He is currently taking 100 mcg of levothyroxine as a stable dose  Recently has noted unusual fatigue He does feel a little sleepy if he is sitting still           The patient is taking the thyroid supplement very regularly in the morning before breakfast.  His dose has been continued unchanged his last visit  Wt Readings from Last 3 Encounters:  08/09/22 230 lb 6.4 oz (104.5 kg)  07/09/22 227 lb (103 kg)  04/19/22 227 lb (103 kg)    Lab Results  Component Value Date   TSH 0.04 (L) 08/06/2022   TSH 0.04 (L) 11/09/2021   TSH 0.03 (L) 07/10/2021   FREET4 1.08 08/06/2022   FREET4 1.13 11/09/2021   FREET4 0.97 07/10/2021  \  ADRENAL insufficiency:   In January 2022 he was admitted with symptoms of a stroke and sodium of 121 Although he was not showing any hypotension he had significantly low cortisol levels Subsequently has been taking only 5 mg prednisone daily He does not complain of any nausea, lightheadedness decreased appetite Sodium has been consistently normal  Wt Readings from Last 3 Encounters:  08/09/22 230 lb 6.4 oz (104.5 kg)  07/09/22 227 lb (103 kg)  04/19/22 227 lb (103 kg)    TESTOSTERONE deficiency: Although he has had 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 If you have any questions, ask your nurse or doctor.          acetaminophen 325 MG tablet Commonly known as: TYLENOL Take 325 mg by mouth every 6 (six) hours as needed for mild pain, fever or headache.   apixaban 5 MG Tabs tablet Commonly known as: Eliquis Take 1 tablet (5 mg  total) by mouth 2 (two) times daily.   levothyroxine 100 MCG tablet Commonly known as: SYNTHROID Take 1 tablet (100 mcg total) by mouth daily.   predniSONE 5 MG tablet Commonly known as: DELTASONE Take 1 tablet (5 mg total) by mouth daily with breakfast.   rosuvastatin 20 MG tablet Commonly known as: CRESTOR Take 1 tablet (20 mg total) by mouth daily.   Testosterone 30 MG/ACT Soln DISPENSE LIQUID INTO APPLICATION CUP, APPLY IN THE ARMPITS, ONCE ON EACH SIDE   Vitamin D3 125 MCG (5000 UT) capsule Generic drug: Cholecalciferol Take 5,000 Units by mouth daily.        Allergies:  Allergies  Allergen Reactions   Sulfa Antibiotics     Unknown   Tape Itching    Past Medical History:  Diagnosis Date   Arthritis     hands    Hypothyroidism     Past Surgical History:  Procedure Laterality Date   IR ANGIO INTRA EXTRACRAN SEL COM CAROTID INNOMINATE BILAT MOD SED  06/15/2020   IR ANGIO VERTEBRAL SEL VERTEBRAL BILAT MOD SED  06/15/2020   RADIOLOGY WITH ANESTHESIA N/A 06/15/2020   Procedure: IR WITH ANESTHESIA;  Surgeon: Radiologist, Medication, MD;  Location: [facility];  Service: Radiology;  Laterality: N/A;   ROBOT ASSISTED LAPAROSCOPIC RADICAL PROSTATECTOMY N/A 12/24/2012   Procedure: ROBOTIC ASSISTED LAPAROSCOPIC RADICAL PROSTATECTOMY;  Surgeon: Bernestine Amass, MD;  Location: [facility];  Service: Urology;  Laterality: N/A;    Family History  Problem Relation Age of Onset   Thyroid disease Neg Hx     Social History:  reports that he has never smoked. He has never used smokeless tobacco. He reports current alcohol use. He reports that he does not use drugs.  Review of Systems  He has a small multinodular goiter  Conclusion of ultrasound on 07/17/2021 shows  Nodule 3 (TI-RADS 4), now measures 1.4 x 1.4 x 1.1 cm compared to 1.2 x 1.0 x 0.9 cm on the prior exam. It is located in the inferior right thyroid lobe and meets criteria for imaging follow-up. Annual ultrasound surveillance is recommended until 5 years of stability is documented.    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, adrenal and testosterone  Hypothyroidism: Although he has some afternoon drowsiness overall his energy level is fairly good and unlikely has symptoms of hypothyroidism Free T4 is in a good range and stable with 100 mcg supplementation  ADRENAL insufficiency, partial: Subjectively the doing well with only 5 mg of prednisone in the morning without any weakness, nausea or  low sodium  No orthostatic drop in blood pressure   HYPOGONADISM: He has history of significant hypogonadism Although his testosterone level is low normal he is not apply the medication the day he came in and also may not be using the proper technique to apply the Axiron solution    Treatment:   No change in medication regimen Discussed application technique for Axiron Discussed stress doses of up to 20 mg prednisone if he has any acute illness/surgery for the duration of illness  Continue taking levothyroxine before breakfast unchanged  Follow-up in 4 months   Shalom Ware 08/09/2022, 8:49 AM  Note: This office note was prepared with Estate agent. Any transcriptional errors that result from this process are unintentional.

## 2022-08-09 NOTE — Patient Instructions (Addendum)
Check Axiron medication application technique on video on line  Apply the solution starting at the lower end of the armpit and move up and down to prevent it from running down  If you have an acute infection or need surgery will need short-term increase in prednisone to 20 mg a day

## 2022-08-10 DIAGNOSIS — R69 Illness, unspecified: Secondary | ICD-10-CM | POA: Diagnosis not present

## 2022-08-15 DIAGNOSIS — Z7901 Long term (current) use of anticoagulants: Secondary | ICD-10-CM | POA: Diagnosis not present

## 2022-08-15 DIAGNOSIS — I4891 Unspecified atrial fibrillation: Secondary | ICD-10-CM | POA: Diagnosis not present

## 2022-08-15 DIAGNOSIS — Z86010 Personal history of colonic polyps: Secondary | ICD-10-CM | POA: Diagnosis not present

## 2022-08-16 ENCOUNTER — Telehealth: Payer: Self-pay

## 2022-08-16 ENCOUNTER — Telehealth: Payer: Self-pay | Admitting: *Deleted

## 2022-08-16 NOTE — Telephone Encounter (Signed)
   Pre-operative Risk Assessment    Patient Name: Jermaine Soto  DOB: February 01, 1946 MRN: [id number]      Request for Surgical Clearance    Procedure:   Colonoscopy  Date of Surgery:  Clearance 09/26/22                                 Surgeon:  Dr. Cristina Gong Surgeon's Group or Practice Name:  [facility] Phone number:  [phone] Fax number:  [phone]   Type of Clearance Requested:   - Medical  - Pharmacy:  Hold Apixaban (Eliquis) 2 days prior.   Type of Anesthesia:  Not Indicated   Additional requests/questions:    Signed, [name]   08/16/2022, 7:53 AM

## 2022-08-16 NOTE — Telephone Encounter (Signed)
Spoke with patient who is agreeable to do a tele visit on 3/25 at 9:20am. Consent and med rec are completed.

## 2022-08-16 NOTE — Telephone Encounter (Signed)
Patient with diagnosis of atrial fibrillation on Eliquis for anticoagulation.    Procedure: colonoscopy Date of procedure: 09/26/22   CHA2DS2-VASc Score = 3   This indicates a 3.2% annual risk of stroke. The patient's score is based upon: CHF History: 0 HTN History: 0 Diabetes History: 0 Stroke History: 0 Vascular Disease History: 1 Age Score: 2 Gender Score: 0    CrCl 88 Platelet count 240  Per office protocol, patient can hold Eliquis for 2 days prior to procedure.   Patient will not need bridging with Lovenox (enoxaparin) around procedure.  **This guidance is not considered finalized until pre-operative APP has relayed final recommendations.**

## 2022-08-16 NOTE — Telephone Encounter (Signed)
Primary Cardiologist:Heather Renae Fickle, MD   Preoperative team, please contact this patient and set up a phone call appointment for further preoperative risk assessment. Please obtain consent and complete medication review. Thank you for your help.   I confirm that guidance regarding antiplatelet and oral anticoagulation therapy has been completed and, if necessary, noted below.   Jermaine Life, NP-C  08/16/2022, 3:56 PM [address] Office [phone] Fax [phone]

## 2022-08-16 NOTE — Telephone Encounter (Signed)
  Patient Consent for Virtual Visit        Jermaine Soto has provided verbal consent on 08/16/2022 for a virtual visit (video or telephone).   CONSENT FOR VIRTUAL VISIT FOR:  Jermaine Soto  By participating in this virtual visit I agree to the following:  I hereby voluntarily request, consent and authorize [facility] and its employed or contracted physicians, physician assistants, nurse practitioners or other licensed health care professionals (the Practitioner), to provide me with telemedicine health care services (the "Services") as deemed necessary by the treating Practitioner. I acknowledge and consent to receive the Services by the Practitioner via telemedicine. I understand that the telemedicine visit will involve communicating with the Practitioner through live audiovisual communication technology and the disclosure of certain medical information by electronic transmission. I acknowledge that I have been given the opportunity to request an in-person assessment or other available alternative prior to the telemedicine visit and am voluntarily participating in the telemedicine visit.  I understand that I have the right to withhold or withdraw my consent to the use of telemedicine in the course of my care at any time, without affecting my right to future care or treatment, and that the Practitioner or I may terminate the telemedicine visit at any time. I understand that I have the right to inspect all information obtained and/or recorded in the course of the telemedicine visit and may receive copies of available information for a reasonable fee.  I understand that some of the potential risks of receiving the Services via telemedicine include:  Delay or interruption in medical evaluation due to technological equipment failure or disruption; Information transmitted may not be sufficient (e.g. poor resolution of images) to allow for appropriate medical decision making by the Practitioner; and/or   In rare instances, security protocols could fail, causing a breach of personal health information.  Furthermore, I acknowledge that it is my responsibility to provide information about my medical history, conditions and care that is complete and accurate to the best of my ability. I acknowledge that Practitioner's advice, recommendations, and/or decision may be based on factors not within their control, such as incomplete or inaccurate data provided by me or distortions of diagnostic images or specimens that may result from electronic transmissions. I understand that the practice of medicine is not an exact science and that Practitioner makes no warranties or guarantees regarding treatment outcomes. I acknowledge that a copy of this consent can be made available to me via my patient portal ([facility]), or I can request a printed copy by calling the office of [facility].    I understand that my insurance will be billed for this visit.   I have read or had this consent read to me. I understand the contents of this consent, which adequately explains the benefits and risks of the Services being provided via telemedicine.  I have been provided ample opportunity to ask questions regarding this consent and the Services and have had my questions answered to my satisfaction. I give my informed consent for the services to be provided through the use of telemedicine in my medical care

## 2022-09-02 NOTE — Progress Notes (Unsigned)
Virtual Visit via Telephone Note   Because of Jermaine Soto's co-morbid illnesses, he is at least at moderate risk for complications without adequate follow up.  This format is felt to be most appropriate for this patient at this time.  The patient did not have access to video technology/had technical difficulties with video requiring transitioning to audio format only (telephone).  All issues noted in this document were discussed and addressed.  No physical exam could be performed with this format.  Please refer to the patient's chart for his consent to telehealth for [facility].  Evaluation Performed:  Preoperative cardiovascular risk assessment _____________   Date:  09/02/2022   Patient ID:  Jermaine Soto, DOB 1945-07-08, MRN [id number] Patient Location:  Home Provider location:   Office  Primary Care Provider:  Hoyt Koch, MD Primary Cardiologist:  Freada Bergeron, MD  Chief Complaint / Patient Profile   77 y.o. 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  History of Present Illness    Jermaine Soto is a 77 y.o. male who presents via audio/video conferencing for a telehealth visit today.  Pt was last seen in cardiology clinic on 04/19/22 by Dr. Johney Frame.  At that time Callin Grise was doing well.  The patient is now pending procedure as outlined above. Since his last visit, he denies chest pain, shortness of breath, lower extremity edema, fatigue,  palpitations, melena, hematuria, hemoptysis, diaphoresis, weakness, presyncope, syncope, orthopnea, and PND. He is able to achieve > 4 METS activity without concerning symptoms.    Past Medical History    Past Medical History:  Diagnosis Date   Arthritis    hands    Hypothyroidism    Past Surgical History:  Procedure Laterality Date   IR ANGIO INTRA EXTRACRAN SEL COM CAROTID INNOMINATE BILAT MOD SED  06/15/2020   IR ANGIO VERTEBRAL SEL VERTEBRAL BILAT MOD SED  06/15/2020   RADIOLOGY WITH ANESTHESIA N/A 06/15/2020   Procedure: IR WITH ANESTHESIA;  Surgeon: Radiologist, Medication, MD;  Location: [facility];  Service: Radiology;  Laterality: N/A;   ROBOT ASSISTED LAPAROSCOPIC RADICAL PROSTATECTOMY N/A 12/24/2012   Procedure: ROBOTIC ASSISTED LAPAROSCOPIC RADICAL PROSTATECTOMY;  Surgeon: Bernestine Amass, MD;  Location: [facility];  Service: Urology;  Laterality: N/A;    Allergies  Allergies  Allergen Reactions   Sulfa Antibiotics     Unknown   Tape Itching    Home Medications    Prior to Admission medications   Medication Sig Start Date End Date Taking? Authorizing Provider  acetaminophen (TYLENOL) 325 MG tablet Take 325 mg by mouth every 6 (six) hours as needed for mild pain, fever or headache.    [provider]  apixaban (ELIQUIS) 5 MG TABS tablet Take 1 tablet (5 mg total) by mouth 2 (two) times daily. 04/19/22   Freada Bergeron, MD  Cholecalciferol (VITAMIN D3) 125 MCG (5000 UT) CAPS Take 5,000 Units by mouth daily.    [provider]  levothyroxine (SYNTHROID) 100 MCG tablet Take 1 tablet (100 mcg total) by mouth daily. 07/10/22   Dwyane Dee,  Vicenta Aly, MD  predniSONE (DELTASONE) 5 MG tablet Take 1 tablet (5 mg total) by mouth daily with breakfast. 07/18/22   Elayne Snare, MD  rosuvastatin (CRESTOR) 20 MG tablet Take 1 tablet (20 mg total) by mouth daily. 02/09/22   Freada Bergeron, MD  Testosterone 30 MG/ACT SOLN DISPENSE LIQUID INTO APPLICATION CUP, APPLY IN THE ARMPITS, ONCE  ON EACH SIDE 06/25/22   Elayne Snare, MD    Physical Exam    Vital Signs:  Jermaine Soto does not have vital signs available for review today.  Given telephonic nature of communication, physical exam is limited. AAOx3. NAD. Normal affect.  Speech and respirations are unlabored.  Accessory Clinical Findings    None  Assessment & Plan    1.  Preoperative Cardiovascular Risk Assessment: According to the Revised Cardiac Risk Index (RCRI), his Perioperative Risk of Major Cardiac Event is (%): 0.4. His Functional Capacity in METs is: 7.59 according to the Duke Activity Status Index (DASI). The patient is doing well from a cardiac perspective. Therefore, based on ACC/AHA guidelines, the patient would be at acceptable risk for the planned procedure without further cardiovascular testing.   The patient was advised that if he develops new symptoms prior to surgery to contact our office to arrange for a follow-up visit, and he verbalized understanding.  Per office protocol, patient can hold Eliquis for 2 days prior to procedure.   Patient will not need bridging with Lovenox (enoxaparin) around procedure.  A copy of this note will be routed to requesting surgeon.  Time:   Today, I have spent 7 minutes with the patient with telehealth technology discussing medical history, symptoms, and management plan.    Emmaline Life, NP-C  09/03/2022, 9:17 AM [address], [address] Office [phone] Fax [phone]

## 2022-09-03 ENCOUNTER — Ambulatory Visit: Payer: Medicare HMO | Attending: Cardiovascular Disease | Admitting: Nurse Practitioner

## 2022-09-03 ENCOUNTER — Telehealth: Payer: Self-pay

## 2022-09-03 ENCOUNTER — Encounter: Payer: Self-pay | Admitting: Nurse Practitioner

## 2022-09-03 DIAGNOSIS — Z01810 Encounter for preprocedural cardiovascular examination: Secondary | ICD-10-CM | POA: Diagnosis not present

## 2022-09-03 NOTE — Telephone Encounter (Signed)
Called patient to schedule Medicare Annual Wellness Visit (AWV). Unable to reach patient.  Last date of AWV: eligible for AWV-I as of 08/09/20  Please schedule an appointment at any time with [facility].    Norton Blizzard, [place] (AAMA)  [facility] [phone]

## 2022-09-10 DIAGNOSIS — R69 Illness, unspecified: Secondary | ICD-10-CM | POA: Diagnosis not present

## 2022-09-26 DIAGNOSIS — K5730 Diverticulosis of large intestine without perforation or abscess without bleeding: Secondary | ICD-10-CM | POA: Diagnosis not present

## 2022-09-26 DIAGNOSIS — Z09 Encounter for follow-up examination after completed treatment for conditions other than malignant neoplasm: Secondary | ICD-10-CM | POA: Diagnosis not present

## 2022-09-26 DIAGNOSIS — Z86010 Personal history of colonic polyps: Secondary | ICD-10-CM | POA: Diagnosis not present

## 2022-10-01 DIAGNOSIS — H35372 Puckering of macula, left eye: Secondary | ICD-10-CM | POA: Diagnosis not present

## 2022-10-01 DIAGNOSIS — H353132 Nonexudative age-related macular degeneration, bilateral, intermediate dry stage: Secondary | ICD-10-CM | POA: Diagnosis not present

## 2022-10-10 ENCOUNTER — Encounter: Payer: Self-pay | Admitting: Internal Medicine

## 2022-10-10 ENCOUNTER — Ambulatory Visit (INDEPENDENT_AMBULATORY_CARE_PROVIDER_SITE_OTHER): Payer: Medicare HMO | Admitting: Internal Medicine

## 2022-10-10 VITALS — BP 138/86 | HR 78 | Temp 97.7°F | Ht 70.0 in | Wt 228.0 lb

## 2022-10-10 DIAGNOSIS — R7301 Impaired fasting glucose: Secondary | ICD-10-CM

## 2022-10-10 DIAGNOSIS — R69 Illness, unspecified: Secondary | ICD-10-CM | POA: Diagnosis not present

## 2022-10-10 LAB — POCT GLYCOSYLATED HEMOGLOBIN (HGB A1C): HbA1c POC (<> result, manual entry): 6 % (ref 4.0–5.6)

## 2022-10-10 NOTE — Patient Instructions (Addendum)
Your HgA1c is 6.0 today which is still pre-diabetes but better than last time.  Work on watching carbohydrates in food and drink to help avoid getting diabetes.

## 2022-10-10 NOTE — Progress Notes (Signed)
   Subjective:   Patient ID: Jermaine Soto, male    DOB: 1946-02-15, 77 y.o.   MRN: [id number]  HPI The patient is a 77 YO man coming in for follow up sugars. Has been on chronic steroids for last year or so due to hypopituitarism.   Review of Systems  Constitutional: Negative.   HENT: Negative.    Eyes: Negative.   Respiratory:  Negative for cough, chest tightness and shortness of breath.   Cardiovascular:  Negative for chest pain, palpitations and leg swelling.  Gastrointestinal:  Negative for abdominal distention, abdominal pain, constipation, diarrhea, nausea and vomiting.  Musculoskeletal: Negative.   Skin: Negative.   Neurological: Negative.   Psychiatric/Behavioral: Negative.      Objective:  Physical Exam Constitutional:      Appearance: He is well-developed.  HENT:     Head: Normocephalic and atraumatic.  Cardiovascular:     Rate and Rhythm: Normal rate and regular rhythm.  Pulmonary:     Effort: Pulmonary effort is normal. No respiratory distress.     Breath sounds: Normal breath sounds. No wheezing or rales.  Abdominal:     General: Bowel sounds are normal. There is no distension.     Palpations: Abdomen is soft.     Tenderness: There is no abdominal tenderness. There is no rebound.  Musculoskeletal:     Cervical back: Normal range of motion.  Skin:    General: Skin is warm and dry.  Neurological:     Mental Status: He is alert and oriented to person, place, and time.     Coordination: Coordination normal.     Vitals:   10/10/22 0817  BP: 138/86  Pulse: 78  Temp: 97.7 F (36.5 C)  TempSrc: Oral  SpO2: 99%  Weight: 228 lb (103.4 kg)  Height: 5\' 10"  (1.778 m)    Assessment & Plan:

## 2022-10-10 NOTE — Assessment & Plan Note (Signed)
POC HgA1c done today and 6.0. He has made diet changes. Encouraged to continue with those. Recheck in 6 months. Due to chronic steroids he is at high risk for progression and will need intermittent monitoring.

## 2022-10-13 NOTE — H&P (View-Only) (Signed)
Cardiology Office Note:    Date:  10/22/2022   ID:  Jermaine Soto, DOB 09/20/1945, MRN [id number]  PCP:  Crawford, Elizabeth A, MD   [facility]  Cardiologist:  Dezmon Conover E Taletha Twiford, MD  Advanced Practice Provider:  No care team member to display Electrophysiologist:  None   Referring MD: Murray, Laura Woodruff,*    History of Present Illness:    Jermaine Soto is a 76 y.o. male with a hx of hypothyroidism and Afib who presents to clinic for follow-up.  Patient initially seen on 04/06/20, after he was noted to be in Afib in the office with his PCP. He was started on apixaban for AC. He was also notably losing significant amount of weight in the setting of hyperthyroidism (TSH 0.088) from supratherapeutic doses of synthroid. We decreased his synthroid to 37.5mcg at that time. TTE showed LVEF 50-55%, no WMA, mild RAE, normal LA, no significant valvular disease.   . He presented to [facility] on 06/01/20 with hyponatremia detected on out-patient lab work with Na 125. Thought may be due to poor po intake as glucose normal, TSH improved, no GI symptoms. He was given IVF with improvement. Repeat Na 128.   He then re-presented to [facility] on 06/15/20 with left gaze preference and right sided neglect and global aphasia. MRI was negative. He was found to be profoundly hyponatremic and other work-up including MRI brain, CT angiogram and EEG negative. Urine studies consistent with SIADH. Put on 2L restriction with improvement of symptoms. Discharged home on 06/20/20.   Last seen in clinic on 04/2022 where he was doing well from a CV standpoint.  Today, the patient overall feels okay. No chest pain, SOB, orthopnea, PND, or LE edema. Reports he was unaware he was back in Afib as he has been asymptomatic.  Has been taking medications as prescribed. TSH 0.04 but free T4 is normal. No recent medication changes or illnesses. States he may have missed two doses of apixaban in the past 3 weeks.   Past  Medical History:  Diagnosis Date   Arthritis    hands    Hypothyroidism     Past Surgical History:  Procedure Laterality Date   IR ANGIO INTRA EXTRACRAN SEL COM CAROTID INNOMINATE BILAT MOD SED  06/15/2020   IR ANGIO VERTEBRAL SEL VERTEBRAL BILAT MOD SED  06/15/2020   RADIOLOGY WITH ANESTHESIA N/A 06/15/2020   Procedure: IR WITH ANESTHESIA;  Surgeon: Radiologist, Medication, MD;  Location: [facility] OR;  Service: Radiology;  Laterality: N/A;   ROBOT ASSISTED LAPAROSCOPIC RADICAL PROSTATECTOMY N/A 12/24/2012   Procedure: ROBOTIC ASSISTED LAPAROSCOPIC RADICAL PROSTATECTOMY;  Surgeon: David S Grapey, MD;  Location: [facility] ORS;  Service: Urology;  Laterality: N/A;    Current Medications: Current Meds  Medication Sig   acetaminophen (TYLENOL) 325 MG tablet Take 325 mg by mouth every 6 (six) hours as needed for mild pain, fever or headache.   apixaban (ELIQUIS) 5 MG TABS tablet Take 1 tablet (5 mg total) by mouth 2 (two) times daily.   Cholecalciferol (VITAMIN D3) 125 MCG (5000 UT) CAPS Take 5,000 Units by mouth daily.   levothyroxine (SYNTHROID) 100 MCG tablet Take 1 tablet (100 mcg total) by mouth daily.   metoprolol tartrate (LOPRESSOR) 25 MG tablet Take 0.5 tablets (12.5 mg total) by mouth 2 (two) times daily.   predniSONE (DELTASONE) 5 MG tablet Take 1 tablet (5 mg total) by mouth daily with breakfast.   rosuvastatin (CRESTOR) 20 MG tablet Take 1 tablet (20 mg   total) by mouth daily.   Testosterone 30 MG/ACT SOLN DISPENSE LIQUID INTO APPLICATION CUP, APPLY IN THE ARMPITS, ONCE ON EACH SIDE     Allergies:   Sulfa antibiotics and Tape   Social History   Socioeconomic History   Marital status: Married    Spouse name: Not on file   Number of children: Not on file   Years of education: Not on file   Highest education level: Not on file  Occupational History   Not on file  Tobacco Use   Smoking status: Never   Smokeless tobacco: Never  Substance and Sexual Activity   Alcohol use: Yes    Comment:  rare   Drug use: No   Sexual activity: Not on file  Other Topics Concern   Not on file  Social History Narrative   Not on file   Social Determinants of Health   Financial Resource Strain: Not on file  Food Insecurity: Not on file  Transportation Needs: Not on file  Physical Activity: Not on file  Stress: Not on file  Social Connections: Not on file     Family History: The patient's family history is negative for Thyroid disease.  ROS:   Please see the history of present illness.    Review of Systems  Constitutional:  Negative for chills, fever and malaise/fatigue.  HENT:  Positive for hearing loss.   Respiratory:  Negative for shortness of breath.   Cardiovascular:  Negative for chest pain, palpitations, orthopnea, claudication, leg swelling and PND.  Gastrointestinal:  Negative for blood in stool, melena, nausea and vomiting.  Genitourinary:  Negative for dysuria and flank pain.  Musculoskeletal:  Negative for falls.  Neurological:  Negative for dizziness and loss of consciousness.  Endo/Heme/Allergies:  Negative for polydipsia. Bruises/bleeds easily.  Psychiatric/Behavioral:  Negative for substance abuse.     EKGs/Labs/Other Studies Reviewed:    The following studies were reviewed today:  Cardiac Studies & Procedures       ECHOCARDIOGRAM  ECHOCARDIOGRAM COMPLETE 06/16/2020  Narrative ECHOCARDIOGRAM REPORT    Patient Name:   Jermaine Soto Date of Exam: 06/16/2020 Medical Rec #:  [id number]   Height:       70.0 in Accession #:    [id number]  Weight:       181.7 lb Date of Birth:  11/03/1945    BSA:          2.004 m Patient Age:    74 years    BP:           148/70 mmHg Patient Gender: M           HR:           75 bpm. Exam Location:  Inpatient  Procedure: 2D Echo, Cardiac Doppler and Color Doppler  Indications:    CVA  History:        Patient has prior history of Echocardiogram examinations, most recent 04/28/2020. Stroke, Arrythmias:Atrial  Fibrillation; Signs/Symptoms:Altered Mental Status.  Sonographer:    Brooke Strickland Referring Phys: [id number] KAREN J KIRBY-GRAHAM   Sonographer Comments: Suboptimal parasternal window, suboptimal apical window and echo performed with patient supine and on artificial respirator. Image acquisition challenging due to uncooperative patient. IMPRESSIONS   1. Left ventricular ejection fraction, by estimation, is 55 to 60%. The left ventricle has normal function. The left ventricle has no regional wall motion abnormalities. There is mild concentric left ventricular hypertrophy. Left ventricular diastolic parameters are consistent with Grade I diastolic dysfunction (impaired relaxation). 2. Right ventricular   systolic function is normal. The right ventricular size is normal. There is normal pulmonary artery systolic pressure. The estimated right ventricular systolic pressure is 27.2 mmHg. 3. The mitral valve is normal in structure. No evidence of mitral valve regurgitation. No evidence of mitral stenosis. Moderate mitral annular calcification. 4. The aortic valve is normal in structure. Aortic valve regurgitation is not visualized. Mild to moderate aortic valve sclerosis/calcification is present, without any evidence of aortic stenosis. 5. The inferior vena cava is normal in size with greater than 50% respiratory variability, suggesting right atrial pressure of 3 mmHg.  Conclusion(s)/Recommendation(s): No intracardiac source of embolism detected on this transthoracic study. A transesophageal echocardiogram is recommended to exclude cardiac source of embolism if clinically indicated.  FINDINGS Left Ventricle: Left ventricular ejection fraction, by estimation, is 55 to 60%. The left ventricle has normal function. The left ventricle has no regional wall motion abnormalities. The left ventricular internal cavity size was normal in size. There is mild concentric left ventricular hypertrophy. Left ventricular  diastolic parameters are consistent with Grade I diastolic dysfunction (impaired relaxation). Normal left ventricular filling pressure.  Right Ventricle: The right ventricular size is normal. No increase in right ventricular wall thickness. Right ventricular systolic function is normal. There is normal pulmonary artery systolic pressure. The tricuspid regurgitant velocity is 2.46 m/s, and with an assumed right atrial pressure of 3 mmHg, the estimated right ventricular systolic pressure is 27.2 mmHg.  Left Atrium: Left atrial size was normal in size.  Right Atrium: Right atrial size was normal in size.  Pericardium: There is no evidence of pericardial effusion.  Mitral Valve: The mitral valve is normal in structure. Moderate mitral annular calcification. No evidence of mitral valve regurgitation. No evidence of mitral valve stenosis.  Tricuspid Valve: The tricuspid valve is normal in structure. Tricuspid valve regurgitation is mild . No evidence of tricuspid stenosis.  Aortic Valve: The aortic valve is normal in structure. Aortic valve regurgitation is not visualized. Mild to moderate aortic valve sclerosis/calcification is present, without any evidence of aortic stenosis.  Pulmonic Valve: The pulmonic valve was normal in structure. Pulmonic valve regurgitation is not visualized. No evidence of pulmonic stenosis.  Aorta: The aortic root is normal in size and structure.  Venous: The inferior vena cava is normal in size with greater than 50% respiratory variability, suggesting right atrial pressure of 3 mmHg.  IAS/Shunts: No atrial level shunt detected by color flow Doppler.   LEFT VENTRICLE PLAX 2D LVIDd:         4.20 cm  Diastology LVIDs:         3.00 cm  LV e' medial:    5.77 cm/s LV PW:         1.10 cm  LV E/e' medial:  10.2 LV IVS:        1.10 cm  LV e' lateral:   5.55 cm/s LVOT diam:     1.90 cm  LV E/e' lateral: 10.6 LV SV:         41 LV SV Index:   20 LVOT Area:     2.84  cm   RIGHT VENTRICLE RV Basal diam:  3.20 cm RV S prime:     13.30 cm/s TAPSE (M-mode): 2.4 cm  LEFT ATRIUM           Index       RIGHT ATRIUM           Index LA diam:      2.90 cm 1.45 cm/m    RA Area:     15.00 cm LA Vol (A4C): 31.9 ml 15.92 ml/m RA Volume:   33.40 ml  16.67 ml/m AORTIC VALVE LVOT Vmax:   63.30 cm/s LVOT Vmean:  43.200 cm/s LVOT VTI:    0.143 m  AORTA Ao Root diam: 3.20 cm  MITRAL VALVE               TRICUSPID VALVE MV Area (PHT): 5.27 cm    TR Peak grad:   24.2 mmHg MV Decel Time: 144 msec    TR Vmax:        246.00 cm/s MV E velocity: 58.70 cm/s MV A velocity: 64.30 cm/s  SHUNTS MV E/A ratio:  0.91        Systemic VTI:  0.14 m Systemic Diam: 1.90 cm  Katarina Nelson MD Electronically signed by Katarina Nelson MD Signature Date/Time: 06/16/2020/11:41:05 AM    Final              EKG:  EKG is personally reviewed. 10/22/22: Afib with HR 113-personally reviewed  Recent Labs: 08/06/2022: BUN 21; Creatinine, Ser 1.06; Hemoglobin 14.5; Platelets 240.0; Potassium 4.1; Sodium 139; TSH 0.04   Recent Lipid Panel    Component Value Date/Time   CHOL 132 07/09/2022 1358   CHOL 135 10/11/2021 0910   TRIG 179.0 (H) 07/09/2022 1358   HDL 59.10 07/09/2022 1358   HDL 69 10/11/2021 0910   CHOLHDL 2 07/09/2022 1358   VLDL 35.8 07/09/2022 1358   LDLCALC 37 07/09/2022 1358   LDLCALC 48 10/11/2021 0910     Risk Assessment/Calculations:    CHA2DS2-VASc Score = 3  This indicates a 3.2% annual risk of stroke. The patient's score is based upon: CHF History: 0 HTN History: 0 Diabetes History: 0 Stroke History: 0 Vascular Disease History: 1 Age Score: 2 Gender Score: 0     Physical Exam:    VS:  BP 130/80   Pulse (!) 113   Ht 5' 10" (1.778 m)   Wt 226 lb 9.6 oz (102.8 kg)   SpO2 96%   BMI 32.51 kg/m     Wt Readings from Last 3 Encounters:  10/22/22 226 lb 9.6 oz (102.8 kg)  10/10/22 228 lb (103.4 kg)  08/09/22 230 lb 6.4 oz (104.5 kg)      GEN:  Well nourished, well developed in no acute distress HEENT: Normal NECK: No JVD; No carotid bruits CARDIAC: Irregularly irregular, no murmurs RESPIRATORY:  CTAB, no wheezes ABDOMEN: Soft, non-tender, non-distended MUSCULOSKELETAL:  Trace-1+ LE edema SKIN: Warm and dry NEUROLOGIC:  Alert and oriented x 3 PSYCHIATRIC:  Normal affect   ASSESSMENT:    1. Paroxysmal atrial fibrillation (HCC)   2. Hyperlipidemia, unspecified hyperlipidemia type   3. Coronary artery calcification   4. Hyponatremia   5. Hypopituitarism (HCC)   6. Secondary hypercoagulable state (HCC)   7. Weight loss     PLAN:    In order of problems listed above:  #Atrial fibrillation: CHADs-vasc 3 for age and coronary/aortic atherosclerosis noted on CT chest. Developed in the setting of pituitary insufficinecy. Was started on apixaban for AC. Metop stopped iin the setting of hypotension but had pituitary insufficiency at that time with significant weight loss. Now back in Afib today. Has missed doses of AC so will plan for DCCV in 3 weeks. -Plan for DCCV in 3 weeks (has missed 2 doses of apixaban in the past month) -Continue eliquis 5mg BID for anticoagulation -Start metop 12.5mg BID  -TTE with LVEF 50-55%,   no regional WMA   #Pituitary Insufficiency: #Central hypothyroidism #SIADH #Weight loss: Patient initially presented with unintentional wight loss of 40lbs with labs consistent with severely low TSH. Also with hyponatremia and significant daytime somnolence with concern for underlying Endocrinopathy. Was referred to Dr. Eillison and was diagnosed with pituitary insufficiency. Now on prednisone, synthroid, testosterone and fluid restriction with remarkable improvement. Energy levels significantly improved. Gained weight. Na well controlled. No recurrent Afib. -Continue follow-up with Endocrine as scheduled -Continue synthroid, prednisone and fluid restriction   #Coronary Calcification: Noted on CT  chest. Asymptomatic with no exertional chest pain, SOB or faitgue. LDL at goal of47.5 on 10/03/20. -Continue crestor 20mg daily -LDL at goal <70   #Hyponatremia #AMS, stroke-like symptoms: Patient admitted in early 06/2020 with left gaze deviation and right sided neglect as well as AMS. MRI head without stroke. CTA without significant vascular disease. Did not received tPA as on systemic AC. EEG without seizures. Thought symptoms driven by hyponatremia with Na 117. Urine studies consistent with SIADH. Currently feeling better with no recurrence of symptoms. On 1.5L fluid restriction. -Follow-up with Endocrinology as scheduled   #Weight loss>IMPROVED: Significantly improved. Patient with profound weight loss thought to be driven by iatrogenic hyperthyroidism and pituitary insufficiency. CT chest/abd/pelvis without evidence of mass.  Now significantly improved with treatment of pituitary insufficiency. -Follow-up with endocrine as above -Fortunately, no evidence of malignancy on CT c/a/p  Follow up in 6 months.  Medication Adjustments/Labs and Tests Ordered: Current medicines are reviewed at length with the patient today.  Concerns regarding medicines are outlined above.   Orders Placed This Encounter  Procedures   CBC w/Diff   Basic metabolic panel   EKG 12-Lead   Meds ordered this encounter  Medications   metoprolol tartrate (LOPRESSOR) 25 MG tablet    Sig: Take 0.5 tablets (12.5 mg total) by mouth 2 (two) times daily.    Dispense:  90 tablet    Refill:  3   Patient Instructions  Medication Instructions:   START TAKING METOPROLOL TARTRATE 12.5 MG BY MOUTH TWICE DAILY  *If you need a refill on your cardiac medications before your next appointment, please call your pharmacy*    Lab Work:  PRE-PROCEDURE LABS ON MONDAY 11/12/22 HERE IN THE OFFICE--BMET AND CBC W DIFF  If you have labs (blood work) drawn today and your tests are completely normal, you will receive your results  only by: MyChart Message (if you have MyChart) OR A paper copy in the mail If you have any lab test that is abnormal or we need to change your treatment, we will call you to review the results.    Testing/Procedures:      Dear Jermaine Soto  You are scheduled for a Cardioversion on Tuesday, June 4 with Dr. SCHUMANN.  Please arrive at the [facility] Tower (Main Entrance A) at [facility]: [address] [place], [place] [address] at 6:30 AM (This time is 6:30 AM hour(s) before your procedure to ensure your preparation). Free valet parking service is available. You will check in at ADMITTING. The support person will be asked to wait in the waiting room.  It is OK to have someone drop you off and come back when you are ready to be discharged.      DIET:  Nothing to eat or drink after midnight except a sip of water with medications (see medication instructions below)  MEDICATION INSTRUCTIONS: !!IF ANY NEW MEDICATIONS ARE STARTED AFTER TODAY, PLEASE NOTIFY YOUR PROVIDER AS SOON   AS POSSIBLE!!   PLEASE CONTINUE TAKING YOUR ELIQUIS WITH NO SKIPPED DOSES  Continue taking your anticoagulant (blood thinner): Apixaban (Eliquis).  You will need to continue this after your procedure until you are told by your provider that it is safe to stop.    LABS:   Come to the lab at [facility] at [address] between the hours of 8:00 am and 4:30 pm. You do NOT have to be fasting.  COME FOR LAB ON 11/12/22   FYI:  For your safety, and to allow us to monitor your vital signs accurately during the surgery/procedure we request: If you have artificial nails, gel coating, SNS etc, please have those removed prior to your surgery/procedure. Not having the nail coverings /polish removed may result in cancellation or delay of your surgery/procedure.  You must have a responsible person to drive you home and stay in the waiting area during your procedure. Failure to do so could result in  cancellation.  Bring your insurance cards.  *Special Note: Every effort is made to have your procedure done on time. Occasionally there are emergencies that occur at the hospital that may cause delays. Please be patient if a delay does occur.       Follow-Up:  1.)  2 WEEKS FOR NURSE VISIT EKG--TO CONFIRM IF STILL IN AFIB OR NOT  2.)  6 WEEKS WITH AN EXTENDER IN THE OFFICE     Signed, Dorinne Graeff E Jaison Petraglia, MD  10/22/2022 9:04 AM    [facility] 

## 2022-10-13 NOTE — Progress Notes (Signed)
Cardiology Office Note:    Date:  10/22/2022   ID:  Jermaine Soto, DOB 15-Apr-1946, MRN [id number]  PCP:  Myrlene Broker, MD   [facility]  Cardiologist:  Meriam Sprague, MD  Advanced Practice Provider:  No care team member to display Electrophysiologist:  None   Referring MD: Olive Bass,*    History of Present Illness:    Jermaine Soto is a 77 y.o. male with a hx of hypothyroidism and Afib who presents to clinic for follow-up.  Patient initially seen on 04/06/20, after he was noted to be in Afib in the office with his PCP. He was started on apixaban for [facility]. He was also notably losing significant amount of weight in the setting of hyperthyroidism (TSH 0.088) from supratherapeutic doses of synthroid. We decreased his synthroid to 37.28mcg at that time. TTE showed LVEF 50-55%, no WMA, mild RAE, normal LA, no significant valvular disease.   Marland Kitchen He presented to [facility] on 06/01/20 with hyponatremia detected on out-patient lab work with Na 125. Thought may be due to poor po intake as glucose normal, TSH improved, no GI symptoms. He was given IVF with improvement. Repeat Na 128.   He then re-presented to [facility] on 06/15/20 with left gaze preference and right sided neglect and global aphasia. MRI was negative. He was found to be profoundly hyponatremic and other work-up including MRI brain, CT angiogram and EEG negative. Urine studies consistent with SIADH. Put on 2L restriction with improvement of symptoms. Discharged home on 06/20/20.   Last seen in clinic on 04/2022 where he was doing well from a CV standpoint.  Today, the patient overall feels okay. No chest pain, SOB, orthopnea, PND, or LE edema. Reports he was unaware he was back in Afib as he has been asymptomatic.  Has been taking medications as prescribed. TSH 0.04 but free T4 is normal. No recent medication changes or illnesses. States he may have missed two doses of apixaban in the past 3 weeks.   Past  Medical History:  Diagnosis Date   Arthritis    hands    Hypothyroidism     Past Surgical History:  Procedure Laterality Date   IR ANGIO INTRA EXTRACRAN SEL COM CAROTID INNOMINATE BILAT MOD SED  06/15/2020   IR ANGIO VERTEBRAL SEL VERTEBRAL BILAT MOD SED  06/15/2020   RADIOLOGY WITH ANESTHESIA N/A 06/15/2020   Procedure: IR WITH ANESTHESIA;  Surgeon: Radiologist, Medication, MD;  Location: [facility] OR;  Service: Radiology;  Laterality: N/A;   ROBOT ASSISTED LAPAROSCOPIC RADICAL PROSTATECTOMY N/A 12/24/2012   Procedure: ROBOTIC ASSISTED LAPAROSCOPIC RADICAL PROSTATECTOMY;  Surgeon: Valetta Fuller, MD;  Location: [facility] ORS;  Service: Urology;  Laterality: N/A;    Current Medications: Current Meds  Medication Sig   acetaminophen (TYLENOL) 325 MG tablet Take 325 mg by mouth every 6 (six) hours as needed for mild pain, fever or headache.   apixaban (ELIQUIS) 5 MG TABS tablet Take 1 tablet (5 mg total) by mouth 2 (two) times daily.   Cholecalciferol (VITAMIN D3) 125 MCG (5000 UT) CAPS Take 5,000 Units by mouth daily.   levothyroxine (SYNTHROID) 100 MCG tablet Take 1 tablet (100 mcg total) by mouth daily.   metoprolol tartrate (LOPRESSOR) 25 MG tablet Take 0.5 tablets (12.5 mg total) by mouth 2 (two) times daily.   predniSONE (DELTASONE) 5 MG tablet Take 1 tablet (5 mg total) by mouth daily with breakfast.   rosuvastatin (CRESTOR) 20 MG tablet Take 1 tablet (20 mg  total) by mouth daily.   Testosterone 30 MG/ACT SOLN DISPENSE LIQUID INTO APPLICATION CUP, APPLY IN THE ARMPITS, ONCE ON EACH SIDE     Allergies:   Sulfa antibiotics and Tape   Social History   Socioeconomic History   Marital status: Married    Spouse name: Not on file   Number of children: Not on file   Years of education: Not on file   Highest education level: Not on file  Occupational History   Not on file  Tobacco Use   Smoking status: Never   Smokeless tobacco: Never  Substance and Sexual Activity   Alcohol use: Yes    Comment:  rare   Drug use: No   Sexual activity: Not on file  Other Topics Concern   Not on file  Social History Narrative   Not on file   Social Determinants of Health   Financial Resource Strain: Not on file  Food Insecurity: Not on file  Transportation Needs: Not on file  Physical Activity: Not on file  Stress: Not on file  Social Connections: Not on file     Family History: The patient's family history is negative for Thyroid disease.  ROS:   Please see the history of present illness.    Review of Systems  Constitutional:  Negative for chills, fever and malaise/fatigue.  HENT:  Positive for hearing loss.   Respiratory:  Negative for shortness of breath.   Cardiovascular:  Negative for chest pain, palpitations, orthopnea, claudication, leg swelling and PND.  Gastrointestinal:  Negative for blood in stool, melena, nausea and vomiting.  Genitourinary:  Negative for dysuria and flank pain.  Musculoskeletal:  Negative for falls.  Neurological:  Negative for dizziness and loss of consciousness.  Endo/Heme/Allergies:  Negative for polydipsia. Bruises/bleeds easily.  Psychiatric/Behavioral:  Negative for substance abuse.     EKGs/Labs/Other Studies Reviewed:    The following studies were reviewed today:  Cardiac Studies & Procedures       ECHOCARDIOGRAM  ECHOCARDIOGRAM COMPLETE 06/16/2020  Narrative ECHOCARDIOGRAM REPORT    Patient Name:   Jermaine Soto Date of Exam: 06/16/2020 Medical Rec #:  [id number]   Height:       70.0 in Accession #:    [id number]  Weight:       181.7 lb Date of Birth:  10-Apr-1946    BSA:          2.004 m Patient Age:    74 years    BP:           148/70 mmHg Patient Gender: M           HR:           75 bpm. Exam Location:  Inpatient  Procedure: 2D Echo, Cardiac Doppler and Color Doppler  Indications:    CVA  History:        Patient has prior history of Echocardiogram examinations, most recent 04/28/2020. Stroke, Arrythmias:Atrial  Fibrillation; Signs/Symptoms:Altered Mental Status.  Sonographer:    Lavenia Atlas Referring Phys: [id number] [facility]   Sonographer Comments: Suboptimal parasternal window, suboptimal apical window and echo performed with patient supine and on artificial respirator. Image acquisition challenging due to uncooperative patient. IMPRESSIONS   1. Left ventricular ejection fraction, by estimation, is 55 to 60%. The left ventricle has normal function. The left ventricle has no regional wall motion abnormalities. There is mild concentric left ventricular hypertrophy. Left ventricular diastolic parameters are consistent with Grade I diastolic dysfunction (impaired relaxation). 2. Right ventricular  systolic function is normal. The right ventricular size is normal. There is normal pulmonary artery systolic pressure. The estimated right ventricular systolic pressure is 27.2 mmHg. 3. The mitral valve is normal in structure. No evidence of mitral valve regurgitation. No evidence of mitral stenosis. Moderate mitral annular calcification. 4. The aortic valve is normal in structure. Aortic valve regurgitation is not visualized. Mild to moderate aortic valve sclerosis/calcification is present, without any evidence of aortic stenosis. 5. The inferior vena cava is normal in size with greater than 50% respiratory variability, suggesting right atrial pressure of 3 mmHg.  Conclusion(s)/Recommendation(s): No intracardiac source of embolism detected on this transthoracic study. A transesophageal echocardiogram is recommended to exclude cardiac source of embolism if clinically indicated.  FINDINGS Left Ventricle: Left ventricular ejection fraction, by estimation, is 55 to 60%. The left ventricle has normal function. The left ventricle has no regional wall motion abnormalities. The left ventricular internal cavity size was normal in size. There is mild concentric left ventricular hypertrophy. Left ventricular  diastolic parameters are consistent with Grade I diastolic dysfunction (impaired relaxation). Normal left ventricular filling pressure.  Right Ventricle: The right ventricular size is normal. No increase in right ventricular wall thickness. Right ventricular systolic function is normal. There is normal pulmonary artery systolic pressure. The tricuspid regurgitant velocity is 2.46 m/s, and with an assumed right atrial pressure of 3 mmHg, the estimated right ventricular systolic pressure is 27.2 mmHg.  Left Atrium: Left atrial size was normal in size.  Right Atrium: Right atrial size was normal in size.  Pericardium: There is no evidence of pericardial effusion.  Mitral Valve: The mitral valve is normal in structure. Moderate mitral annular calcification. No evidence of mitral valve regurgitation. No evidence of mitral valve stenosis.  Tricuspid Valve: The tricuspid valve is normal in structure. Tricuspid valve regurgitation is mild . No evidence of tricuspid stenosis.  Aortic Valve: The aortic valve is normal in structure. Aortic valve regurgitation is not visualized. Mild to moderate aortic valve sclerosis/calcification is present, without any evidence of aortic stenosis.  Pulmonic Valve: The pulmonic valve was normal in structure. Pulmonic valve regurgitation is not visualized. No evidence of pulmonic stenosis.  Aorta: The aortic root is normal in size and structure.  Venous: The inferior vena cava is normal in size with greater than 50% respiratory variability, suggesting right atrial pressure of 3 mmHg.  IAS/Shunts: No atrial level shunt detected by color flow Doppler.   LEFT VENTRICLE PLAX 2D LVIDd:         4.20 cm  Diastology LVIDs:         3.00 cm  LV e' medial:    5.77 cm/s LV PW:         1.10 cm  LV E/e' medial:  10.2 LV IVS:        1.10 cm  LV e' lateral:   5.55 cm/s LVOT diam:     1.90 cm  LV E/e' lateral: 10.6 LV SV:         41 LV SV Index:   20 LVOT Area:     2.84  cm   RIGHT VENTRICLE RV Basal diam:  3.20 cm RV S prime:     13.30 cm/s TAPSE (M-mode): 2.4 cm  LEFT ATRIUM           Index       RIGHT ATRIUM           Index LA diam:      2.90 cm 1.45 cm/m  RA Area:     15.00 cm LA Vol (A4C): 31.9 ml 15.92 ml/m RA Volume:   33.40 ml  16.67 ml/m AORTIC VALVE LVOT Vmax:   63.30 cm/s LVOT Vmean:  43.200 cm/s LVOT VTI:    0.143 m  AORTA Ao Root diam: 3.20 cm  MITRAL VALVE               TRICUSPID VALVE MV Area (PHT): 5.27 cm    TR Peak grad:   24.2 mmHg MV Decel Time: 144 msec    TR Vmax:        246.00 cm/s MV E velocity: 58.70 cm/s MV A velocity: 64.30 cm/s  SHUNTS MV E/A ratio:  0.91        Systemic VTI:  0.14 m Systemic Diam: 1.90 cm  Tobias Alexander MD Electronically signed by Tobias Alexander MD Signature Date/Time: 06/16/2020/11:41:05 AM    Final              EKG:  EKG is personally reviewed. 10/22/22: Afib with HR 113-personally reviewed  Recent Labs: 08/06/2022: BUN 21; Creatinine, Ser 1.06; Hemoglobin 14.5; Platelets 240.0; Potassium 4.1; Sodium 139; TSH 0.04   Recent Lipid Panel    Component Value Date/Time   CHOL 132 07/09/2022 1358   CHOL 135 10/11/2021 0910   TRIG 179.0 (H) 07/09/2022 1358   HDL 59.10 07/09/2022 1358   HDL 69 10/11/2021 0910   CHOLHDL 2 07/09/2022 1358   VLDL 35.8 07/09/2022 1358   LDLCALC 37 07/09/2022 1358   LDLCALC 48 10/11/2021 0910     Risk Assessment/Calculations:    CHA2DS2-VASc Score = 3  This indicates a 3.2% annual risk of stroke. The patient's score is based upon: CHF History: 0 HTN History: 0 Diabetes History: 0 Stroke History: 0 Vascular Disease History: 1 Age Score: 2 Gender Score: 0     Physical Exam:    VS:  BP 130/80   Pulse (!) 113   Ht 5\' 10"  (1.778 m)   Wt 226 lb 9.6 oz (102.8 kg)   SpO2 96%   BMI 32.51 kg/m     Wt Readings from Last 3 Encounters:  10/22/22 226 lb 9.6 oz (102.8 kg)  10/10/22 228 lb (103.4 kg)  08/09/22 230 lb 6.4 oz (104.5 kg)      GEN:  Well nourished, well developed in no acute distress HEENT: Normal NECK: No JVD; No carotid bruits CARDIAC: Irregularly irregular, no murmurs RESPIRATORY:  CTAB, no wheezes ABDOMEN: Soft, non-tender, non-distended MUSCULOSKELETAL:  Trace-1+ LE edema SKIN: Warm and dry NEUROLOGIC:  Alert and oriented x 3 PSYCHIATRIC:  Normal affect   ASSESSMENT:    1. Paroxysmal atrial fibrillation (HCC)   2. Hyperlipidemia, unspecified hyperlipidemia type   3. Coronary artery calcification   4. Hyponatremia   5. Hypopituitarism (HCC)   6. Secondary hypercoagulable state (HCC)   7. Weight loss     PLAN:    In order of problems listed above:  #Atrial fibrillation: CHADs-vasc 3 for age and coronary/aortic atherosclerosis noted on CT chest. Developed in the setting of pituitary insufficinecy. Was started on apixaban for [facility]. Metop stopped iin the setting of hypotension but had pituitary insufficiency at that time with significant weight loss. Now back in Afib today. Has missed doses of AC so will plan for DCCV in 3 weeks. -Plan for DCCV in 3 weeks (has missed 2 doses of apixaban in the past month) -Continue eliquis 5mg  BID for anticoagulation -Start metop 12.5mg  BID  -TTE with LVEF 50-55%,  no regional WMA   #Pituitary Insufficiency: #Central hypothyroidism #SIADH #Weight loss: Patient initially presented with unintentional wight loss of 40lbs with labs consistent with severely low TSH. Also with hyponatremia and significant daytime somnolence with concern for underlying Endocrinopathy. Was referred to Dr. Jeanne Ivan and was diagnosed with pituitary insufficiency. Now on prednisone, synthroid, testosterone and fluid restriction with remarkable improvement. Energy levels significantly improved. Gained weight. Na well controlled. No recurrent Afib. -Continue follow-up with Endocrine as scheduled -Continue synthroid, prednisone and fluid restriction   #Coronary Calcification: Noted on CT  chest. Asymptomatic with no exertional chest pain, SOB or faitgue. LDL at goal of47.5 on 10/03/20. -Continue crestor 20mg  daily -LDL at goal <70   #Hyponatremia #AMS, stroke-like symptoms: Patient admitted in early 06/2020 with left gaze deviation and right sided neglect as well as AMS. MRI head without stroke. CTA without significant vascular disease. Did not received tPA as on systemic AC. EEG without seizures. Thought symptoms driven by hyponatremia with Na 117. Urine studies consistent with SIADH. Currently feeling better with no recurrence of symptoms. On 1.5L fluid restriction. -Follow-up with Endocrinology as scheduled   #Weight loss>IMPROVED: Significantly improved. Patient with profound weight loss thought to be driven by iatrogenic hyperthyroidism and pituitary insufficiency. CT chest/abd/pelvis without evidence of mass.  Now significantly improved with treatment of pituitary insufficiency. -Follow-up with endocrine as above -Fortunately, no evidence of malignancy on CT c/a/p  Follow up in 6 months.  Medication Adjustments/Labs and Tests Ordered: Current medicines are reviewed at length with the patient today.  Concerns regarding medicines are outlined above.   Orders Placed This Encounter  Procedures   CBC w/Diff   Basic metabolic panel   EKG 12-Lead   Meds ordered this encounter  Medications   metoprolol tartrate (LOPRESSOR) 25 MG tablet    Sig: Take 0.5 tablets (12.5 mg total) by mouth 2 (two) times daily.    Dispense:  90 tablet    Refill:  3   Patient Instructions  Medication Instructions:   START TAKING METOPROLOL TARTRATE 12.5 MG BY MOUTH TWICE DAILY  *If you need a refill on your cardiac medications before your next appointment, please call your pharmacy*    Lab Work:  PRE-PROCEDURE LABS ON [facility] 11/12/22 HERE IN THE OFFICE--BMET AND CBC W DIFF  If you have labs (blood work) drawn today and your tests are completely normal, you will receive your results  only by: MyChart Message (if you have MyChart) OR A paper copy in the mail If you have any lab test that is abnormal or we need to change your treatment, we will call you to review the results.    Testing/Procedures:      Dear Jermaine Soto  You are scheduled for a Cardioversion on Tuesday, June 4 with Dr. Bjorn Pippin.  Please arrive at the [facility] (Main Entrance A) at [facility]: [address] [place], [place] [address] at 6:30 AM (This time is 6:30 AM hour(s) before your procedure to ensure your preparation). Free valet parking service is available. You will check in at ADMITTING. The support person will be asked to wait in the waiting room.  It is OK to have someone drop you off and come back when you are ready to be discharged.      DIET:  Nothing to eat or drink after midnight except a sip of water with medications (see medication instructions below)  MEDICATION INSTRUCTIONS: !!IF ANY NEW MEDICATIONS ARE STARTED AFTER TODAY, PLEASE NOTIFY YOUR PROVIDER AS SOON  AS POSSIBLE!!   PLEASE CONTINUE TAKING YOUR ELIQUIS WITH NO SKIPPED DOSES  Continue taking your anticoagulant (blood thinner): Apixaban (Eliquis).  You will need to continue this after your procedure until you are told by your provider that it is safe to stop.    LABS:   Come to the lab at [facility] at [address] between the hours of 8:00 am and 4:30 pm. You do NOT have to be fasting.  COME FOR LAB ON 11/12/22   FYI:  For your safety, and to allow [place] to monitor your vital signs accurately during the surgery/procedure we request: If you have artificial nails, gel coating, SNS etc, please have those removed prior to your surgery/procedure. Not having the nail coverings /polish removed may result in cancellation or delay of your surgery/procedure.  You must have a responsible person to drive you home and stay in the waiting area during your procedure. Failure to do so could result in  cancellation.  Bring your insurance cards.  *Special Note: Every effort is made to have your procedure done on time. Occasionally there are emergencies that occur at the hospital that may cause delays. Please be patient if a delay does occur.       Follow-Up:  1.)  2 WEEKS FOR NURSE VISIT EKG--TO CONFIRM IF STILL IN AFIB OR NOT  2.)  6 WEEKS WITH AN EXTENDER IN THE OFFICE     Signed, Meriam Sprague, MD  10/22/2022 9:04 AM    [facility]

## 2022-10-22 ENCOUNTER — Ambulatory Visit: Payer: Medicare HMO | Attending: Cardiology | Admitting: Cardiology

## 2022-10-22 ENCOUNTER — Encounter: Payer: Self-pay | Admitting: Cardiology

## 2022-10-22 VITALS — BP 130/80 | HR 113 | Ht 70.0 in | Wt 226.6 lb

## 2022-10-22 DIAGNOSIS — I2510 Atherosclerotic heart disease of native coronary artery without angina pectoris: Secondary | ICD-10-CM

## 2022-10-22 DIAGNOSIS — I2584 Coronary atherosclerosis due to calcified coronary lesion: Secondary | ICD-10-CM

## 2022-10-22 DIAGNOSIS — E785 Hyperlipidemia, unspecified: Secondary | ICD-10-CM

## 2022-10-22 DIAGNOSIS — D6869 Other thrombophilia: Secondary | ICD-10-CM

## 2022-10-22 DIAGNOSIS — E871 Hypo-osmolality and hyponatremia: Secondary | ICD-10-CM | POA: Diagnosis not present

## 2022-10-22 DIAGNOSIS — R634 Abnormal weight loss: Secondary | ICD-10-CM | POA: Diagnosis not present

## 2022-10-22 DIAGNOSIS — I480 Paroxysmal atrial fibrillation: Secondary | ICD-10-CM

## 2022-10-22 DIAGNOSIS — E230 Hypopituitarism: Secondary | ICD-10-CM

## 2022-10-22 MED ORDER — METOPROLOL TARTRATE 25 MG PO TABS
12.5000 mg | ORAL_TABLET | Freq: Two times a day (BID) | ORAL | 3 refills | Status: DC
Start: 2022-10-22 — End: 2022-12-03

## 2022-10-22 NOTE — Patient Instructions (Signed)
Medication Instructions:   START TAKING METOPROLOL TARTRATE 12.5 MG BY MOUTH TWICE DAILY  *If you need a refill on your cardiac medications before your next appointment, please call your pharmacy*    Lab Work:  PRE-PROCEDURE LABS ON [facility] 11/12/22 HERE IN THE OFFICE--BMET AND CBC W DIFF  If you have labs (blood work) drawn today and your tests are completely normal, you will receive your results only by: MyChart Message (if you have MyChart) OR A paper copy in the mail If you have any lab test that is abnormal or we need to change your treatment, we will call you to review the results.    Testing/Procedures:      Dear Jermaine Soto  You are scheduled for a Cardioversion on Tuesday, June 4 with Dr. Bjorn Pippin.  Please arrive at the [facility] (Main Entrance A) at [facility]: [address] [place], [place] [address] at 6:30 AM (This time is 6:30 AM hour(s) before your procedure to ensure your preparation). Free valet parking service is available. You will check in at ADMITTING. The support person will be asked to wait in the waiting room.  It is OK to have someone drop you off and come back when you are ready to be discharged.      DIET:  Nothing to eat or drink after midnight except a sip of water with medications (see medication instructions below)  MEDICATION INSTRUCTIONS: !!IF ANY NEW MEDICATIONS ARE STARTED AFTER TODAY, PLEASE NOTIFY YOUR PROVIDER AS SOON AS POSSIBLE!!   PLEASE CONTINUE TAKING YOUR ELIQUIS WITH NO SKIPPED DOSES  Continue taking your anticoagulant (blood thinner): Apixaban (Eliquis).  You will need to continue this after your procedure until you are told by your provider that it is safe to stop.    LABS:   Come to the lab at [facility] at [address] between the hours of 8:00 am and 4:30 pm. You do NOT have to be fasting.  COME FOR LAB ON 11/12/22   FYI:  For your safety, and to allow [place] to monitor your vital signs accurately during  the surgery/procedure we request: If you have artificial nails, gel coating, SNS etc, please have those removed prior to your surgery/procedure. Not having the nail coverings /polish removed may result in cancellation or delay of your surgery/procedure.  You must have a responsible person to drive you home and stay in the waiting area during your procedure. Failure to do so could result in cancellation.  Bring your insurance cards.  *Special Note: Every effort is made to have your procedure done on time. Occasionally there are emergencies that occur at the hospital that may cause delays. Please be patient if a delay does occur.       Follow-Up:  1.)  2 WEEKS FOR NURSE VISIT EKG--TO CONFIRM IF STILL IN AFIB OR NOT  2.)  6 WEEKS WITH AN EXTENDER IN THE OFFICE

## 2022-10-24 DIAGNOSIS — L218 Other seborrheic dermatitis: Secondary | ICD-10-CM | POA: Diagnosis not present

## 2022-10-24 DIAGNOSIS — Z85828 Personal history of other malignant neoplasm of skin: Secondary | ICD-10-CM | POA: Diagnosis not present

## 2022-10-24 DIAGNOSIS — L408 Other psoriasis: Secondary | ICD-10-CM | POA: Diagnosis not present

## 2022-10-24 DIAGNOSIS — L718 Other rosacea: Secondary | ICD-10-CM | POA: Diagnosis not present

## 2022-10-24 DIAGNOSIS — L814 Other melanin hyperpigmentation: Secondary | ICD-10-CM | POA: Diagnosis not present

## 2022-10-24 DIAGNOSIS — L821 Other seborrheic keratosis: Secondary | ICD-10-CM | POA: Diagnosis not present

## 2022-10-24 DIAGNOSIS — Z08 Encounter for follow-up examination after completed treatment for malignant neoplasm: Secondary | ICD-10-CM | POA: Diagnosis not present

## 2022-10-24 DIAGNOSIS — D1801 Hemangioma of skin and subcutaneous tissue: Secondary | ICD-10-CM | POA: Diagnosis not present

## 2022-11-02 ENCOUNTER — Other Ambulatory Visit: Payer: Self-pay

## 2022-11-02 MED ORDER — ROSUVASTATIN CALCIUM 20 MG PO TABS: 20.0000 mg | ORAL_TABLET | Freq: Every day | ORAL | 3 refills | Status: DC

## 2022-11-06 ENCOUNTER — Ambulatory Visit: Payer: Medicare HMO | Attending: Cardiology | Admitting: Cardiology

## 2022-11-06 VITALS — HR 107 | Ht 70.0 in | Wt 227.4 lb

## 2022-11-06 DIAGNOSIS — I480 Paroxysmal atrial fibrillation: Secondary | ICD-10-CM | POA: Diagnosis not present

## 2022-11-06 NOTE — Progress Notes (Signed)
    Nurse Visit   Date of Encounter: 11/06/2022 ID: Jermaine Soto, DOB 1945/11/28, MRN [id number]  PCP:  Myrlene Broker, MD   [facility] Providers Cardiologist:  Meriam Sprague, MD      Visit Details   VS:  Pulse (!) 107   Ht 5\' 10"  (1.778 m)   Wt 227 lb 6.4 oz (103.1 kg)   SpO2 98%   BMI 32.63 kg/m  , BMI Body mass index is 32.63 kg/m.  Wt Readings from Last 3 Encounters:  11/06/22 227 lb 6.4 oz (103.1 kg)  10/22/22 226 lb 9.6 oz (102.8 kg)  10/10/22 228 lb (103.4 kg)     Reason for visit: EKG to confirm Afib proir to Cardioversion  Performed today: Vitals, EKG, Provider consulted:Dr. Shari Prows, and Education Changes (medications, testing, etc.) : none Length of Visit: 20 minutes  EKG reviewed by Dr. Shari Prows. Confirmed Pt is still in Afib. Will keep cardioversion as scheduled on 6/4.  Medications Adjustments/Labs and Tests Ordered: No orders of the defined types were placed in this encounter.  No orders of the defined types were placed in this encounter.    Signed, Richelle Ito, RN  11/06/2022 11:23 AM

## 2022-11-10 DIAGNOSIS — R69 Illness, unspecified: Secondary | ICD-10-CM | POA: Diagnosis not present

## 2022-11-12 ENCOUNTER — Ambulatory Visit: Payer: Medicare HMO | Attending: Cardiology

## 2022-11-12 DIAGNOSIS — E230 Hypopituitarism: Secondary | ICD-10-CM | POA: Diagnosis not present

## 2022-11-12 DIAGNOSIS — I2510 Atherosclerotic heart disease of native coronary artery without angina pectoris: Secondary | ICD-10-CM

## 2022-11-12 DIAGNOSIS — E785 Hyperlipidemia, unspecified: Secondary | ICD-10-CM

## 2022-11-12 DIAGNOSIS — R634 Abnormal weight loss: Secondary | ICD-10-CM | POA: Diagnosis not present

## 2022-11-12 DIAGNOSIS — E871 Hypo-osmolality and hyponatremia: Secondary | ICD-10-CM

## 2022-11-12 DIAGNOSIS — I480 Paroxysmal atrial fibrillation: Secondary | ICD-10-CM | POA: Diagnosis not present

## 2022-11-12 DIAGNOSIS — D6869 Other thrombophilia: Secondary | ICD-10-CM | POA: Diagnosis not present

## 2022-11-12 DIAGNOSIS — I2584 Coronary atherosclerosis due to calcified coronary lesion: Secondary | ICD-10-CM | POA: Diagnosis not present

## 2022-11-12 LAB — CBC WITH DIFFERENTIAL/PLATELET

## 2022-11-12 LAB — BASIC METABOLIC PANEL
BUN/Creatinine Ratio: 17 (ref 10–24)
Calcium: 9.2 mg/dL (ref 8.6–10.2)
Creatinine, Ser: 1.2 mg/dL (ref 0.76–1.27)
Glucose: 147 mg/dL — ABNORMAL HIGH (ref 70–99)

## 2022-11-12 NOTE — Progress Notes (Signed)
Left a voicemail stating that pt needed to be at hospital at [facility] for procedure. Instructed pt to be NPO after 0000 and to have a ride home with a responsible adult to stay with them for 24 hours after the procedure.

## 2022-11-12 NOTE — Anesthesia Preprocedure Evaluation (Signed)
Anesthesia Evaluation  Patient identified by MRN, date of birth, ID band Patient awake    Reviewed: Allergy & Precautions, NPO status , Patient's Chart, lab work & pertinent test results  History of Anesthesia Complications Negative for: history of anesthetic complications  Airway Mallampati: II  TM Distance: >3 FB Neck ROM: Full    Dental  (+) Dental Advisory Given   Pulmonary neg pulmonary ROS   Pulmonary exam normal        Cardiovascular + dysrhythmias Atrial Fibrillation  Rhythm:Irregular Rate:Normal   EF 55-60% in 2022    Neuro/Psych negative neurological ROS  negative psych ROS   GI/Hepatic negative GI ROS, Neg liver ROS,,,  Endo/Other  Hypothyroidism   Panhypopituitarism   Renal/GU negative Renal ROS     Musculoskeletal  (+) Arthritis ,    Abdominal   Peds  Hematology negative hematology ROS (+)  On eliquis    Anesthesia Other Findings   Reproductive/Obstetrics                             Anesthesia Physical Anesthesia Plan  ASA: 3  Anesthesia Plan: General   Post-op Pain Management: Minimal or no pain anticipated   Induction: Intravenous  PONV Risk Score and Plan: 2 and Treatment may vary due to age or medical condition and Propofol infusion  Airway Management Planned: Natural Airway and Mask  Additional Equipment: None  Intra-op Plan:   Post-operative Plan:   Informed Consent: I have reviewed the patients History and Physical, chart, labs and discussed the procedure including the risks, benefits and alternatives for the proposed anesthesia with the patient or authorized representative who has indicated his/her understanding and acceptance.       Plan Discussed with: CRNA and Anesthesiologist  Anesthesia Plan Comments:         Anesthesia Quick Evaluation

## 2022-11-13 ENCOUNTER — Encounter (HOSPITAL_COMMUNITY): Payer: Self-pay | Admitting: Cardiology

## 2022-11-13 ENCOUNTER — Encounter (HOSPITAL_COMMUNITY)
Admission: RE | Disposition: A | Discharge status: Home / Self Care | Payer: Self-pay | Source: Home / Self Care | Attending: Cardiology

## 2022-11-13 ENCOUNTER — Ambulatory Visit (HOSPITAL_COMMUNITY): Payer: Medicare HMO | Admitting: Registered Nurse

## 2022-11-13 ENCOUNTER — Other Ambulatory Visit: Payer: Self-pay

## 2022-11-13 ENCOUNTER — Ambulatory Visit (HOSPITAL_BASED_OUTPATIENT_CLINIC_OR_DEPARTMENT_OTHER): Payer: Medicare HMO | Admitting: Registered Nurse

## 2022-11-13 ENCOUNTER — Ambulatory Visit (HOSPITAL_COMMUNITY)
Admission: RE | Admit: 2022-11-13 | Discharge: 2022-11-13 | Disposition: A | Discharge status: Home / Self Care | Payer: Medicare HMO | Attending: Cardiology | Admitting: Cardiology

## 2022-11-13 DIAGNOSIS — D6869 Other thrombophilia: Secondary | ICD-10-CM | POA: Insufficient documentation

## 2022-11-13 DIAGNOSIS — E230 Hypopituitarism: Secondary | ICD-10-CM

## 2022-11-13 DIAGNOSIS — I2510 Atherosclerotic heart disease of native coronary artery without angina pectoris: Secondary | ICD-10-CM | POA: Diagnosis not present

## 2022-11-13 DIAGNOSIS — I4819 Other persistent atrial fibrillation: Secondary | ICD-10-CM | POA: Diagnosis not present

## 2022-11-13 DIAGNOSIS — I4891 Unspecified atrial fibrillation: Secondary | ICD-10-CM

## 2022-11-13 DIAGNOSIS — M1990 Unspecified osteoarthritis, unspecified site: Secondary | ICD-10-CM | POA: Diagnosis not present

## 2022-11-13 DIAGNOSIS — E871 Hypo-osmolality and hyponatremia: Secondary | ICD-10-CM | POA: Insufficient documentation

## 2022-11-13 DIAGNOSIS — R634 Abnormal weight loss: Secondary | ICD-10-CM | POA: Insufficient documentation

## 2022-11-13 DIAGNOSIS — E785 Hyperlipidemia, unspecified: Secondary | ICD-10-CM | POA: Insufficient documentation

## 2022-11-13 DIAGNOSIS — E039 Hypothyroidism, unspecified: Secondary | ICD-10-CM

## 2022-11-13 DIAGNOSIS — I480 Paroxysmal atrial fibrillation: Secondary | ICD-10-CM | POA: Insufficient documentation

## 2022-11-13 DIAGNOSIS — E038 Other specified hypothyroidism: Secondary | ICD-10-CM | POA: Diagnosis not present

## 2022-11-13 HISTORY — PX: CARDIOVERSION: SHX1299

## 2022-11-13 LAB — CBC WITH DIFFERENTIAL/PLATELET
Basophils Absolute: 0.1 10*3/uL (ref 0.0–0.2)
Basos: 1 %
EOS (ABSOLUTE): 0.1 10*3/uL (ref 0.0–0.4)
Eos: 1 %
Hematocrit: 46.4 % (ref 37.5–51.0)
Hemoglobin: 15.3 g/dL (ref 13.0–17.7)
Immature Granulocytes: 0 %
Lymphocytes Absolute: 2.2 10*3/uL (ref 0.7–3.1)
Lymphs: 19 %
MCH: 31.1 pg (ref 26.6–33.0)
MCHC: 33 g/dL (ref 31.5–35.7)
MCV: 94 fL (ref 79–97)
Monocytes Absolute: 0.8 10*3/uL (ref 0.1–0.9)
Monocytes: 7 %
Neutrophils: 72 %
RBC: 4.92 x10E6/uL (ref 4.14–5.80)

## 2022-11-13 LAB — BASIC METABOLIC PANEL
BUN: 20 mg/dL (ref 8–27)
CO2: 22 mmol/L (ref 20–29)
Chloride: 105 mmol/L (ref 96–106)
Potassium: 4 mmol/L (ref 3.5–5.2)
Sodium: 140 mmol/L (ref 134–144)
eGFR: 63 mL/min/{1.73_m2} (ref >59)

## 2022-11-13 SURGERY — CARDIOVERSION
Anesthesia: General

## 2022-11-13 MED ORDER — LIDOCAINE 2% (20 MG/ML) 5 ML SYRINGE
INTRAMUSCULAR | Status: DC | PRN
  Administered 2022-11-13: 60 mg via INTRAVENOUS

## 2022-11-13 MED ORDER — SODIUM CHLORIDE 0.9 % IV SOLN: INTRAVENOUS | Status: DC

## 2022-11-13 MED ORDER — PROPOFOL 10 MG/ML IV BOLUS
INTRAVENOUS | Status: DC | PRN
  Administered 2022-11-13: 70 mg via INTRAVENOUS

## 2022-11-13 SURGICAL SUPPLY — 1 items: ELECT DEFIB PAD ADLT CADENCE (PAD) ×1 IMPLANT

## 2022-11-13 NOTE — Transfer of Care (Signed)
Immediate Anesthesia Transfer of Care Note  Patient: Jermaine Soto  Procedure(s) Performed: CARDIOVERSION  Patient Location: PACU and Cath Lab  Anesthesia Type:MAC  Level of Consciousness: drowsy  Airway & Oxygen Therapy: Patient Spontanous Breathing  Post-op Assessment: Report given to RN and Post -op Vital signs reviewed and stable  Post vital signs: Reviewed and stable  Last Vitals:  Vitals Value Taken Time  BP 113/81    Temp    Pulse    Resp    SpO2 94      Last Pain:  Vitals:   11/13/22 0645  TempSrc: Temporal  PainSc: 0-No pain         Complications: There were no known notable events for this encounter.

## 2022-11-13 NOTE — CV Procedure (Signed)
Procedure:   DCCV  Indication:  Symptomatic atrial fibrillation  Procedure Note:  The patient signed informed consent.  They have had had therapeutic anticoagulation with Eliquis greater than 3 weeks.  Anesthesia was administered by Dr. Mal Amabile and Sharyn Dross, CRNA.  Adequate airway was maintained throughout and vital followed per protocol.  They were cardioverted x 3 with 200J of biphasic synchronized energy.  They converted to NSR.  After each of the first 2 shocks, patient converted to sinus rhythm for ~10-20 seconds, then converted back to Afib.  After 3rd shock, converted to sinus with PACs, and maintained sinus rhythm  There were no apparent complications.  The patient had normal neuro status and respiratory status post procedure with vitals stable as recorded elsewhere.    Follow up:  They will continue on current medical therapy and follow up with cardiology as scheduled.  Epifanio Lesches, MD 11/13/2022 7:43 AM

## 2022-11-13 NOTE — Interval H&P Note (Signed)
History and Physical Interval Note:  11/13/2022 7:25 AM  Jermaine Soto  has presented today for surgery, with the diagnosis of AFIB.  The various methods of treatment have been discussed with the patient and family. After consideration of risks, benefits and other options for treatment, the patient has consented to  Procedure(s): CARDIOVERSION (N/A) as a surgical intervention.  The patient's history has been reviewed, patient examined, no change in status, stable for surgery.  I have reviewed the patient's chart and labs.  Questions were answered to the patient's satisfaction.     [name]

## 2022-11-13 NOTE — Anesthesia Postprocedure Evaluation (Signed)
Anesthesia Post Note  Patient: Jermaine Soto  Procedure(s) Performed: CARDIOVERSION     Patient location during evaluation: PACU Anesthesia Type: General Level of consciousness: awake and alert Pain management: pain level controlled Vital Signs Assessment: post-procedure vital signs reviewed and stable Respiratory status: spontaneous breathing, nonlabored ventilation and respiratory function stable Cardiovascular status: stable and blood pressure returned to baseline Anesthetic complications: no   There were no known notable events for this encounter.  Last Vitals:  Vitals:   11/13/22 0759 11/13/22 0801  BP:  123/85  Pulse: 71 65  Resp: 15 16  Temp: 36.7 C   SpO2: 94% 96%    Last Pain:  Vitals:   11/13/22 0759  TempSrc: Temporal  PainSc: 0-No pain                 [name]

## 2022-11-14 ENCOUNTER — Encounter (HOSPITAL_COMMUNITY): Payer: Self-pay | Admitting: Cardiology

## 2022-11-30 NOTE — Progress Notes (Unsigned)
Office Visit    Patient Name: Jermaine Soto Date of Encounter: 12/03/2022  Primary Care Provider:  Myrlene Broker, MD Primary Cardiologist:  Meriam Sprague, MD Primary Electrophysiologist: None   Past Medical History    Past Medical History:  Diagnosis Date   Arthritis    hands    Hypothyroidism    Past Surgical History:  Procedure Laterality Date   CARDIOVERSION N/A 11/13/2022   Procedure: CARDIOVERSION;  Surgeon: [name], MD;  Location: [facility] INVASIVE CV LAB;  Service: Cardiovascular;  Laterality: N/A;   IR ANGIO INTRA EXTRACRAN SEL COM CAROTID INNOMINATE BILAT MOD SED  06/15/2020   IR ANGIO VERTEBRAL SEL VERTEBRAL BILAT MOD SED  06/15/2020   RADIOLOGY WITH ANESTHESIA N/A 06/15/2020   Procedure: IR WITH ANESTHESIA;  Surgeon: Radiologist, Medication, MD;  Location: [facility] OR;  Service: Radiology;  Laterality: N/A;   ROBOT ASSISTED LAPAROSCOPIC RADICAL PROSTATECTOMY N/A 12/24/2012   Procedure: ROBOTIC ASSISTED LAPAROSCOPIC RADICAL PROSTATECTOMY;  Surgeon: Valetta Fuller, MD;  Location: [facility] ORS;  Service: Urology;  Laterality: N/A;    Allergies  Allergies  Allergen Reactions   Sulfa Antibiotics     Unknown   Tape Itching     History of Present Illness    Jermaine Soto  is a 77 year old male with a PMH of PAF (apixaban), hyponatremia, HLD, prostate CA s/p prostatectomy, 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  Jermaine Soto was seen initially by Dr. Shari Soto in 03/2020 for evaluation of atrial fibrillation.  He was found to have atrial fibrillation at PCP office and was started on Eliquis.  2D echo was completed showing EF of 50-55% with no RWMA, mildly dilated RA, mild MVR.  He was started on metoprolol for rate control And did well with no recurrence.  He  presented to the ED 06/15/2020 with complaint of left gaze and right-sided neglect with global aphasia.  MRI was completed and was negative and patient was found to have hyponatremia.  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  He had a 40 pound weight loss secondary to hyperthyroidism.  He was last seen by Dr. Shari Soto on 10/22/2022 for follow-up visit.  During visit patient was found to be back in AF but was asymptomatic.  He endorsed missing possibly 2 doses of his apixaban.  He was scheduled for DCCV on 11/13/2022 with conversion to sinus rhythm.  [name] presents today for post hospital follow-up.  Since last being seen in the office patient reports that he has been for going well but has noticed some fluctuation in his heart rate.  He denies any dizziness or shortness of breath with heart rate fluctuations and has been compliant with his medications.  His blood pressure today is controlled at 122/84 and heart rate currently is 93 bpm.  He is compliant with his current medications and denies any missed doses or adverse reactions.  During today's visit we discussed avoiding triggers for atrial fibrillation and also possible treatments moving forward if atrial fibrillation recurs..  Patient denies chest pain, palpitations, dyspnea, PND, orthopnea, nausea, vomiting, dizziness, syncope, edema, weight gain, or early satiety.   Home Medications    Current Outpatient Medications  Medication Sig Dispense Refill   apixaban (ELIQUIS) 5 MG TABS tablet Take 1 tablet (5 mg total) by mouth 2 (two) times daily. 180 tablet 3   cholecalciferol (VITAMIN D3) 25 MCG (1000 UNIT)  tablet Take 1,000 Units by mouth daily.     levothyroxine (SYNTHROID) 100 MCG tablet Take 1 tablet (100 mcg total) by mouth daily. 90 tablet 1   metoprolol tartrate (LOPRESSOR) 25 MG tablet Take 0.5 tablets (12.5 mg total) by mouth 2 (two) times daily. 90 tablet 3   Multiple Vitamins-Minerals (PRESERVISION AREDS 2 PO) Take 1 capsule by mouth in the morning and at bedtime.     predniSONE (DELTASONE) 5 MG tablet Take 1 tablet (5 mg total) by mouth daily with  breakfast. 90 tablet 3   rosuvastatin (CRESTOR) 20 MG tablet Take 1 tablet (20 mg total) by mouth daily. 90 tablet 3   Testosterone 30 MG/ACT SOLN DISPENSE LIQUID INTO APPLICATION CUP, APPLY IN THE ARMPITS, ONCE ON EACH SIDE 90 mL 2   No current facility-administered medications for this visit.     Review of Systems  Please see the history of present illness.    (+) Palpitations  All other systems reviewed and are otherwise negative except as noted above.  Physical Exam    Wt Readings from Last 3 Encounters:  12/03/22 229 lb (103.9 kg)  11/13/22 225 lb (102.1 kg)  11/06/22 227 lb 6.4 oz (103.1 kg)   VS: Vitals:   12/03/22 0813  BP: 122/84  Pulse: 93  SpO2: 98%  ,Body mass index is 32.86 kg/m.  Constitutional:      Appearance: Healthy appearance. Not in distress.  Neck:     Vascular: JVD normal.  Pulmonary:     Effort: Pulmonary effort is normal.     Breath sounds: No wheezing. No rales. Diminished in the bases Cardiovascular:     Irregularly irregular normal S1. Normal S2.      Murmurs: There is no murmur.  Edema:    Peripheral edema absent.  Abdominal:     Palpations: Abdomen is soft non tender. There is no hepatomegaly.  Skin:    General: Skin is warm and dry.  Neurological:     General: No focal deficit present.     Mental Status: Alert and oriented to person, place and time.     Cranial Nerves: Cranial nerves are intact.  EKG/LABS/ Recent Cardiac Studies    ECG personally reviewed by me today -atrial fibrillation with RVR and rate of 108 bpm with no acute changes consistent with previous EKG.  Cardiac Studies & Procedures       ECHOCARDIOGRAM  ECHOCARDIOGRAM COMPLETE 06/16/2020  Narrative ECHOCARDIOGRAM REPORT    Patient Name:   Jermaine Soto Date of Exam: 06/16/2020 Medical Rec #:  [id number]   Height:       70.0 in Accession #:    [id number]  Weight:       181.7 lb Date of Birth:  1946-05-30    BSA:          2.004 m Patient Age:    74 years    BP:            148/70 mmHg Patient Gender: M           HR:           75 bpm. Exam Location:  Inpatient  Procedure: 2D Echo, Cardiac Doppler and Color Doppler  Indications:    CVA  History:        Patient has prior history of Echocardiogram examinations, most recent 04/28/2020. Stroke, Arrythmias:Atrial Fibrillation; Signs/Symptoms:Altered Mental Status.  Sonographer:    Lavenia Atlas Referring Phys: [phone] Leda Gauze   Sonographer Comments: Suboptimal parasternal  window, suboptimal apical window and echo performed with patient supine and on artificial respirator. Image acquisition challenging due to uncooperative patient. IMPRESSIONS   1. Left ventricular ejection fraction, by estimation, is 55 to 60%. The left ventricle has normal function. The left ventricle has no regional wall motion abnormalities. There is mild concentric left ventricular hypertrophy. Left ventricular diastolic parameters are consistent with Grade I diastolic dysfunction (impaired relaxation). 2. Right ventricular systolic function is normal. The right ventricular size is normal. There is normal pulmonary artery systolic pressure. The estimated right ventricular systolic pressure is 27.2 mmHg. 3. The mitral valve is normal in structure. No evidence of mitral valve regurgitation. No evidence of mitral stenosis. Moderate mitral annular calcification. 4. The aortic valve is normal in structure. Aortic valve regurgitation is not visualized. Mild to moderate aortic valve sclerosis/calcification is present, without any evidence of aortic stenosis. 5. The inferior vena cava is normal in size with greater than 50% respiratory variability, suggesting right atrial pressure of 3 mmHg.  Conclusion(s)/Recommendation(s): No intracardiac source of embolism detected on this transthoracic study. A transesophageal echocardiogram is recommended to exclude cardiac source of embolism if clinically indicated.  FINDINGS Left  Ventricle: Left ventricular ejection fraction, by estimation, is 55 to 60%. The left ventricle has normal function. The left ventricle has no regional wall motion abnormalities. The left ventricular internal cavity size was normal in size. There is mild concentric left ventricular hypertrophy. Left ventricular diastolic parameters are consistent with Grade I diastolic dysfunction (impaired relaxation). Normal left ventricular filling pressure.  Right Ventricle: The right ventricular size is normal. No increase in right ventricular wall thickness. Right ventricular systolic function is normal. There is normal pulmonary artery systolic pressure. The tricuspid regurgitant velocity is 2.46 m/s, and with an assumed right atrial pressure of 3 mmHg, the estimated right ventricular systolic pressure is 27.2 mmHg.  Left Atrium: Left atrial size was normal in size.  Right Atrium: Right atrial size was normal in size.  Pericardium: There is no evidence of pericardial effusion.  Mitral Valve: The mitral valve is normal in structure. Moderate mitral annular calcification. No evidence of mitral valve regurgitation. No evidence of mitral valve stenosis.  Tricuspid Valve: The tricuspid valve is normal in structure. Tricuspid valve regurgitation is mild . No evidence of tricuspid stenosis.  Aortic Valve: The aortic valve is normal in structure. Aortic valve regurgitation is not visualized. Mild to moderate aortic valve sclerosis/calcification is present, without any evidence of aortic stenosis.  Pulmonic Valve: The pulmonic valve was normal in structure. Pulmonic valve regurgitation is not visualized. No evidence of pulmonic stenosis.  Aorta: The aortic root is normal in size and structure.  Venous: The inferior vena cava is normal in size with greater than 50% respiratory variability, suggesting right atrial pressure of 3 mmHg.  IAS/Shunts: No atrial level shunt detected by color flow Doppler.   LEFT  VENTRICLE PLAX 2D LVIDd:         4.20 cm  Diastology LVIDs:         3.00 cm  LV e' medial:    5.77 cm/s LV PW:         1.10 cm  LV E/e' medial:  10.2 LV IVS:        1.10 cm  LV e' lateral:   5.55 cm/s LVOT diam:     1.90 cm  LV E/e' lateral: 10.6 LV SV:         41 LV SV Index:   20 LVOT Area:  2.84 cm   RIGHT VENTRICLE RV Basal diam:  3.20 cm RV S prime:     13.30 cm/s TAPSE (M-mode): 2.4 cm  LEFT ATRIUM           Index       RIGHT ATRIUM           Index LA diam:      2.90 cm 1.45 cm/m  RA Area:     15.00 cm LA Vol (A4C): 31.9 ml 15.92 ml/m RA Volume:   33.40 ml  16.67 ml/m AORTIC VALVE LVOT Vmax:   63.30 cm/s LVOT Vmean:  43.200 cm/s LVOT VTI:    0.143 m  AORTA Ao Root diam: 3.20 cm  MITRAL VALVE               TRICUSPID VALVE MV Area (PHT): 5.27 cm    TR Peak grad:   24.2 mmHg MV Decel Time: 144 msec    TR Vmax:        246.00 cm/s MV E velocity: 58.70 cm/s MV A velocity: 64.30 cm/s  SHUNTS MV E/A ratio:  0.91        Systemic VTI:  0.14 m Systemic Diam: 1.90 cm  Tobias Alexander MD Electronically signed by Tobias Alexander MD Signature Date/Time: 06/16/2020/11:41:05 AM    Final             Risk Assessment/Calculations:    CHA2DS2-VASc Score = 3   This indicates a 3.2% annual risk of stroke. The patient's score is based upon: CHF History: 0 HTN History: 0 Diabetes History: 0 Stroke History: 0 Vascular Disease History: 1 Age Score: 2 Gender Score: 0           Lab Results  Component Value Date   WBC 11.5 (H) 11/12/2022   HGB 15.3 11/12/2022   HCT 46.4 11/12/2022   MCV 94 11/12/2022   PLT 221 11/12/2022   Lab Results  Component Value Date   CREATININE 1.20 11/12/2022   BUN 20 11/12/2022   NA 140 11/12/2022   K 4.0 11/12/2022   CL 105 11/12/2022   CO2 22 11/12/2022   Lab Results  Component Value Date   ALT 12 06/28/2020   AST 19 06/28/2020   ALKPHOS 38 (L) 06/28/2020   BILITOT 0.5 06/28/2020   Lab Results  Component Value Date    CHOL 132 07/09/2022   HDL 59.10 07/09/2022   LDLCALC 37 07/09/2022   TRIG 179.0 (H) 07/09/2022   CHOLHDL 2 07/09/2022    Lab Results  Component Value Date   HGBA1C 6.0 10/10/2022     Assessment & Plan    1.  Atrial fibrillation: -s/p recent DCCV and today is unfortunately still in atrial fibrillation with RVR.  He is however asymptomatic and tolerating heart rate well. -Patient's hemoglobin hemoglobin was 14.5 and creatinine was 1.2 -Continue Eliquis 5 mg twice daily -We will increase metoprolol to 25 mg twice daily with as needed 25 mg for elevated heart rate -Ambulatory referral to AF clinic management and treatment -CHA2DS2-VASc Score = 3 [CHF History: 0, HTN History: 0, Diabetes History: 0, Stroke History: 0, Vascular Disease History: 1, Age Score: 2, Gender Score: 0].  Therefore, the patient's annual risk of stroke is 3.2 %.      2.  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  4.  SIADH/Hyponatremia: -Patient's sodium was 140 -Continue fluid restrictions and  liberation with salt as directed     Disposition: Follow-up with Meriam Sprague, MD or APP in 2 months    Medication Adjustments/Labs and Tests Ordered: Current medicines are reviewed at length with the patient today.  Concerns regarding medicines are outlined above.   Signed, Napoleon Form, Leodis Rains, NP 12/03/2022, 9:00 AM [facility]

## 2022-12-03 ENCOUNTER — Encounter: Payer: Self-pay | Admitting: Nurse Practitioner

## 2022-12-03 ENCOUNTER — Ambulatory Visit: Payer: Medicare HMO | Attending: Nurse Practitioner | Admitting: Nurse Practitioner

## 2022-12-03 VITALS — BP 122/84 | HR 93 | Ht 70.0 in | Wt 229.0 lb

## 2022-12-03 DIAGNOSIS — E785 Hyperlipidemia, unspecified: Secondary | ICD-10-CM | POA: Diagnosis not present

## 2022-12-03 DIAGNOSIS — D6869 Other thrombophilia: Secondary | ICD-10-CM | POA: Diagnosis not present

## 2022-12-03 DIAGNOSIS — E230 Hypopituitarism: Secondary | ICD-10-CM

## 2022-12-03 DIAGNOSIS — I2584 Coronary atherosclerosis due to calcified coronary lesion: Secondary | ICD-10-CM | POA: Diagnosis not present

## 2022-12-03 DIAGNOSIS — R634 Abnormal weight loss: Secondary | ICD-10-CM | POA: Diagnosis not present

## 2022-12-03 DIAGNOSIS — I2510 Atherosclerotic heart disease of native coronary artery without angina pectoris: Secondary | ICD-10-CM | POA: Diagnosis not present

## 2022-12-03 DIAGNOSIS — E038 Other specified hypothyroidism: Secondary | ICD-10-CM | POA: Diagnosis not present

## 2022-12-03 DIAGNOSIS — I480 Paroxysmal atrial fibrillation: Secondary | ICD-10-CM | POA: Diagnosis not present

## 2022-12-03 DIAGNOSIS — E871 Hypo-osmolality and hyponatremia: Secondary | ICD-10-CM | POA: Diagnosis not present

## 2022-12-03 MED ORDER — METOPROLOL TARTRATE 25 MG PO TABS: 25.0000 mg | ORAL_TABLET | Freq: Two times a day (BID) | ORAL | 1 refills | Status: DC

## 2022-12-03 NOTE — Patient Instructions (Addendum)
Medication Instructions:  INCREASE Metoprolol to 25mg  Take 1 tablet twice a day and can take an additional tablet as needed if heart rate is over 100 *If you need a refill on your cardiac medications before your next appointment, please call your pharmacy*   Lab Work: None ordered   Testing/Procedures: None ordered   Follow-Up: At [facility], you and your health needs are our priority.  As part of our continuing mission to provide you with exceptional heart care, we have created designated Provider Care Teams.  These Care Teams include your primary Cardiologist (physician) and Advanced Practice Providers (APPs -  Physician Assistants and Nurse Practitioners) who all work together to provide you with the care you need, when you need it.  We recommend signing up for the patient portal called "MyChart".  Sign up information is provided on this After Visit Summary.  MyChart is used to connect with patients for Virtual Visits (Telemedicine).  Patients are able to view lab/test results, encounter notes, upcoming appointments, etc.  Non-urgent messages can be sent to your provider as well.   To learn more about what you can do with MyChart, go to [email or web address].    Your next appointment:   2 month(s)  Provider:   Mylinda Latina Provider accepting new patients or Robin Searing, NP     Other Instructions You have been referred to AFIB CLINIC

## 2022-12-10 DIAGNOSIS — R69 Illness, unspecified: Secondary | ICD-10-CM | POA: Diagnosis not present

## 2022-12-18 ENCOUNTER — Encounter: Payer: Self-pay | Admitting: "Endocrinology

## 2022-12-18 ENCOUNTER — Ambulatory Visit: Payer: Medicare HMO | Admitting: "Endocrinology

## 2022-12-18 VITALS — BP 140/80 | HR 93 | Ht 70.0 in | Wt 229.6 lb

## 2022-12-18 DIAGNOSIS — E042 Nontoxic multinodular goiter: Secondary | ICD-10-CM

## 2022-12-18 DIAGNOSIS — E230 Hypopituitarism: Secondary | ICD-10-CM

## 2022-12-18 DIAGNOSIS — E236 Other disorders of pituitary gland: Secondary | ICD-10-CM

## 2022-12-18 DIAGNOSIS — E038 Other specified hypothyroidism: Secondary | ICD-10-CM

## 2022-12-18 DIAGNOSIS — E2749 Other adrenocortical insufficiency: Secondary | ICD-10-CM | POA: Diagnosis not present

## 2022-12-18 LAB — BASIC METABOLIC PANEL
BUN: 22 mg/dL (ref 6–23)
CO2: 28 mEq/L (ref 19–32)
Calcium: 9.7 mg/dL (ref 8.4–10.5)
Chloride: 103 mEq/L (ref 96–112)
Creatinine, Ser: 1.22 mg/dL (ref 0.40–1.50)
GFR: 57.39 mL/min — ABNORMAL LOW (ref >60.00)
Glucose, Bld: 111 mg/dL — ABNORMAL HIGH (ref 70–99)
Potassium: 4.6 mEq/L (ref 3.5–5.1)
Sodium: 140 mEq/L (ref 135–145)

## 2022-12-18 LAB — T4, FREE: Free T4: 1.06 ng/dL (ref 0.60–1.60)

## 2022-12-18 MED ORDER — LEVOTHYROXINE SODIUM 100 MCG PO TABS: 100.0000 ug | ORAL_TABLET | Freq: Every day | ORAL | 1 refills | Status: DC

## 2022-12-18 NOTE — Progress Notes (Addendum)
Patient ID: Jermaine Soto, male   DOB: 07/24/1945, 77 y.o.   MRN: [id number]   Reason for Appointment: HYPOPITUITARISM, followup visit   History of Present Illness:   Seen by Dr Lucianne Muss in past   Hypothyroidism was first diagnosed in 2016.  The patient has been treated with synthroid since 2016, for hypothyroidism. Prior to endocrinology consultation he was being treated by PCP with low doses of levothyroxine between 50 and 75 mcg This was subsequently increased when the diagnosis of SECONDARY hypothyroidism was made during his hospitalization in 06/2020.  He is currently on stable dose of 100 mcg of levothyroxine.takes appropriately.  He has multiple thyroid nodules as well. Conclusion of ultrasound on 07/17/2021 showed:  Nodule 3 (TI-RADS 4), now measures 1.4 x 1.4 x 1.1 cm compared to 1.2 x 1.0 x 0.9 cm on the prior exam. It is located in the inferior right thyroid lobe and meets criteria for imaging follow-up. Annual ultrasound surveillance is recommended until 5 years of stability is documented.  Reports feeling pretty good. No dysphagia/dysphonia/dyspnea. Weigh is stable.   Wt Readings from Last 3 Encounters:  12/18/22 229 lb 9.6 oz (104.1 kg)  12/03/22 229 lb (103.9 kg)  11/13/22 225 lb (102.1 kg)    Lab Results  Component Value Date   TSH 0.04 (L) 08/06/2022   TSH 0.04 (L) 11/09/2021   TSH 0.03 (L) 07/10/2021   FREET4 1.08 08/06/2022   FREET4 1.13 11/09/2021   FREET4 0.97 07/10/2021   ADRENAL insufficiency:  In January 2022 he was admitted with symptoms of a stroke and sodium of 121. Although he was not showing any hypotension he had significantly low cortisol levels. Subsequently has been taking only 5 mg prednisone daily. He does not complain of any nausea, lightheadedness decreased appetite. Sodium has been consistently normal.  Wt Readings from Last 3 Encounters:  12/18/22 229 lb 9.6 oz (104.1 kg)  12/03/22 229 lb (103.9 kg)  11/13/22 225 lb (102.1 kg)     TESTOSTERONE deficiency: Although he has had 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    He was recommended testosterone supplementations because of his very low testosterone level as of June 2023. He started using Axiron in June 2023 and prefers taking 1 pump under each arm. Feels good on testosterone supplements,been able to exercise more regularly and he thinks he is building up more muscle in his upper body. Not sexually active but has good libido.  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 This was measuring 1.4 cm with some suprasellar extension. However, follow-up on 08/31/2021 showed the cyst to be about 3 to 4 mm only (full current dimension 4 mm*7*44mm).   Allergies as of 12/18/2022       Reactions   Sulfa Antibiotics    Unknown   Tape Itching        Medication List        Accurate as of December 18, 2022  9:38 AM. If you have any questions, ask your nurse or doctor.          apixaban 5 MG Tabs tablet Commonly known as: Eliquis Take 1 tablet (5 mg total) by mouth 2 (two) times daily.   cholecalciferol 25 MCG (1000 UNIT) tablet Commonly known as: VITAMIN D3 Take 1,000 Units by mouth daily.  levothyroxine 100 MCG tablet Commonly known as: SYNTHROID Take 1 tablet (100 mcg total) by mouth daily.   metoprolol tartrate 25 MG tablet Commonly known as: LOPRESSOR Take 1 tablet (25 mg total) by mouth 2 (two) times daily. Can take an additional tablet as needed if heart rate is over 100   predniSONE 5 MG tablet Commonly known as: DELTASONE Take 1 tablet (5 mg total) by mouth daily with breakfast.   PRESERVISION AREDS 2 PO Take 1 capsule by mouth in the morning and at bedtime.   rosuvastatin 20 MG tablet Commonly known as: CRESTOR Take 1 tablet (20 mg total) by  mouth daily.   Testosterone 30 MG/ACT Soln DISPENSE LIQUID INTO APPLICATION CUP, APPLY IN THE ARMPITS, ONCE ON EACH SIDE        Allergies:  Allergies  Allergen Reactions   Sulfa Antibiotics     Unknown   Tape Itching    Past Medical History:  Diagnosis Date   Arthritis    hands    Hypothyroidism     Past Surgical History:  Procedure Laterality Date   CARDIOVERSION N/A 11/13/2022   Procedure: CARDIOVERSION;  Surgeon: [name], MD;  Location: [facility] INVASIVE CV LAB;  Service: Cardiovascular;  Laterality: N/A;   IR ANGIO INTRA EXTRACRAN SEL COM CAROTID INNOMINATE BILAT MOD SED  06/15/2020   IR ANGIO VERTEBRAL SEL VERTEBRAL BILAT MOD SED  06/15/2020   RADIOLOGY WITH ANESTHESIA N/A 06/15/2020   Procedure: IR WITH ANESTHESIA;  Surgeon: Radiologist, Medication, MD;  Location: [facility] OR;  Service: Radiology;  Laterality: N/A;   ROBOT ASSISTED LAPAROSCOPIC RADICAL PROSTATECTOMY N/A 12/24/2012   Procedure: ROBOTIC ASSISTED LAPAROSCOPIC RADICAL PROSTATECTOMY;  Surgeon: Valetta Fuller, MD;  Location: [facility] ORS;  Service: Urology;  Laterality: N/A;    Family History  Problem Relation Age of Onset   Thyroid disease Neg Hx     Social History:  reports that he has never smoked. He has never used smokeless tobacco. He reports current alcohol use. He reports that he does not use drugs.  Review of Systems Same as above   Examination:   BP (!) 140/80   Pulse 93   Ht 5\' 10"  (1.778 m)   Wt 229 lb 9.6 oz (104.1 kg)   SpO2 98%   BMI 32.94 kg/m   Physical Exam Constitutional:      Appearance: Normal appearance. Not ill-appearing.  HENT:     Head: Normocephalic and atraumatic.  Eyes:     General: No scleral icterus.    Conjunctiva/sclera: Conjunctivae normal.  Pulmonary:     Effort: Pulmonary effort is normal. No respiratory distress.  Musculoskeletal:     Cervical back: Normal range of motion. No rigidity.  Skin:    Coloration: Skin is not jaundiced.     Findings: No rash.   Neurological:     Mental Status: Alert and oriented to person, place, and time. Psychiatric:        Behavior: Behavior normal.        Judgment: Judgment normal.    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  Diagnoses and all orders for this visit:  Panhypopituitarism (HCC) -     levothyroxine (SYNTHROID) 100 MCG tablet; Take 1 tablet (100 mcg total) by mouth daily. -     Testosterone,Free and Total; Future -     T4, free; Future -     Basic metabolic panel; Future -     Basic metabolic panel -     Testosterone,Free  and Total -     T4, free -     T4, free; Future -     Basic metabolic panel; Future -     Testosterone,Free and Total; Future  Hypogonadotropic hypogonadism (HCC)  Secondary adrenal insufficiency (HCC)  Central hypothyroidism  Pituitary cyst (HCC)  Multiple thyroid nodules -     US THYROID; Future   Hypopituitarism diagnosed in 2022. 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 Free T4 is in a good range and stable with 100 mcg supplementation  ADRENAL insufficiency, partial: Subjectively the doing well with only 5 mg of prednisone in the morning without any weakness, nausea or low sodium.  HYPOGONADISM: He has history of significant hypogonadism. Although his testosterone level is low normal he is not apply the medication the day he came in and also may not be using the proper technique to apply the Axiron solution.  Treatment:  No change in medication regimen. Discussed application technique for Testosterone. On Testosterone 30 MG/ACT SOLN. Stress doses of up to 10 mg prednisone if he has any acute illness/surgery for the duration of illness. Continue taking levothyroxine before breakfast unchanged. Labs today (has not applied testosterone today  so it will be a trough reading). Next time asked to do lab after 3-4 hours after testosterone application for peak reading.  Follow up US thyroid ordered per TR4 nodule follow up recommendation: 1, 2, 3 and 5 years  Patient instruction: You cannot make your own cortisol (stress hormone).  It is very important that you prednisone every day at the times we describe.  It can be very dangerous if you miss a dose. If you get sick with nausea and vomiting and cannot keep the medications down, you should come to the ER for IV steroids.  If you get sick with a cold or flu, double your dose for three days, then drop back down to your regular dose. Consider updating diagnosis of "adrenal insufficiency" in your phone emergency history and wear medical bracelet stating the same.   Return in about 4 months (around 04/20/2023) for visit, labs today, labs 1 week before next visit.   Caileigh Canche 12/18/2022, 9:38 AM   Note: This office note was prepared with Insurance underwriter. Any transcriptional errors that result from this process are unintentional.

## 2022-12-18 NOTE — Patient Instructions (Addendum)
  You cannot make your own cortisol (stress hormone).  It is very important that you prednisone every day at the times we describe.  It can be very dangerous if you miss a dose. If you get sick with nausea and vomiting and cannot keep the medications down, you should come to the ER for IV steroids.  If you get sick with a cold or flu, double your dose for three days, then drop back down to your regular dose.   Consider updating diagnosis of "adrenal insufficiency" in your phone emergency history and wear medical bracelet stating the same.

## 2022-12-18 NOTE — Addendum Note (Signed)
Addended by: Altamese Kreamer on: 12/18/2022 09:38 AM   Modules accepted: Orders

## 2022-12-19 LAB — TESTOSTERONE,FREE AND TOTAL

## 2022-12-20 LAB — TESTOSTERONE,FREE AND TOTAL: Testosterone: 497 ng/dL (ref 264–916)

## 2023-01-02 ENCOUNTER — Ambulatory Visit (HOSPITAL_BASED_OUTPATIENT_CLINIC_OR_DEPARTMENT_OTHER)
Admission: RE | Admit: 2023-01-02 | Discharge: 2023-01-02 | Disposition: A | Discharge status: Home / Self Care | Payer: Medicare HMO | Source: Ambulatory Visit | Attending: "Endocrinology | Admitting: "Endocrinology

## 2023-01-02 DIAGNOSIS — E042 Nontoxic multinodular goiter: Secondary | ICD-10-CM | POA: Insufficient documentation

## 2023-01-02 DIAGNOSIS — E041 Nontoxic single thyroid nodule: Secondary | ICD-10-CM | POA: Diagnosis not present

## 2023-01-14 ENCOUNTER — Ambulatory Visit (HOSPITAL_COMMUNITY)
Admission: RE | Admit: 2023-01-14 | Discharge: 2023-01-14 | Disposition: A | Discharge status: Home / Self Care | Payer: Medicare HMO | Source: Ambulatory Visit | Attending: Internal Medicine | Admitting: Internal Medicine

## 2023-01-14 ENCOUNTER — Encounter (HOSPITAL_COMMUNITY): Payer: Self-pay | Admitting: Internal Medicine

## 2023-01-14 VITALS — BP 124/84 | HR 89 | Ht 70.0 in | Wt 229.2 lb

## 2023-01-14 DIAGNOSIS — I4819 Other persistent atrial fibrillation: Secondary | ICD-10-CM | POA: Insufficient documentation

## 2023-01-14 DIAGNOSIS — I480 Paroxysmal atrial fibrillation: Secondary | ICD-10-CM | POA: Diagnosis not present

## 2023-01-14 DIAGNOSIS — I2510 Atherosclerotic heart disease of native coronary artery without angina pectoris: Secondary | ICD-10-CM | POA: Diagnosis not present

## 2023-01-14 DIAGNOSIS — Z7901 Long term (current) use of anticoagulants: Secondary | ICD-10-CM | POA: Diagnosis not present

## 2023-01-14 DIAGNOSIS — E871 Hypo-osmolality and hyponatremia: Secondary | ICD-10-CM | POA: Diagnosis not present

## 2023-01-14 DIAGNOSIS — E230 Hypopituitarism: Secondary | ICD-10-CM | POA: Diagnosis not present

## 2023-01-14 DIAGNOSIS — E785 Hyperlipidemia, unspecified: Secondary | ICD-10-CM | POA: Insufficient documentation

## 2023-01-14 DIAGNOSIS — E038 Other specified hypothyroidism: Secondary | ICD-10-CM | POA: Insufficient documentation

## 2023-01-14 DIAGNOSIS — R634 Abnormal weight loss: Secondary | ICD-10-CM | POA: Diagnosis not present

## 2023-01-14 DIAGNOSIS — D6869 Other thrombophilia: Secondary | ICD-10-CM | POA: Insufficient documentation

## 2023-01-14 DIAGNOSIS — Z6832 Body mass index (BMI) 32.0-32.9, adult: Secondary | ICD-10-CM | POA: Diagnosis not present

## 2023-01-14 DIAGNOSIS — I2584 Coronary atherosclerosis due to calcified coronary lesion: Secondary | ICD-10-CM | POA: Insufficient documentation

## 2023-01-14 DIAGNOSIS — R9431 Abnormal electrocardiogram [ECG] [EKG]: Secondary | ICD-10-CM | POA: Insufficient documentation

## 2023-01-14 NOTE — Progress Notes (Signed)
Primary Care Physician: Myrlene Broker, MD Primary Cardiologist: Meriam Sprague, MD Electrophysiologist: None     Referring Physician: Robin Searing, NP     Jermaine Soto is a 77 y.o. 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 Recently seen by Dr. Shari Prows in May found to be in Afib, underwent successful DCCV on 11/13/22 x 3 shocks, and found to be back in Afib in return cardiology visit on 6/24. Patient is on Eliquis for a CHADS2VASC score of 3.  On evaluation today, he is currently in Afib. He does not appear to have cardiac awareness of his Afib. He has not missed any doses of Eliquis. They just purchased [employer] device for monitoring rhythm at home.   Today, he denies symptoms of palpitations, chest pain, shortness of breath, orthopnea, PND, lower extremity edema, dizziness, presyncope, syncope, snoring, daytime somnolence, bleeding, or neurologic sequela. The patient is tolerating medications without difficulties and is otherwise without complaint today.    he has a BMI of Body mass index is 32.89 kg/m.Marland Kitchen Filed Weights   01/14/23 0838  Weight: 104 kg    Current Outpatient Medications  Medication Sig Dispense Refill   apixaban (ELIQUIS) 5 MG TABS tablet Take 1 tablet (5 mg total) by mouth 2 (two) times daily. 180 tablet 3   cholecalciferol (VITAMIN D3) 25 MCG (1000 UNIT) tablet Take 1,000 Units by mouth daily.     levothyroxine (SYNTHROID) 100 MCG tablet Take 1 tablet (100 mcg total) by mouth daily. 90 tablet 1   metoprolol tartrate (LOPRESSOR) 25 MG tablet Take 1 tablet (25 mg total) by mouth 2 (two) times daily. Can take an additional tablet as needed if heart rate is over 100 100 tablet 1   Multiple Vitamins-Minerals (PRESERVISION AREDS 2  PO) Take 1 capsule by mouth in the morning and at bedtime.     predniSONE (DELTASONE) 5 MG tablet Take 1 tablet (5 mg total) by mouth daily with breakfast. 90 tablet 3   rosuvastatin (CRESTOR) 20 MG tablet Take 1 tablet (20 mg total) by mouth daily. 90 tablet 3   Testosterone 30 MG/ACT SOLN DISPENSE LIQUID INTO APPLICATION CUP, APPLY IN THE ARMPITS, ONCE ON EACH SIDE 90 mL 2   No current facility-administered medications for this encounter.    Atrial Fibrillation Management history:  Previous antiarrhythmic drugs: None Previous cardioversions: 11/13/22 Previous ablations: None Anticoagulation history: Eliquis   ROS- All systems are reviewed and negative except as per the HPI above.  Physical Exam: BP 124/84   Pulse 89   Ht 5\' 10"  (1.778 m)   Wt 104 kg   BMI 32.89 kg/m   GEN: Well nourished, well developed in no acute distress NECK: No JVD; No carotid bruits CARDIAC: Irregularly irregular rate and rhythm, no murmurs, rubs, gallops RESPIRATORY:  Clear to auscultation without rales, wheezing or rhonchi  ABDOMEN: Soft, non-tender, non-distended EXTREMITIES:  No edema; No deformity   EKG today demonstrates  Vent. rate 89 BPM PR interval * ms QRS duration 78 ms QT/QTcB 348/423 ms P-R-T axes * 77 150 Atrial fibrillation with premature ventricular or aberrantly conducted complexes Low voltage QRS Cannot rule out Anterior infarct , age undetermined Abnormal ECG When compared with ECG of 03-Dec-2022 08:58, PREVIOUS ECG IS PRESENT  Echo 06/16/20 demonstrated   1. Left ventricular ejection fraction, by estimation,  is 55 to 60%. The  left ventricle has normal function. The left ventricle has no regional  wall motion abnormalities. There is mild concentric left ventricular  hypertrophy. Left ventricular diastolic  parameters are consistent with Grade I diastolic dysfunction (impaired  relaxation).   2. Right ventricular systolic function is normal. The right ventricular  size is  normal. There is normal pulmonary artery systolic pressure. The  estimated right ventricular systolic pressure is 27.2 mmHg.   3. The mitral valve is normal in structure. No evidence of mitral valve  regurgitation. No evidence of mitral stenosis. Moderate mitral annular  calcification.   4. The aortic valve is normal in structure. Aortic valve regurgitation is  not visualized. Mild to moderate aortic valve sclerosis/calcification is  present, without any evidence of aortic stenosis.   5. The inferior vena cava is normal in size with greater than 50%  respiratory variability, suggesting right atrial pressure of 3 mmHg.   ASSESSMENT & PLAN CHA2DS2-VASc Score = 3  The patient's score is based upon: CHF History: 0 HTN History: 0 Diabetes History: 0 Stroke History: 0 Vascular Disease History: 1 Age Score: 2 Gender Score: 0       ASSESSMENT AND PLAN: Persistent Atrial Fibrillation (ICD10:  I48.0) The patient's CHA2DS2-VASc score is 3, indicating a 3.2% annual risk of stroke.    He is currently in Afib.   We discussed rate and rhythm control strategies today. He does not appear to be a good candidate for flecainide due to noted coronary artery atherosclerosis on imaging. We discussed medications such as Multaq, Tikosyn, or amiodarone as a bridge to ablation. They will contact insurance for pricing and will make a decision on which medication to begin. They would like to discuss ablation with EP so will refer.    Secondary Hypercoagulable State (ICD10:  D68.69) The patient is at significant risk for stroke/thromboembolism based upon his CHA2DS2-VASc Score of 3.  Continue Apixaban (Eliquis).  No missed doses.    Patient will call later today with decision on medication therapy.   Lake Bells, PA-C  Afib Clinic [facility] [address] [place], [place] [address] [phone]

## 2023-01-15 ENCOUNTER — Telehealth (HOSPITAL_COMMUNITY): Payer: Self-pay | Admitting: *Deleted

## 2023-01-15 NOTE — Telephone Encounter (Signed)
Patient called back with interest of starting multaq - once cardioversion was mentioned as requirement along with multaq start pt decided he was unsure he wanted to proceed with anything. Educated pt on starting of AAD and most likely need of cardioversion with any of the drugs. He states he is not interested in ablation - encouraged pt to consult with EP to decide next steps of afib management. Pt states he would like to think about his options will call back.

## 2023-02-05 NOTE — Progress Notes (Unsigned)
Office Visit    Patient Name: Jermaine Soto Date of Encounter: 02/05/2023  Primary Care Provider:  Myrlene Broker, MD Primary Cardiologist:  Meriam Sprague, MD Primary Electrophysiologist: None   Past Medical History    Past Medical History:  Diagnosis Date   Arthritis    hands    Hypothyroidism    Past Surgical History:  Procedure Laterality Date   CARDIOVERSION N/A 11/13/2022   Procedure: CARDIOVERSION;  Surgeon: [name], MD;  Location: [facility] INVASIVE CV LAB;  Service: Cardiovascular;  Laterality: N/A;   IR ANGIO INTRA EXTRACRAN SEL COM CAROTID INNOMINATE BILAT MOD SED  06/15/2020   IR ANGIO VERTEBRAL SEL VERTEBRAL BILAT MOD SED  06/15/2020   RADIOLOGY WITH ANESTHESIA N/A 06/15/2020   Procedure: IR WITH ANESTHESIA;  Surgeon: Radiologist, Medication, MD;  Location: [facility] OR;  Service: Radiology;  Laterality: N/A;   ROBOT ASSISTED LAPAROSCOPIC RADICAL PROSTATECTOMY N/A 12/24/2012   Procedure: ROBOTIC ASSISTED LAPAROSCOPIC RADICAL PROSTATECTOMY;  Surgeon: Valetta Fuller, MD;  Location: [facility] ORS;  Service: Urology;  Laterality: N/A;    Allergies  Allergies  Allergen Reactions   Sulfa Antibiotics     Unknown   Tape Itching     History of Present Illness    [name]  is a 77 year old male with a PMH of PAF (apixaban), hyponatremia, HLD, prostate CA s/p prostatectomy, 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   Mr. Brasington was seen initially by Dr. Shari Prows in 03/2020 for evaluation of atrial fibrillation.  He was found to have atrial fibrillation at PCP office and was started on Eliquis.  2D echo was completed showing EF of 50-55% with no RWMA, mildly dilated RA, mild MVR.  He was started on metoprolol for rate control And did well with no recurrence. He presented to the ED 06/15/2020 with complaint of left gaze and right-sided neglect with global aphasia. MRI was completed and was negative and patient was found to have hyponatremia. He underwent urine study  which was consistent with SIADH and was discharged in stable condition after 2 L fluid restrictions. He was last seen by Dr. Shari Prows on 10/22/2022 for follow-up visit. During visit patient was found to be back in AF but was asymptomatic. He endorsed missing possibly 2 doses of his apixaban. He was scheduled for DCCV on 11/13/2022 with conversion to sinus rhythm.  He was seen in follow-up on 12/03/2022 reported doing well with some palpitations and fluctuations in heart rate.  During visit patient was in AF with RVR but was asymptomatic.  Metoprolol was increased to 25 mg twice daily.  He was seen in the AF clinic on 01/14/2023 for follow-up.  He reported not missing any doses of Eliquis discussion was made for regarding rhythm control patient also would like to discuss ablation with EP.  He was referred following his visit.  Since last being seen in the office patient reports***.  Patient denies chest pain, palpitations, dyspnea, PND, orthopnea, nausea, vomiting, dizziness, syncope, edema, weight gain, or early satiety.     ***Notes:  Home Medications    Current Outpatient Medications  Medication Sig Dispense Refill   apixaban (ELIQUIS) 5 MG TABS tablet Take 1 tablet (5 mg total) by mouth 2 (two) times daily. 180 tablet 3   cholecalciferol (VITAMIN D3) 25 MCG (1000 UNIT) tablet Take 1,000 Units by mouth daily.     levothyroxine (SYNTHROID) 100 MCG tablet Take 1 tablet (100 mcg total) by mouth daily. 90 tablet 1  metoprolol tartrate (LOPRESSOR) 25 MG tablet Take 1 tablet (25 mg total) by mouth 2 (two) times daily. Can take an additional tablet as needed if heart rate is over 100 100 tablet 1   Multiple Vitamins-Minerals (PRESERVISION AREDS 2 PO) Take 1 capsule by mouth in the morning and at bedtime.     predniSONE (DELTASONE) 5 MG tablet Take 1 tablet (5 mg total) by mouth daily with breakfast. 90 tablet 3   rosuvastatin (CRESTOR) 20 MG tablet Take 1 tablet (20 mg total) by mouth daily. 90 tablet 3    Testosterone 30 MG/ACT SOLN DISPENSE LIQUID INTO APPLICATION CUP, APPLY IN THE ARMPITS, ONCE ON EACH SIDE 90 mL 2   No current facility-administered medications for this visit.     Review of Systems  Please see the history of present illness.    (+)*** (+)***  All other systems reviewed and are otherwise negative except as noted above.  Physical Exam    Wt Readings from Last 3 Encounters:  01/14/23 229 lb 3.2 oz (104 kg)  12/18/22 229 lb 9.6 oz (104.1 kg)  12/03/22 229 lb (103.9 kg)   DG:LOVFI were no vitals filed for this visit.,There is no height or weight on file to calculate BMI.  Constitutional:      Appearance: Healthy appearance. Not in distress.  Neck:     Vascular: JVD normal.  Pulmonary:     Effort: Pulmonary effort is normal.     Breath sounds: No wheezing. No rales. Diminished in the bases Cardiovascular:     Normal rate. Regular rhythm. Normal S1. Normal S2.      Murmurs: There is no murmur.  Edema:    Peripheral edema absent.  Abdominal:     Palpations: Abdomen is soft non tender. There is no hepatomegaly.  Skin:    General: Skin is warm and dry.  Neurological:     General: No focal deficit present.     Mental Status: Alert and oriented to person, place and time.     Cranial Nerves: Cranial nerves are intact.  EKG/LABS/ Recent Cardiac Studies    ECG personally reviewed by me today - ***   Risk Assessment/Calculations:   {Does this patient have ATRIAL FIBRILLATION?[phone]}        Lab Results  Component Value Date   WBC 11.5 (H) 11/12/2022   HGB 15.3 11/12/2022   HCT 46.4 11/12/2022   MCV 94 11/12/2022   PLT 221 11/12/2022   Lab Results  Component Value Date   CREATININE 1.22 12/18/2022   BUN 22 12/18/2022   NA 140 12/18/2022   K 4.6 12/18/2022   CL 103 12/18/2022   CO2 28 12/18/2022   Lab Results  Component Value Date   ALT 12 06/28/2020   AST 19 06/28/2020   ALKPHOS 38 (L) 06/28/2020   BILITOT 0.5 06/28/2020   Lab Results   Component Value Date   CHOL 132 07/09/2022   HDL 59.10 07/09/2022   LDLCALC 37 07/09/2022   TRIG 179.0 (H) 07/09/2022   CHOLHDL 2 07/09/2022    Lab Results  Component Value Date   HGBA1C 6.0 10/10/2022     Assessment & Plan    1. Atrial fibrillation: -s/p recent DCCV   2.  Aortic atherosclerosis  3.Hyperlipidemia: -Patient's last/LDL was 37 -Continue Crestor 20 mg daily  4.Hypopituitarism: -Patient currently followed by endocrinology -Continue treatment plan as advised.  5. SIADH/Hyponatremia: -Patient's sodium was 140 -Continue fluid restrictions and liberation with salt as directed  Disposition: Follow-up with Meriam Sprague, MD or APP in *** months {Are you ordering a CV Procedure (e.g. stress test, cath, DCCV, TEE, etc)?   Press F2        :[id number]}   Medication Adjustments/Labs and Tests Ordered: Current medicines are reviewed at length with the patient today.  Concerns regarding medicines are outlined above.   Signed, Napoleon Form, Leodis Rains, NP 02/05/2023, 1:05 PM [facility]

## 2023-02-07 ENCOUNTER — Encounter: Payer: Self-pay | Admitting: Nurse Practitioner

## 2023-02-07 ENCOUNTER — Ambulatory Visit: Payer: Medicare HMO | Attending: Nurse Practitioner | Admitting: Nurse Practitioner

## 2023-02-07 VITALS — BP 132/74 | HR 81 | Ht 70.0 in | Wt 230.4 lb

## 2023-02-07 DIAGNOSIS — E222 Syndrome of inappropriate secretion of antidiuretic hormone: Secondary | ICD-10-CM | POA: Diagnosis not present

## 2023-02-07 DIAGNOSIS — I700 Atherosclerosis of aorta: Secondary | ICD-10-CM | POA: Diagnosis not present

## 2023-02-07 DIAGNOSIS — E236 Other disorders of pituitary gland: Secondary | ICD-10-CM

## 2023-02-07 DIAGNOSIS — I480 Paroxysmal atrial fibrillation: Secondary | ICD-10-CM

## 2023-02-07 DIAGNOSIS — E2749 Other adrenocortical insufficiency: Secondary | ICD-10-CM

## 2023-02-07 MED ORDER — METOPROLOL TARTRATE 50 MG PO TABS: 50.0000 mg | ORAL_TABLET | Freq: Two times a day (BID) | ORAL | 5 refills | Status: DC

## 2023-02-07 NOTE — Patient Instructions (Addendum)
Medication Instructions:  INCREASE Metoprolol to 50mg  Take 1 tablet twice a day  *If you need a refill on your cardiac medications before your next appointment, please call your pharmacy*   Lab Work: None ordered   Testing/Procedures: None Ordered   Follow-Up: At [facility], you and your health needs are our priority.  As part of our continuing mission to provide you with exceptional heart care, we have created designated Provider Care Teams.  These Care Teams include your primary Cardiologist (physician) and Advanced Practice Providers (APPs -  Physician Assistants and Nurse Practitioners) who all work together to provide you with the care you need, when you need it.  We recommend signing up for the patient portal called "MyChart".  Sign up information is provided on this After Visit Summary.  MyChart is used to connect with patients for Virtual Visits (Telemedicine).  Patients are able to view lab/test results, encounter notes, upcoming appointments, etc.  Non-urgent messages can be sent to your provider as well.   To learn more about what you can do with MyChart, go to [email or web address].    Your next appointment:   6 month(s)  Provider:   ANY PROVIDER ACCEPTING NEW PATIENTS      Other Instructions

## 2023-03-22 ENCOUNTER — Other Ambulatory Visit: Payer: Self-pay

## 2023-03-22 DIAGNOSIS — E230 Hypopituitarism: Secondary | ICD-10-CM

## 2023-03-22 MED ORDER — TESTOSTERONE 30 MG/ACT TD SOLN
TRANSDERMAL | 2 refills | Status: DC
Start: 2023-03-22 — End: 2024-02-06

## 2023-04-09 DIAGNOSIS — H35372 Puckering of macula, left eye: Secondary | ICD-10-CM | POA: Diagnosis not present

## 2023-04-09 DIAGNOSIS — H353132 Nonexudative age-related macular degeneration, bilateral, intermediate dry stage: Secondary | ICD-10-CM | POA: Diagnosis not present

## 2023-04-15 ENCOUNTER — Encounter: Payer: Self-pay | Admitting: Internal Medicine

## 2023-04-15 ENCOUNTER — Other Ambulatory Visit (INDEPENDENT_AMBULATORY_CARE_PROVIDER_SITE_OTHER): Payer: Medicare HMO

## 2023-04-15 ENCOUNTER — Ambulatory Visit (INDEPENDENT_AMBULATORY_CARE_PROVIDER_SITE_OTHER): Payer: Medicare HMO | Admitting: Internal Medicine

## 2023-04-15 VITALS — BP 118/80 | HR 106 | Temp 98.5°F | Ht 70.0 in | Wt 228.0 lb

## 2023-04-15 DIAGNOSIS — E230 Hypopituitarism: Secondary | ICD-10-CM

## 2023-04-15 DIAGNOSIS — E119 Type 2 diabetes mellitus without complications: Secondary | ICD-10-CM | POA: Diagnosis not present

## 2023-04-15 LAB — POCT GLYCOSYLATED HEMOGLOBIN (HGB A1C): HbA1c POC (<> result, manual entry): 6.5 % (ref 4.0–5.6)

## 2023-04-15 LAB — BASIC METABOLIC PANEL
BUN: 21 mg/dL (ref 6–23)
CO2: 27 meq/L (ref 19–32)
Calcium: 9.7 mg/dL (ref 8.4–10.5)
Chloride: 104 meq/L (ref 96–112)
Creatinine, Ser: 1.27 mg/dL (ref 0.40–1.50)
GFR: 54.56 mL/min — ABNORMAL LOW (ref >60.00)
Glucose, Bld: 138 mg/dL — ABNORMAL HIGH (ref 70–99)
Potassium: 4.6 meq/L (ref 3.5–5.1)
Sodium: 141 meq/L (ref 135–145)

## 2023-04-15 LAB — T4, FREE: Free T4: 1.15 ng/dL (ref 0.60–1.60)

## 2023-04-15 NOTE — Patient Instructions (Addendum)
Your sugars are now in the diabetes range. We will recheck in 3-6 months.

## 2023-04-15 NOTE — Assessment & Plan Note (Signed)
POC HgA1c done today and 6.5 with earlier this year 6.8 would make new diagnosis of diabetes. He has had poor diet and lack of exercise since A fib diagnosis. He will work on this and follow up 3-6 months. Foot exam done will get records from [facility] for eye exam. On statin already. Will check microalbumin to creatinine ratio at next lab in Jan with physical.

## 2023-04-15 NOTE — Progress Notes (Signed)
   Subjective:   Patient ID: Jermaine Soto, male    DOB: 01/05/1946, 77 y.o.   MRN: [id number]  HPI The patient is a 77 YO man coming in for follow up sugars. Is eating a lot of chocolate ice cream and not exercising due to a fib was scared to exercise.  [facility] for eyes  Review of Systems  Constitutional: Negative.   HENT: Negative.    Eyes: Negative.   Respiratory:  Negative for cough, chest tightness and shortness of breath.   Cardiovascular:  Negative for chest pain, palpitations and leg swelling.  Gastrointestinal:  Negative for abdominal distention, abdominal pain, constipation, diarrhea, nausea and vomiting.  Musculoskeletal: Negative.   Skin: Negative.   Neurological: Negative.   Psychiatric/Behavioral: Negative.      Objective:  Physical Exam Constitutional:      Appearance: He is well-developed.  HENT:     Head: Normocephalic and atraumatic.  Cardiovascular:     Rate and Rhythm: Normal rate and regular rhythm.  Pulmonary:     Effort: Pulmonary effort is normal. No respiratory distress.     Breath sounds: Normal breath sounds. No wheezing or rales.  Abdominal:     General: Bowel sounds are normal. There is no distension.     Palpations: Abdomen is soft.     Tenderness: There is no abdominal tenderness. There is no rebound.  Musculoskeletal:     Cervical back: Normal range of motion.  Skin:    General: Skin is warm and dry.  Neurological:     Mental Status: He is alert and oriented to person, place, and time.     Coordination: Coordination normal.     Vitals:   04/15/23 0847  BP: 118/80  Pulse: (!) 106  Temp: 98.5 F (36.9 C)  TempSrc: Oral  SpO2: 97%  Weight: 228 lb (103.4 kg)  Height: 5\' 10"  (1.778 m)    Assessment & Plan:

## 2023-04-16 ENCOUNTER — Telehealth: Payer: Self-pay | Admitting: *Deleted

## 2023-04-16 NOTE — Telephone Encounter (Signed)
   Pre-operative Risk Assessment    Patient Name: Jermaine Soto  DOB: 05/04/1946 MRN: [id number]    DATE OF LAST VISIT: 02/07/23 Robin Searing, NP DATE OF NEXT VISIT: NONE  Request for Surgical Clearance    Procedure:   VITRECTOMY  Date of Surgery:  Clearance 05/15/23                                 Surgeon:  DR. Fawn Kirk Surgeon's Group or Practice Name:  [facility] Phone number:  [phone] Fax number:  [phone]   Type of Clearance Requested:   - Medical  - Pharmacy:  Hold Apixaban (Eliquis) x 3 DAYS PRIOR AND RESUME THE OF SURGERY   Type of Anesthesia:  MAC & LOCAL   Additional requests/questions:    Elpidio Anis   04/16/2023, 1:00 PM

## 2023-04-16 NOTE — Telephone Encounter (Signed)
Please advise holding Eliquis prior to Vitrectomy on 12/4.  Thank you!  DW

## 2023-04-17 ENCOUNTER — Telehealth: Payer: Self-pay | Admitting: *Deleted

## 2023-04-17 NOTE — Telephone Encounter (Signed)
Patient with diagnosis of afib on Eliquis for anticoagulation.    Procedure: Vitrectomy  Date of procedure: 05/15/2023   CHA2DS2-VASc Score = 4   This indicates a 4.8% annual risk of stroke. The patient's score is based upon: CHF History: 0 HTN History: 0 Diabetes History: 1 Stroke History: 0 Vascular Disease History: 1 Age Score: 2 Gender Score: 0     CrCl 59 mL/min Platelet count 221 (11/12/2022)   Per office protocol, patient can hold Eliquis for 3 days prior to procedure.     **This guidance is not considered finalized until pre-operative APP has relayed final recommendations.**

## 2023-04-17 NOTE — Telephone Encounter (Signed)
Pt has been scheduled 05/01/23 tele pre op appt. Med rec and consent are done.

## 2023-04-17 NOTE — Telephone Encounter (Signed)
Was leaving message to call back to set up tele pre op appt when phone cut off.

## 2023-04-17 NOTE — Telephone Encounter (Signed)
   Name: Jermaine Soto  DOB: 06/21/1945  MRN: [id number]  Primary Cardiologist: Meriam Sprague, MD (Inactive)   Preoperative team, please contact this patient and set up a phone call appointment for further preoperative risk assessment. Please obtain consent and complete medication review. Thank you for your help.  I confirm that guidance regarding antiplatelet and oral anticoagulation therapy has been completed and, if necessary, noted below.  Per office protocol, patient can hold Eliquis for 3 days prior to procedure.    I also confirmed the patient resides in the state of [place]. As per [facility] Medical Board telemedicine laws, the patient must reside in the state in which the provider is licensed.   Napoleon Form, Leodis Rains, NP 04/17/2023, 11:34 AM [facility]

## 2023-04-17 NOTE — Telephone Encounter (Signed)
Pt has been scheduled 05/01/23 tele pre op appt. Med rec and consent are done.     Patient Consent for Virtual Visit        Griff Badley has provided verbal consent on 04/17/2023 for a virtual visit (video or telephone).   CONSENT FOR VIRTUAL VISIT FOR:  Jermaine Soto  By participating in this virtual visit I agree to the following:  I hereby voluntarily request, consent and authorize [facility] and its employed or contracted physicians, physician assistants, nurse practitioners or other licensed health care professionals (the Practitioner), to provide me with telemedicine health care services (the "Services") as deemed necessary by the treating Practitioner. I acknowledge and consent to receive the Services by the Practitioner via telemedicine. I understand that the telemedicine visit will involve communicating with the Practitioner through live audiovisual communication technology and the disclosure of certain medical information by electronic transmission. I acknowledge that I have been given the opportunity to request an in-person assessment or other available alternative prior to the telemedicine visit and am voluntarily participating in the telemedicine visit.  I understand that I have the right to withhold or withdraw my consent to the use of telemedicine in the course of my care at any time, without affecting my right to future care or treatment, and that the Practitioner or I may terminate the telemedicine visit at any time. I understand that I have the right to inspect all information obtained and/or recorded in the course of the telemedicine visit and may receive copies of available information for a reasonable fee.  I understand that some of the potential risks of receiving the Services via telemedicine include:  Delay or interruption in medical evaluation due to technological equipment failure or disruption; Information transmitted may not be sufficient (e.g. poor resolution of  images) to allow for appropriate medical decision making by the Practitioner; and/or  In rare instances, security protocols could fail, causing a breach of personal health information.  Furthermore, I acknowledge that it is my responsibility to provide information about my medical history, conditions and care that is complete and accurate to the best of my ability. I acknowledge that Practitioner's advice, recommendations, and/or decision may be based on factors not within their control, such as incomplete or inaccurate data provided by me or distortions of diagnostic images or specimens that may result from electronic transmissions. I understand that the practice of medicine is not an exact science and that Practitioner makes no warranties or guarantees regarding treatment outcomes. I acknowledge that a copy of this consent can be made available to me via my patient portal [facility] MyChart), or I can request a printed copy by calling the office of [facility].    I understand that my insurance will be billed for this visit.   I have read or had this consent read to me. I understand the contents of this consent, which adequately explains the benefits and risks of the Services being provided via telemedicine.  I have been provided ample opportunity to ask questions regarding this consent and the Services and have had my questions answered to my satisfaction. I give my informed consent for the services to be provided through the use of telemedicine in my medical care

## 2023-04-19 LAB — TESTOSTERONE,FREE AND TOTAL
Testosterone, Free: 1.6 pg/mL — ABNORMAL LOW (ref 6.6–18.1)
Testosterone: 319 ng/dL (ref 264–916)

## 2023-04-22 ENCOUNTER — Encounter: Payer: Self-pay | Admitting: "Endocrinology

## 2023-04-22 ENCOUNTER — Ambulatory Visit: Payer: Medicare HMO | Admitting: "Endocrinology

## 2023-04-22 VITALS — BP 120/74 | HR 104 | Ht 70.0 in | Wt 230.0 lb

## 2023-04-22 DIAGNOSIS — E230 Hypopituitarism: Secondary | ICD-10-CM

## 2023-04-22 DIAGNOSIS — E041 Nontoxic single thyroid nodule: Secondary | ICD-10-CM | POA: Diagnosis not present

## 2023-04-22 DIAGNOSIS — E038 Other specified hypothyroidism: Secondary | ICD-10-CM | POA: Diagnosis not present

## 2023-04-22 DIAGNOSIS — E236 Other disorders of pituitary gland: Secondary | ICD-10-CM | POA: Diagnosis not present

## 2023-04-22 NOTE — Progress Notes (Signed)
Patient ID: Jermaine Soto, male   DOB: 03-Dec-1945, 77 y.o.   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  Had low TSH with low Free T4 The patient has been treated with synthroid since 2016, for hypothyroidism. Prior to endocrinology consultation he was being treated by PCP with low doses of levothyroxine between 50 and 75 mcg This was subsequently increased when the diagnosis of SECONDARY hypothyroidism was made during his hospitalization in 06/2020.  He is currently on stable dose of 100 mcg of levothyroxine. Takes appropriately.  He has a thyroid nodule as well. Conclusion of ultrasound on 07/17/2021 showed:  Nodule 3 (TI-RADS 4), now measures 1.4 x 1.4 x 1.1 cm compared to 1.2 x 1.0 x 0.9 cm on the prior exam. It is located in the inferior right thyroid lobe and meets criteria for imaging follow-up. Annual ultrasound surveillance is recommended until 5 years of stability is documented.  12/2022 thyroid U/S showed: Heterogeneous appearance of the thyroid gland. A single nodule in the mid right thyroid lobe identified on today's exam (1.0 cm TR 4) meets criteria for follow-up ultrasound in 1 year. This exam marks approximately 2.5 year stability dating back to February 2022. A total follow-up interval of 5 years is recommended.  Reports feeling pretty good. No dysphagia/dysphonia/dyspnea. Weigh is stable.   Wt Readings from Last 3 Encounters:  04/22/23 230 lb (104.3 kg)  04/15/23 228 lb (103.4 kg)  02/07/23 230 lb 6.4 oz (104.5 kg)    Lab Results  Component Value Date   TSH 0.04 (L) 08/06/2022   TSH 0.04 (L) 11/09/2021   TSH 0.03 (L) 07/10/2021   FREET4 1.15 04/15/2023   FREET4 1.06 12/18/2022   FREET4 1.08 08/06/2022   ADRENAL insufficiency:  In  January 2022 he was admitted with symptoms of a stroke and sodium of 121. Although he was not showing any hypotension he had significantly low cortisol levels. Subsequently has been taking only 5 mg prednisone daily. He does not complain of any nausea, lightheadedness decreased appetite. Sodium has been consistently normal.  Wt Readings from Last 3 Encounters:  04/22/23 230 lb (104.3 kg)  04/15/23 228 lb (103.4 kg)  02/07/23 230 lb 6.4 oz (104.5 kg)    TESTOSTERONE deficiency: Although he has had 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    He was recommended testosterone supplementations because of his very low testosterone level as of June 2023. He started using Axiron in June 2023 and prefers taking 1 pump under each arm. Feels good on testosterone supplements, previously reported: been able to exercise more regularly and he thinks he is building up more muscle in his upper body. Not sexually active but has good libido.  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 This was measuring 1.4 cm with some suprasellar extension. However, follow-up on 08/31/2021 showed the cyst to be about 3 to 4 mm only (full current dimension 4 mm*7*31mm).   Allergies as of 04/22/2023       Reactions   Sulfa Antibiotics    Unknown   Tape Itching  Medication List        Accurate as of April 22, 2023  9:59 AM. If you have any questions, ask your nurse or doctor.          apixaban 5 MG Tabs tablet Commonly known as: Eliquis Take 1 tablet (5 mg total) by mouth 2 (two) times daily.   cholecalciferol 25 MCG (1000 UNIT) tablet Commonly known as: VITAMIN D3 Take 1,000 Units by mouth daily.   levothyroxine 100 MCG tablet Commonly known as: SYNTHROID Take 1 tablet (100 mcg total) by mouth daily.    metoprolol tartrate 50 MG tablet Commonly known as: LOPRESSOR Take 1 tablet (50 mg total) by mouth 2 (two) times daily.   predniSONE 5 MG tablet Commonly known as: DELTASONE Take 1 tablet (5 mg total) by mouth daily with breakfast.   PRESERVISION AREDS 2 PO Take 1 capsule by mouth in the morning and at bedtime.   rosuvastatin 20 MG tablet Commonly known as: CRESTOR Take 1 tablet (20 mg total) by mouth daily.   Testosterone 30 MG/ACT Soln Dispense liquid into application cup, apply in the armpits, once on each side        Allergies:  Allergies  Allergen Reactions   Sulfa Antibiotics     Unknown   Tape Itching    Past Medical History:  Diagnosis Date   Arthritis    hands    Hypothyroidism     Past Surgical History:  Procedure Laterality Date   CARDIOVERSION N/A 11/13/2022   Procedure: CARDIOVERSION;  Surgeon: [name], MD;  Location: [facility] INVASIVE CV LAB;  Service: Cardiovascular;  Laterality: N/A;   IR ANGIO INTRA EXTRACRAN SEL COM CAROTID INNOMINATE BILAT MOD SED  06/15/2020   IR ANGIO VERTEBRAL SEL VERTEBRAL BILAT MOD SED  06/15/2020   RADIOLOGY WITH ANESTHESIA N/A 06/15/2020   Procedure: IR WITH ANESTHESIA;  Surgeon: Radiologist, Medication, MD;  Location: [facility] OR;  Service: Radiology;  Laterality: N/A;   ROBOT ASSISTED LAPAROSCOPIC RADICAL PROSTATECTOMY N/A 12/24/2012   Procedure: ROBOTIC ASSISTED LAPAROSCOPIC RADICAL PROSTATECTOMY;  Surgeon: Valetta Fuller, MD;  Location: [facility] ORS;  Service: Urology;  Laterality: N/A;    Family History  Problem Relation Age of Onset   Thyroid disease Neg Hx     Social History:  reports that he has never smoked. He has never used smokeless tobacco. He reports current alcohol use. He reports that he does not use drugs.  Review of Systems Same as above   Examination:   BP 120/74   Pulse (!) 104   Ht 5\' 10"  (1.778 m)   Wt 230 lb (104.3 kg)   SpO2 99%   BMI 33.00 kg/m   Physical Exam Constitutional:       Appearance: Normal appearance. Not ill-appearing.  HENT:     Head: Normocephalic and atraumatic.  Eyes:     General: No scleral icterus.    Conjunctiva/sclera: Conjunctivae normal.  Pulmonary:     Effort: Pulmonary effort is normal. No respiratory distress.  Musculoskeletal:     Cervical back: Normal range of motion. No rigidity.  Skin:    Coloration: Skin is not jaundiced.     Findings: No rash.  Neurological:     Mental Status: Alert and oriented to person, place, and time. Psychiatric:        Behavior: Behavior normal.        Judgment: Judgment normal.    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 Free T4 is in a good range and stable with 100 mcg supplementation  ADRENAL insufficiency, partial: Subjectively the doing well with only 5 mg of prednisone in the morning without any weakness, nausea or low sodium.  HYPOGONADISM: He has history of significant hypogonadism. Although his testosterone level is low normal he is not apply the medication the day he came in and also may not be using the proper technique to apply the Axiron solution.  Treatment:  No change in medication regimen. Discussed application technique for Testosterone. On Testosterone 30 MG/ACT SOLN. Stress doses of up to 10 mg  prednisone if he has any acute illness/surgery for the duration of illness. Continue taking levothyroxine before breakfast unchanged. Labs today (has not applied testosterone today so it will be a trough reading). Next time asked to do lab after 3-4 hours after testosterone application for peak reading.  Follow up US thyroid ordered per TR4 nodule follow up recommendation: 1, 2, 3 and 5 years  Patient instruction: You cannot make your own cortisol (stress hormone).  It is very important that you prednisone every day at the times we describe.  It can be very dangerous if you miss a dose. If you get sick with nausea and vomiting and cannot keep the medications down, you should come to the ER for IV steroids.  If you get sick with a cold or flu, double your dose for three days, then drop back down to your regular dose. Consider updating diagnosis of "adrenal insufficiency" in your phone emergency history and wear medical bracelet stating the same.   Return in about 4 months (around 08/20/2023) for visit and 8 am labs before next visit.   Anysia Choi 04/22/2023, 9:59 AM   Note: This office note was prepared with Dragon voice recognition system technology. Any transcriptional errors that result from this process are unintentional.

## 2023-04-30 NOTE — Progress Notes (Unsigned)
Virtual Visit via Telephone Note   Because of Jermaine Soto's co-morbid illnesses, he is at least at moderate risk for complications without adequate follow up.  This format is felt to be most appropriate for this patient at this time.  The patient did not have access to video technology/had technical difficulties with video requiring transitioning to audio format only (telephone).  All issues noted in this document were discussed and addressed.  No physical exam could be performed with this format.  Please refer to the patient's chart for his consent to telehealth for [facility].  Evaluation Performed:  Preoperative cardiovascular risk assessment _____________   Date:  05/01/2023   Patient ID:  Jermaine Soto, DOB [birth year], MRN [id number] Patient Location:  Home Provider location:   Office  Primary Care Provider:  Myrlene Broker, MD Primary Cardiologist:  Meriam Sprague, MD (Inactive)  Chief Complaint / Patient Profile  77 y.o. y/o male with a h/o PAF, hyponatremia, HLD, prostate CA s/p prostatectomy, SIADH who is [facility] and presents today for telephonic preoperative cardiovascular risk assessment. History of Present Illness   Jermaine Soto is a 77 y.o. male who presents via audio/video conferencing for a telehealth visit today.  Pt was last seen in cardiology clinic on 02/07/23 by Robin Searing, NP.  At that time Jermaine Soto was doing well.  The patient is now pending procedure as outlined above. Since his last visit, he has remained stable from a cardiac perspective. He denies chest pain, palpitations, dyspnea, pnd, orthopnea, n, v, dizziness, syncope, edema, weight gain, or early satiety.  Past Medical History    Past Medical History:  Diagnosis Date   Arthritis    hands    Hypothyroidism    Past Surgical History:  Procedure Laterality Date   CARDIOVERSION N/A 11/13/2022   Procedure: CARDIOVERSION;   Surgeon: [name], MD;  Location: [facility]VASIVE CV LAB;  Service: Cardiovascular;  Laterality: N/A;   IR ANGIO INTRA EXTRACRAN SEL COM CAROTID INNOMINATE BILAT MOD SED  06/15/2020   IR ANGIO VERTEBRAL SEL VERTEBRAL BILAT MOD SED  06/15/2020   RADIOLOGY WITH ANESTHESIA N/A 06/15/2020   Procedure: IR WITH ANESTHESIA;  Surgeon: Radiologist, Medication, MD;  Location: [facility] OR;  Service: Radiology;  Laterality: N/A;   ROBOT ASSISTED LAPAROSCOPIC RADICAL PROSTATECTOMY N/A 12/24/2012   Procedure: ROBOTIC ASSISTED LAPAROSCOPIC RADICAL PROSTATECTOMY;  Surgeon: Valetta Fuller, MD;  Location: [facility] ORS;  Service: Urology;  Laterality: N/A;   Allergies Allergies  Allergen Reactions   Sulfa Antibiotics     Unknown   Tape Itching   Home Medications    Prior to Admission medications   Medication Sig Start Date End Date Taking? Authorizing Provider  apixaban (ELIQUIS) 5 MG TABS tablet Take 1 tablet (5 mg total) by mouth 2 (two) times daily. 04/19/22   Meriam Sprague, MD  cholecalciferol (VITAMIN D3) 25 MCG (1000 UNIT) tablet Take 1,000 Units by mouth daily.    [provider]  levothyroxine (SYNTHROID) 100 MCG tablet Take 1 tablet (100 mcg total) by mouth daily. 12/18/22   Altamese Whitney, MD  metoprolol tartrate (LOPRESSOR) 50 MG tablet Take 1 tablet (50 mg total) by mouth 2 (two) times daily. 02/07/23   Gaston Islam., NP  Multiple Vitamins-Minerals (PRESERVISION AREDS 2 PO) Take 1 capsule by mouth in the morning and at bedtime.    [provider]  predniSONE (DELTASONE) 5 MG tablet Take  1 tablet (5 mg total) by mouth daily with breakfast. 07/18/22   Reather Littler, MD  rosuvastatin (CRESTOR) 20 MG tablet Take 1 tablet (20 mg total) by mouth daily. 11/02/22   Meriam Sprague, MD  Testosterone 30 MG/ACT SOLN Dispense liquid into application cup, apply in the armpits, once on each side 03/22/23   Altamese [place], MD   Physical Exam    Vital Signs:  Jermaine Soto does not have vital  signs available for review today.  Given telephonic nature of communication, physical exam is limited. AAOx3. NAD. Normal affect.  Speech and respirations are unlabored.  Accessory Clinical Findings   None Assessment & Plan    1.  Preoperative Cardiovascular Risk Assessment:[facility] Jermaine Soto's perioperative risk of a major cardiac event is 0.4% according to the Revised Cardiac Risk Index (RCRI).  Therefore, he is at low risk for perioperative complications.   His functional capacity is good at 7.59 METs according to the Duke Activity Status Index (DASI). Recommendations: According to ACC/AHA guidelines, no further cardiovascular testing needed.  The patient may proceed to surgery at acceptable risk.   Antiplatelet and/or Anticoagulation Recommendations: Per office protocol Eliquis (Apixaban) can be held for 3 days prior to surgery.  Please resume post op when felt to be safe.     The patient was advised that if he develops new symptoms prior to surgery to contact our office to arrange for a follow-up visit, and he verbalized understanding.  A copy of this note will be routed to requesting surgeon.  Time:   Today, I have spent 9 minutes with the patient with telehealth technology discussing medical history, symptoms, and management plan.    Rip Harbour, NP  05/01/2023, 2:09 PM

## 2023-05-01 ENCOUNTER — Ambulatory Visit: Payer: Medicare HMO | Attending: Cardiovascular Disease | Admitting: Cardiology

## 2023-05-01 DIAGNOSIS — Z01810 Encounter for preprocedural cardiovascular examination: Secondary | ICD-10-CM | POA: Diagnosis not present

## 2023-05-15 DIAGNOSIS — H35372 Puckering of macula, left eye: Secondary | ICD-10-CM | POA: Diagnosis not present

## 2023-05-22 DIAGNOSIS — H35372 Puckering of macula, left eye: Secondary | ICD-10-CM | POA: Diagnosis not present

## 2023-05-22 DIAGNOSIS — H353132 Nonexudative age-related macular degeneration, bilateral, intermediate dry stage: Secondary | ICD-10-CM | POA: Diagnosis not present

## 2023-05-23 DIAGNOSIS — R311 Benign essential microscopic hematuria: Secondary | ICD-10-CM | POA: Diagnosis not present

## 2023-05-23 DIAGNOSIS — Z8546 Personal history of malignant neoplasm of prostate: Secondary | ICD-10-CM | POA: Diagnosis not present

## 2023-06-03 ENCOUNTER — Other Ambulatory Visit: Payer: Self-pay | Admitting: Pharmacist

## 2023-06-03 DIAGNOSIS — I4891 Unspecified atrial fibrillation: Secondary | ICD-10-CM

## 2023-06-03 MED ORDER — APIXABAN 5 MG PO TABS
5.0000 mg | ORAL_TABLET | Freq: Two times a day (BID) | ORAL | 1 refills | Status: DC
Start: 2023-06-03 — End: 2023-11-29

## 2023-06-15 ENCOUNTER — Other Ambulatory Visit: Payer: Self-pay | Admitting: "Endocrinology

## 2023-06-15 DIAGNOSIS — E230 Hypopituitarism: Secondary | ICD-10-CM

## 2023-06-24 DIAGNOSIS — R319 Hematuria, unspecified: Secondary | ICD-10-CM | POA: Diagnosis not present

## 2023-06-24 DIAGNOSIS — R311 Benign essential microscopic hematuria: Secondary | ICD-10-CM | POA: Diagnosis not present

## 2023-06-24 DIAGNOSIS — K4020 Bilateral inguinal hernia, without obstruction or gangrene, not specified as recurrent: Secondary | ICD-10-CM | POA: Diagnosis not present

## 2023-06-24 DIAGNOSIS — K5730 Diverticulosis of large intestine without perforation or abscess without bleeding: Secondary | ICD-10-CM | POA: Diagnosis not present

## 2023-06-24 DIAGNOSIS — K429 Umbilical hernia without obstruction or gangrene: Secondary | ICD-10-CM | POA: Diagnosis not present

## 2023-06-25 DIAGNOSIS — L820 Inflamed seborrheic keratosis: Secondary | ICD-10-CM | POA: Diagnosis not present

## 2023-06-25 DIAGNOSIS — L853 Xerosis cutis: Secondary | ICD-10-CM | POA: Diagnosis not present

## 2023-06-25 DIAGNOSIS — C44319 Basal cell carcinoma of skin of other parts of face: Secondary | ICD-10-CM | POA: Diagnosis not present

## 2023-06-25 DIAGNOSIS — L538 Other specified erythematous conditions: Secondary | ICD-10-CM | POA: Diagnosis not present

## 2023-06-25 DIAGNOSIS — D485 Neoplasm of uncertain behavior of skin: Secondary | ICD-10-CM | POA: Diagnosis not present

## 2023-06-25 DIAGNOSIS — Z85828 Personal history of other malignant neoplasm of skin: Secondary | ICD-10-CM | POA: Diagnosis not present

## 2023-06-25 DIAGNOSIS — D225 Melanocytic nevi of trunk: Secondary | ICD-10-CM | POA: Diagnosis not present

## 2023-06-25 DIAGNOSIS — L821 Other seborrheic keratosis: Secondary | ICD-10-CM | POA: Diagnosis not present

## 2023-06-25 DIAGNOSIS — Z08 Encounter for follow-up examination after completed treatment for malignant neoplasm: Secondary | ICD-10-CM | POA: Diagnosis not present

## 2023-06-25 DIAGNOSIS — L570 Actinic keratosis: Secondary | ICD-10-CM | POA: Diagnosis not present

## 2023-06-25 DIAGNOSIS — L814 Other melanin hyperpigmentation: Secondary | ICD-10-CM | POA: Diagnosis not present

## 2023-06-25 DIAGNOSIS — L2989 Other pruritus: Secondary | ICD-10-CM | POA: Diagnosis not present

## 2023-07-08 DIAGNOSIS — Z8546 Personal history of malignant neoplasm of prostate: Secondary | ICD-10-CM | POA: Diagnosis not present

## 2023-07-08 DIAGNOSIS — R311 Benign essential microscopic hematuria: Secondary | ICD-10-CM | POA: Diagnosis not present

## 2023-07-10 ENCOUNTER — Other Ambulatory Visit: Payer: Self-pay | Admitting: Nurse Practitioner

## 2023-07-15 ENCOUNTER — Ambulatory Visit: Payer: Medicare HMO | Admitting: Internal Medicine

## 2023-07-15 ENCOUNTER — Other Ambulatory Visit: Payer: Self-pay

## 2023-07-15 DIAGNOSIS — E230 Hypopituitarism: Secondary | ICD-10-CM

## 2023-07-15 MED ORDER — PREDNISONE 5 MG PO TABS: 5.0000 mg | ORAL_TABLET | Freq: Every day | ORAL | 3 refills | Status: DC

## 2023-07-16 DIAGNOSIS — C44319 Basal cell carcinoma of skin of other parts of face: Secondary | ICD-10-CM | POA: Diagnosis not present

## 2023-07-17 DIAGNOSIS — H353132 Nonexudative age-related macular degeneration, bilateral, intermediate dry stage: Secondary | ICD-10-CM | POA: Diagnosis not present

## 2023-07-17 DIAGNOSIS — H35372 Puckering of macula, left eye: Secondary | ICD-10-CM | POA: Diagnosis not present

## 2023-07-22 ENCOUNTER — Ambulatory Visit: Payer: Medicare HMO | Admitting: Internal Medicine

## 2023-07-22 ENCOUNTER — Encounter: Payer: Self-pay | Admitting: Internal Medicine

## 2023-07-22 VITALS — BP 122/86 | HR 94 | Temp 97.9°F | Ht 70.0 in | Wt 231.0 lb

## 2023-07-22 DIAGNOSIS — I480 Paroxysmal atrial fibrillation: Secondary | ICD-10-CM | POA: Diagnosis not present

## 2023-07-22 DIAGNOSIS — I4819 Other persistent atrial fibrillation: Secondary | ICD-10-CM | POA: Diagnosis not present

## 2023-07-22 DIAGNOSIS — E2749 Other adrenocortical insufficiency: Secondary | ICD-10-CM

## 2023-07-22 DIAGNOSIS — E785 Hyperlipidemia, unspecified: Secondary | ICD-10-CM

## 2023-07-22 DIAGNOSIS — E236 Other disorders of pituitary gland: Secondary | ICD-10-CM | POA: Diagnosis not present

## 2023-07-22 DIAGNOSIS — E038 Other specified hypothyroidism: Secondary | ICD-10-CM | POA: Diagnosis not present

## 2023-07-22 DIAGNOSIS — E118 Type 2 diabetes mellitus with unspecified complications: Secondary | ICD-10-CM

## 2023-07-22 DIAGNOSIS — E230 Hypopituitarism: Secondary | ICD-10-CM | POA: Diagnosis not present

## 2023-07-22 DIAGNOSIS — Z0000 Encounter for general adult medical examination without abnormal findings: Secondary | ICD-10-CM | POA: Diagnosis not present

## 2023-07-22 DIAGNOSIS — E1169 Type 2 diabetes mellitus with other specified complication: Secondary | ICD-10-CM | POA: Insufficient documentation

## 2023-07-22 DIAGNOSIS — D6869 Other thrombophilia: Secondary | ICD-10-CM

## 2023-07-22 DIAGNOSIS — I700 Atherosclerosis of aorta: Secondary | ICD-10-CM | POA: Diagnosis not present

## 2023-07-22 LAB — COMPREHENSIVE METABOLIC PANEL
ALT: 16 U/L (ref 0–53)
AST: 16 U/L (ref 0–37)
Albumin: 4.4 g/dL (ref 3.5–5.2)
Alkaline Phosphatase: 36 U/L — ABNORMAL LOW (ref 39–117)
BUN: 20 mg/dL (ref 6–23)
CO2: 29 meq/L (ref 19–32)
Calcium: 9.8 mg/dL (ref 8.4–10.5)
Chloride: 101 meq/L (ref 96–112)
Creatinine, Ser: 1.19 mg/dL (ref 0.40–1.50)
GFR: 58.88 mL/min — ABNORMAL LOW (ref >60.00)
Glucose, Bld: 130 mg/dL — ABNORMAL HIGH (ref 70–99)
Potassium: 5.8 meq/L — ABNORMAL HIGH (ref 3.5–5.1)
Sodium: 138 meq/L (ref 135–145)
Total Bilirubin: 0.8 mg/dL (ref 0.2–1.2)
Total Protein: 7.2 g/dL (ref 6.0–8.3)

## 2023-07-22 LAB — CBC
HCT: 49.9 % (ref 39.0–52.0)
Hemoglobin: 16.4 g/dL (ref 13.0–17.0)
MCHC: 33 g/dL (ref 30.0–36.0)
MCV: 98.3 fL (ref 78.0–100.0)
Platelets: 207 10*3/uL (ref 150.0–400.0)
RBC: 5.07 Mil/uL (ref 4.22–5.81)
RDW: 14.2 % (ref 11.5–15.5)
WBC: 11.2 10*3/uL — ABNORMAL HIGH (ref 4.0–10.5)

## 2023-07-22 LAB — LIPID PANEL
Cholesterol: 112 mg/dL (ref 0–200)
HDL: 55.9 mg/dL (ref >39.00)
LDL Cholesterol: 33 mg/dL (ref 0–99)
NonHDL: 56.49
Total CHOL/HDL Ratio: 2
Triglycerides: 115 mg/dL (ref 0.0–149.0)
VLDL: 23 mg/dL (ref 0.0–40.0)

## 2023-07-22 LAB — HEMOGLOBIN A1C: Hgb A1c MFr Bld: 7 % — ABNORMAL HIGH (ref 4.6–6.5)

## 2023-07-22 NOTE — Progress Notes (Signed)
Subjective:   Patient ID: Jermaine Soto, male    DOB: 10/02/1945, 78 y.o.   MRN: [id number]  HPI Here for medicare wellness and physical, no new complaints. Please see A/P for status and treatment of chronic medical problems.   Diet: DM since diabetic Physical activity: sedentary Depression/mood screen: negative Hearing: intact to whispered voice Visual acuity: grossly normal, up to date on annual eye exam  ADLs: capable Fall risk: none Home safety: good Cognitive evaluation: intact to orientation, naming, recall and repetition EOL planning: adv directives discussed  Flowsheet Row Office Visit from 10/10/2022 in [facility] at [place]  PHQ-2 Total Score 0       Flowsheet Row Office Visit from 10/10/2022 in [facility] at [place]  PHQ-9 Total Score 0         06/19/2020    7:47 PM 10/10/2022    8:20 AM 10/10/2022    8:23 AM 04/15/2023    8:50 AM 07/22/2023    2:49 PM  Fall Risk  Falls in the past year?  0 0 0 0  Was there an injury with Fall?  0 0 0 0  Fall Risk Category Calculator  0 0 0 0  (RETIRED) Patient Fall Risk Level High fall risk      Fall risk Follow up  Falls evaluation completed Falls evaluation completed Falls evaluation completed Falls evaluation completed    I have personally reviewed and have noted 1. The patient's medical and social history - reviewed today no changes 2. Their use of alcohol, tobacco or illicit drugs 3. Their current medications and supplements 4. The patient's functional ability including ADL's, fall risks, home safety risks and hearing or visual impairment. 5. Diet and physical activities 6. Evidence for depression or mood disorders 7. Care team reviewed and updated 8.  The patient is not on an opioid pain medication   Patient Care Team: Myrlene Broker, MD as PCP - General (Internal Medicine) Meriam Sprague, MD (Inactive) as PCP - Cardiology (Cardiology) Past Medical History:   Diagnosis Date   Arthritis    hands    Hypothyroidism    Past Surgical History:  Procedure Laterality Date   CARDIOVERSION N/A 11/13/2022   Procedure: CARDIOVERSION;  Surgeon: [name], MD;  Location: [facility] INVASIVE CV LAB;  Service: Cardiovascular;  Laterality: N/A;   IR ANGIO INTRA EXTRACRAN SEL COM CAROTID INNOMINATE BILAT MOD SED  06/15/2020   IR ANGIO VERTEBRAL SEL VERTEBRAL BILAT MOD SED  06/15/2020   RADIOLOGY WITH ANESTHESIA N/A 06/15/2020   Procedure: IR WITH ANESTHESIA;  Surgeon: Radiologist, Medication, MD;  Location: [facility] OR;  Service: Radiology;  Laterality: N/A;   ROBOT ASSISTED LAPAROSCOPIC RADICAL PROSTATECTOMY N/A 12/24/2012   Procedure: ROBOTIC ASSISTED LAPAROSCOPIC RADICAL PROSTATECTOMY;  Surgeon: Valetta Fuller, MD;  Location: [facility] ORS;  Service: Urology;  Laterality: N/A;   Family History  Problem Relation Age of Onset   Thyroid disease Neg Hx    Review of Systems  Constitutional: Negative.   HENT: Negative.    Eyes: Negative.   Respiratory:  Negative for cough, chest tightness and shortness of breath.   Cardiovascular:  Negative for chest pain, palpitations and leg swelling.  Gastrointestinal:  Negative for abdominal distention, abdominal pain, constipation, diarrhea, nausea and vomiting.  Musculoskeletal: Negative.   Skin: Negative.   Neurological: Negative.   Psychiatric/Behavioral: Negative.      Objective:  Physical Exam Constitutional:      Appearance: He is  well-developed.  HENT:     Head: Normocephalic and atraumatic.  Cardiovascular:     Rate and Rhythm: Normal rate and regular rhythm.  Pulmonary:     Effort: Pulmonary effort is normal. No respiratory distress.     Breath sounds: Normal breath sounds. No wheezing or rales.  Abdominal:     General: Bowel sounds are normal. There is no distension.     Palpations: Abdomen is soft.     Tenderness: There is no abdominal tenderness. There is no rebound.  Musculoskeletal:     Cervical back:  Normal range of motion.  Skin:    General: Skin is warm and dry.  Neurological:     Mental Status: He is alert and oriented to person, place, and time.     Coordination: Coordination normal.     Vitals:   07/22/23 1441  BP: 122/86  Pulse: 94  Temp: 97.9 F (36.6 C)  TempSrc: Oral  SpO2: 99%  Weight: 231 lb (104.8 kg)  Height: 5\' 10"  (1.778 m)    Assessment & Plan:

## 2023-07-22 NOTE — Patient Instructions (Signed)
  Mr. Jermaine Soto , Thank you for taking time to come for your Medicare Wellness Visit. I appreciate your ongoing commitment to your health goals. Please review the following plan we discussed and let me know if I can assist you in the future.   These are the goals we discussed:  Goals   None     This is a list of the screening recommended for you and due dates:  Health Maintenance  Topic Date Due   Eye exam for diabetics  Never done   Yearly kidney health urinalysis for diabetes  Never done   Hepatitis C Screening  Never done   Zoster (Shingles) Vaccine (1 of 2) Never done   DTaP/Tdap/Td vaccine (2 - Td or Tdap) 07/11/2022   COVID-19 Vaccine (1 - 2024-25 season) Never done   Medicare Annual Wellness Visit  04/11/2023   Flu Shot  09/09/2023*   Hemoglobin A1C  10/13/2023   Yearly kidney function blood test for diabetes  04/14/2024   Complete foot exam   04/14/2024   Pneumonia Vaccine  Completed   HPV Vaccine  Aged Out   Colon Cancer Screening  Discontinued  *Topic was postponed. The date shown is not the original due date.

## 2023-07-23 ENCOUNTER — Other Ambulatory Visit (INDEPENDENT_AMBULATORY_CARE_PROVIDER_SITE_OTHER): Payer: Medicare HMO

## 2023-07-23 ENCOUNTER — Other Ambulatory Visit: Payer: Self-pay | Admitting: Internal Medicine

## 2023-07-23 ENCOUNTER — Encounter: Payer: Self-pay | Admitting: Internal Medicine

## 2023-07-23 DIAGNOSIS — E875 Hyperkalemia: Secondary | ICD-10-CM

## 2023-07-23 LAB — BASIC METABOLIC PANEL
BUN: 19 mg/dL (ref 6–23)
CO2: 31 meq/L (ref 19–32)
Calcium: 9.6 mg/dL (ref 8.4–10.5)
Chloride: 100 meq/L (ref 96–112)
Creatinine, Ser: 1.18 mg/dL (ref 0.40–1.50)
GFR: 59.48 mL/min — ABNORMAL LOW (ref >60.00)
Glucose, Bld: 131 mg/dL — ABNORMAL HIGH (ref 70–99)
Potassium: 5.8 meq/L — ABNORMAL HIGH (ref 3.5–5.1)
Sodium: 138 meq/L (ref 135–145)

## 2023-07-23 LAB — MICROALBUMIN / CREATININE URINE RATIO
Creatinine,U: 72.1 mg/dL
Microalb Creat Ratio: 38.3 mg/g — ABNORMAL HIGH (ref 0.0–30.0)
Microalb, Ur: 2.8 mg/dL — ABNORMAL HIGH (ref 0.0–1.9)

## 2023-07-26 ENCOUNTER — Telehealth: Payer: Self-pay

## 2023-07-26 NOTE — Telephone Encounter (Signed)
Copied from CRM [id number]. Topic: Clinical - Request for Lab/Test Order >> Jul 26, 2023  8:44 AM Sim Boast F wrote: Reason for CRM: Patient spouse called sad she wants to schedule lab for potassium check on Monday but there is no order in

## 2023-07-26 NOTE — Assessment & Plan Note (Signed)
Metoprolol for rate control and eliquis for stroke prevention. Stable.

## 2023-07-26 NOTE — Assessment & Plan Note (Signed)
Seeing endo for labs continue levothyroxine.

## 2023-07-26 NOTE — Telephone Encounter (Signed)
Spoke with patient wife and would like for labs to be put in for another lab to have potassium done again

## 2023-07-26 NOTE — Assessment & Plan Note (Signed)
Flu shot declines. Pneumonia up to date. Shingrix due at pharmacy. Tetanus due at pharmacy. Colonoscopy up to date. Counseled about sun safety and mole surveillance. Counseled about the dangers of distracted driving. Given 10 year screening recommendations.

## 2023-07-26 NOTE — Assessment & Plan Note (Signed)
Taking eliquis and no clinical signs of bleeding. Checking CBC.

## 2023-07-26 NOTE — Telephone Encounter (Signed)
Called patient wife back and the call keeps dropping

## 2023-07-26 NOTE — Assessment & Plan Note (Signed)
Taking testosterone and levothyroxine and prednisone. Seeing endo for labs and management.

## 2023-07-26 NOTE — Assessment & Plan Note (Signed)
Checking lipid panel and adjust crestor 20 mg daily as needed.

## 2023-07-26 NOTE — Assessment & Plan Note (Signed)
Taking crestor 20 mg daily and will continue.

## 2023-07-26 NOTE — Assessment & Plan Note (Signed)
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 Seeing endo for labs.

## 2023-07-26 NOTE — Assessment & Plan Note (Signed)
Checking HgA1c and lipid panel and microalbumin to creatinine ratio. On prednisone chronically which exacerbates sugars. Adjust as needed diet controlled. On statin.

## 2023-07-26 NOTE — Telephone Encounter (Signed)
He was advised to go to ER urgently. This is emergent to inform!

## 2023-07-26 NOTE — Assessment & Plan Note (Signed)
With hypopituitarism.

## 2023-07-29 DIAGNOSIS — H0011 Chalazion right upper eyelid: Secondary | ICD-10-CM | POA: Diagnosis not present

## 2023-07-29 DIAGNOSIS — Z961 Presence of intraocular lens: Secondary | ICD-10-CM | POA: Diagnosis not present

## 2023-07-29 DIAGNOSIS — H43811 Vitreous degeneration, right eye: Secondary | ICD-10-CM | POA: Diagnosis not present

## 2023-07-29 DIAGNOSIS — H353131 Nonexudative age-related macular degeneration, bilateral, early dry stage: Secondary | ICD-10-CM | POA: Diagnosis not present

## 2023-07-29 DIAGNOSIS — H40023 Open angle with borderline findings, high risk, bilateral: Secondary | ICD-10-CM | POA: Diagnosis not present

## 2023-08-08 ENCOUNTER — Other Ambulatory Visit: Payer: Self-pay

## 2023-08-08 DIAGNOSIS — E236 Other disorders of pituitary gland: Secondary | ICD-10-CM

## 2023-08-13 ENCOUNTER — Other Ambulatory Visit: Payer: Medicare HMO

## 2023-08-13 ENCOUNTER — Ambulatory Visit
Admission: RE | Admit: 2023-08-13 | Discharge: 2023-08-13 | Disposition: A | Discharge status: Home / Self Care | Payer: Medicare HMO | Source: Ambulatory Visit | Attending: "Endocrinology | Admitting: "Endocrinology

## 2023-08-13 ENCOUNTER — Other Ambulatory Visit: Payer: Self-pay

## 2023-08-13 DIAGNOSIS — E236 Other disorders of pituitary gland: Secondary | ICD-10-CM

## 2023-08-13 DIAGNOSIS — E230 Hypopituitarism: Secondary | ICD-10-CM | POA: Diagnosis not present

## 2023-08-13 MED ORDER — GADOPICLENOL 0.5 MMOL/ML IV SOLN
10.0000 mL | Freq: Once | INTRAVENOUS | Status: AC | PRN
  Administered 2023-08-13: 10 mL via INTRAVENOUS

## 2023-08-18 LAB — PROLACTIN: Prolactin: 13.9 ng/mL (ref 2.0–18.0)

## 2023-08-18 LAB — TESTOSTERONE, FREE & TOTAL
Free Testosterone: 74.6 pg/mL (ref 30.0–135.0)
Testosterone, Total, LC-MS-MS: 641 ng/dL (ref 250–1100)

## 2023-08-18 LAB — BASIC METABOLIC PANEL
BUN: 18 mg/dL (ref 7–25)
CO2: 20 mmol/L (ref 20–32)
Calcium: 9.1 mg/dL (ref 8.6–10.3)
Chloride: 105 mmol/L (ref 98–110)
Creat: 1.12 mg/dL (ref 0.70–1.28)
Glucose, Bld: 92 mg/dL (ref 65–99)
Potassium: 3.9 mmol/L (ref 3.5–5.3)
Sodium: 140 mmol/L (ref 135–146)

## 2023-08-18 LAB — TSH: TSH: 0.02 m[IU]/L — ABNORMAL LOW (ref 0.40–4.50)

## 2023-08-18 LAB — INSULIN-LIKE GROWTH FACTOR
IGF-I, LC/MS: 21 ng/mL — ABNORMAL LOW (ref 34–245)
Z-Score (Male): -2.7 {STDV} — ABNORMAL LOW (ref ?–2.0)

## 2023-08-18 LAB — T4, FREE: Free T4: 1.6 ng/dL (ref 0.8–1.8)

## 2023-08-18 LAB — GROWTH HORMONE: Growth Hormone: <0.1 ng/mL (ref <=7.1)

## 2023-08-18 LAB — CORTISOL: Cortisol, Plasma: 0.5 ug/dL — ABNORMAL LOW

## 2023-08-18 LAB — ACTH: C206 ACTH: <5 pg/mL — ABNORMAL LOW (ref 6–50)

## 2023-08-18 NOTE — Progress Notes (Unsigned)
 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  Mr. Galindo was last seen in office on 02/07/2023 and was doing well at that time with no new cardiac complaints.  His blood pressure was stable and patient was reluctant to pursue ablation or antiarrhythmic medication at that time.  We also discussed home sleep study to rule out sleep apnea however patient declined pursuing testing at that time.  During visit we increased his metoprolol to 50 mg twice daily and patient was advised to continue fluid restrictions and liberation of salt due to hyponatremia.   During today's visit the patient reports*** .  Patient denies chest pain, palpitations, dyspnea, PND, orthopnea, nausea, vomiting, dizziness, syncope, edema, weight gain, or early satiety.  ***Notes: -Last ischemic evaluation: -Last echo: -Interim ED visits: Review of Systems  Please see the history of present illness.    All other systems reviewed and are otherwise negative except as noted above.  Physical Exam    Wt Readings from Last 3 Encounters:  07/22/23 231 lb (104.8 kg)  04/22/23 230 lb (104.3 kg)  04/15/23 228 lb (103.4 kg)   UJ:WJXBJ were no vitals filed for this visit.,There is no height or weight on file to calculate BMI. GEN: Well nourished, well developed in no acute distress Neck: No JVD; No carotid bruits Pulmonary: Clear to auscultation without rales, wheezing or rhonchi  Cardiovascular: Normal rate. Regular rhythm. Normal S1. Normal S2.    Murmurs: There is no murmur.  ABDOMEN: Soft, non-tender, non-distended EXTREMITIES:  No edema; No deformity   EKG/LABS/ Recent Cardiac Studies   ECG personally reviewed by me today - ***  Risk Assessment/Calculations:   {Does this patient have ATRIAL FIBRILLATION?:[phone]}      Lab Results  Component Value Date   WBC 11.2 (H) 07/22/2023   HGB 16.4 07/22/2023   HCT 49.9 07/22/2023   MCV 98.3 07/22/2023   PLT 207.0 07/22/2023   Lab Results  Component Value Date   CREATININE 1.12 08/13/2023   BUN 18 08/13/2023   NA 140 08/13/2023   K 3.9 08/13/2023   CL 105 08/13/2023   CO2 20 08/13/2023   Lab Results  Component Value Date   CHOL 112 07/22/2023   HDL 55.90 07/22/2023   LDLCALC 33 07/22/2023   TRIG 115.0 07/22/2023   CHOLHDL 2 07/22/2023    Lab Results  Component Value Date   HGBA1C 7.0 (H) 07/22/2023   Assessment & Plan    1.  Persistent atrial fibrillation:   2.  Coronary artery calcifications:  3.Aortic atherosclerosis: -Continue GDMT with Crestor 20 mg -Patient currently not on ASA 81 mg due to Eliquis.  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  5.. SIADH/Hyponatremia: -Patient's sodium was      Disposition: Follow-up with Meriam Sprague, MD (Inactive) or APP in *** months {Are you ordering a CV Procedure (e.g. stress test, cath, DCCV, TEE, etc)?   Press F2        :[id number]}  Signed, Napoleon Form, Leodis Rains, NP 08/18/2023, 2:11 PM [facility]

## 2023-08-19 ENCOUNTER — Ambulatory Visit: Payer: Medicare HMO | Attending: Nurse Practitioner | Admitting: Nurse Practitioner

## 2023-08-19 ENCOUNTER — Encounter: Payer: Self-pay | Admitting: Nurse Practitioner

## 2023-08-19 ENCOUNTER — Ambulatory Visit (INDEPENDENT_AMBULATORY_CARE_PROVIDER_SITE_OTHER)

## 2023-08-19 ENCOUNTER — Telehealth: Payer: Self-pay | Admitting: *Deleted

## 2023-08-19 VITALS — BP 136/86 | HR 87 | Ht 70.0 in | Wt 231.8 lb

## 2023-08-19 DIAGNOSIS — I700 Atherosclerosis of aorta: Secondary | ICD-10-CM | POA: Diagnosis not present

## 2023-08-19 DIAGNOSIS — I4819 Other persistent atrial fibrillation: Secondary | ICD-10-CM

## 2023-08-19 DIAGNOSIS — E785 Hyperlipidemia, unspecified: Secondary | ICD-10-CM

## 2023-08-19 DIAGNOSIS — I2510 Atherosclerotic heart disease of native coronary artery without angina pectoris: Secondary | ICD-10-CM | POA: Diagnosis not present

## 2023-08-19 DIAGNOSIS — E222 Syndrome of inappropriate secretion of antidiuretic hormone: Secondary | ICD-10-CM

## 2023-08-19 NOTE — Telephone Encounter (Signed)
   Cardiac Monitor Alert  Date of alert:  08/19/2023   Patient Name: Jermaine Soto  DOB: 24-Aug-1945  MRN: [id number]   [facility] Cardiologist: [facility] EP:  None    Monitor Information: Cardiac Event Monitor [[employer]]  Reason:  atrial fib Ordering provider:  Robin Searing np   Alert Atrial Fibrillation/Flutter This is the 1st alert for this rhythm.  The patient has a hx of Atrial Fibrillation/Flutter.  The patient is not currently on anticoagulation.  Anticoagulation medication as of 08/19/2023           apixaban (ELIQUIS) 5 MG TABS tablet Take 1 tablet (5 mg total) by mouth 2 (two) times daily.       Next Cardiology Appointment   Date:  none Provider:  none  The patient was contacted today.  He is asymptomatic. Patient was seen today in clinic. Monitor was ordered to see how often the patient is in a fib.      Deliah Goody, RN  08/19/2023 12:57 PM

## 2023-08-19 NOTE — Progress Notes (Unsigned)
 Applied a 30 day [employer] to patient  Jermaine Soto to read

## 2023-08-19 NOTE — Patient Instructions (Addendum)
 Medication Instructions:  STOP Metoprolol 25mg   *If you need a refill on your cardiac medications before your next appointment, please call your pharmacy*   Lab Work: None ordered If you have labs (blood work) drawn today and your tests are completely normal, you will receive your results only by: MyChart Message (if you have MyChart) OR A paper copy in the mail If you have any lab test that is abnormal or we need to change your treatment, we will call you to review the results.   Testing/Procedures: Your physician has requested that you have an echocardiogram. Echocardiography is a painless test that uses sound waves to create images of your heart. It provides your doctor with information about the size and shape of your heart and how well your heart's chambers and valves are working. This procedure takes approximately one hour. There are no restrictions for this procedure. Please do NOT wear cologne, perfume, aftershave, or lotions (deodorant is allowed). Please arrive 15 minutes prior to your appointment time.  Please note: We ask at that you not bring children with you during ultrasound (echo/ vascular) testing. Due to room size and safety concerns, children are not allowed in the ultrasound rooms during exams. Our front office staff cannot provide observation of children in our lobby area while testing is being conducted. An adult accompanying a patient to their appointment will only be allowed in the ultrasound room at the discretion of the ultrasound technician under special circumstances. We apologize for any inconvenience.   Your physician has recommended that you wear an 30 day event monitor. Event monitors are medical devices that record the heart's electrical activity. Doctors most often [place] these monitors to diagnose arrhythmias. Arrhythmias are problems with the speed or rhythm of the heartbeat. The monitor is a small, portable device. You can wear one while you do your normal daily  activities. This is usually used to diagnose what is causing palpitations/syncope (passing out).   Follow-Up: At [facility], you and your health needs are our priority.  As part of our continuing mission to provide you with exceptional heart care, we have created designated Provider Care Teams.  These Care Teams include your primary Cardiologist (physician) and Advanced Practice Providers (APPs -  Physician Assistants and Nurse Practitioners) who all work together to provide you with the care you need, when you need it.  We recommend signing up for the patient portal called "MyChart".  Sign up information is provided on this After Visit Summary.  MyChart is used to connect with patients for Virtual Visits (Telemedicine).  Patients are able to view lab/test results, encounter notes, upcoming appointments, etc.  Non-urgent messages can be sent to your provider as well.   To learn more about what you can do with MyChart, go to [email or web address].    Your next appointment:   6 month(s)  Provider:   Janice Norrie     Other Instructions     Heart-Healthy Eating Plan Eating a healthy diet is important for the health of your heart. A heart-healthy eating plan includes: Eating less unhealthy fats. Eating more healthy fats. Eating less salt in your food. Salt is also called sodium. Making other changes in your diet. Talk with your doctor or a diet specialist (dietitian) to create an eating plan that is right for you. What is my plan? Your doctor may recommend an eating plan that includes: Total fat: ______% or less of total calories a day. Saturated fat: ______% or less of total calories  a day. Cholesterol: less than _________mg a day. Sodium: less than _________mg a day. What are tips for following this plan? Cooking Avoid frying your food. Try to bake, boil, grill, or broil it instead. You can also reduce fat by: Removing the skin from poultry. Removing all visible fats  from meats. Steaming vegetables in water or broth. Meal planning  At meals, divide your plate into four equal parts: Fill one-half of your plate with vegetables and green salads. Fill one-fourth of your plate with whole grains. Fill one-fourth of your plate with lean protein foods. Eat 2-4 cups of vegetables per day. One cup of vegetables is: 1 cup (91 g) broccoli or cauliflower florets. 2 medium carrots. 1 large bell pepper. 1 large sweet potato. 1 large tomato. 1 medium white potato. 2 cups (150 g) raw leafy greens. Eat 1-2 cups of fruit per day. One cup of fruit is: 1 small apple 1 large banana 1 cup (237 g) mixed fruit, 1 large orange,  cup (82 g) dried fruit, 1 cup (240 mL) 100% fruit juice. Eat more foods that have soluble fiber. These are apples, broccoli, carrots, beans, peas, and barley. Try to get 20-30 g of fiber per day. Eat 4-5 servings of nuts, legumes, and seeds per week: 1 serving of dried beans or legumes equals  cup (90 g) cooked. 1 serving of nuts is  oz (12 almonds, 24 pistachios, or 7 walnut halves). 1 serving of seeds equals  oz (8 g). General information Eat more home-cooked food. Eat less restaurant, buffet, and fast food. Limit or avoid alcohol. Limit foods that are high in starch and sugar. Avoid fried foods. Lose weight if you are overweight. Keep track of how much salt (sodium) you eat. This is important if you have high blood pressure. Ask your doctor to tell you more about this. Try to add vegetarian meals each week. Fats Choose healthy fats. These include olive oil and canola oil, flaxseeds, walnuts, almonds, and seeds. Eat more omega-3 fats. These include salmon, mackerel, sardines, tuna, flaxseed oil, and ground flaxseeds. Try to eat fish at least 2 times each week. Check food labels. Avoid foods with trans fats or high amounts of saturated fat. Limit saturated fats. These are often found in animal products, such as meats, butter, and  cream. These are also found in plant foods, such as palm oil, palm kernel oil, and coconut oil. Avoid foods with partially hydrogenated oils in them. These have trans fats. Examples are stick margarine, some tub margarines, cookies, crackers, and other baked goods. What foods should I eat? Fruits All fresh, canned (in natural juice), or frozen fruits. Vegetables Fresh or frozen vegetables (raw, steamed, roasted, or grilled). Green salads. Grains Most grains. Choose whole wheat and whole grains most of the time. Rice and pasta, including brown rice and pastas made with whole wheat. Meats and other proteins Lean, well-trimmed beef, veal, pork, and lamb. Chicken and [place] without skin. All fish and shellfish. Wild duck, rabbit, pheasant, and venison. Egg whites or low-cholesterol egg substitutes. Dried beans, peas, lentils, and tofu. Seeds and most nuts. Dairy Low-fat or nonfat cheeses, including ricotta and mozzarella. Skim or 1% milk that is liquid, powdered, or evaporated. Buttermilk that is made with low-fat milk. Nonfat or low-fat yogurt. Fats and oils Non-hydrogenated (trans-free) margarines. Vegetable oils, including soybean, sesame, sunflower, olive, peanut, safflower, corn, canola, and cottonseed. Salad dressings or mayonnaise made with a vegetable oil. Beverages Mineral water. Coffee and tea. Diet carbonated beverages. Sweets  and desserts Sherbet, gelatin, and fruit ice. Small amounts of dark chocolate. Limit all sweets and desserts. Seasonings and condiments All seasonings and condiments. The items listed above may not be a complete list of foods and drinks you can eat. Contact a dietitian for more options. What foods should I avoid? Fruits Canned fruit in heavy syrup. Fruit in cream or butter sauce. Fried fruit. Limit coconut. Vegetables Vegetables cooked in cheese, cream, or butter sauce. Fried vegetables. Grains Breads that are made with saturated or trans fats, oils, or  whole milk. Croissants. Sweet rolls. Donuts. High-fat crackers, such as cheese crackers. Meats and other proteins Fatty meats, such as hot dogs, ribs, sausage, bacon, rib-eye roast or steak. High-fat deli meats, such as salami and bologna. Caviar. Domestic duck and goose. Organ meats, such as liver. Dairy Cream, sour cream, cream cheese, and creamed cottage cheese. Whole-milk cheeses. Whole or 2% milk that is liquid, evaporated, or condensed. Whole buttermilk. Cream sauce or high-fat cheese sauce. Yogurt that is made from whole milk. Fats and oils Meat fat, or shortening. Cocoa butter, hydrogenated oils, palm oil, coconut oil, palm kernel oil. Solid fats and shortenings, including bacon fat, salt pork, lard, and butter. Nondairy cream substitutes. Salad dressings with cheese or sour cream. Beverages Regular sodas and juice drinks with added sugar. Sweets and desserts Frosting. Pudding. Cookies. Cakes. Pies. Milk chocolate or white chocolate. Buttered syrups. Full-fat ice cream or ice cream drinks. The items listed above may not be a complete list of foods and drinks to avoid. Contact a dietitian for more information. Summary Heart-healthy meal planning includes eating less unhealthy fats, eating more healthy fats, and making other changes in your diet. Eat a balanced diet. This includes fruits and vegetables, low-fat or nonfat dairy, lean protein, nuts and legumes, whole grains, and heart-healthy oils and fats. This information is not intended to replace advice given to you by your health care provider. Make sure you discuss any questions you have with your health care provider. Document Revised: 07/03/2021 Document Reviewed: 07/03/2021 Elsevier Patient Education  2024 [employer].

## 2023-08-20 ENCOUNTER — Encounter: Payer: Self-pay | Admitting: "Endocrinology

## 2023-08-20 ENCOUNTER — Ambulatory Visit: Payer: Medicare HMO | Admitting: "Endocrinology

## 2023-08-20 VITALS — BP 122/82 | HR 52 | Ht 70.0 in | Wt 232.2 lb

## 2023-08-20 DIAGNOSIS — E230 Hypopituitarism: Secondary | ICD-10-CM

## 2023-08-20 DIAGNOSIS — E038 Other specified hypothyroidism: Secondary | ICD-10-CM | POA: Diagnosis not present

## 2023-08-20 DIAGNOSIS — E041 Nontoxic single thyroid nodule: Secondary | ICD-10-CM

## 2023-08-20 DIAGNOSIS — E236 Other disorders of pituitary gland: Secondary | ICD-10-CM | POA: Diagnosis not present

## 2023-08-20 NOTE — Progress Notes (Signed)
 Patient ID: Jermaine Soto, male   DOB: Mar 12, 1946, 78 y.o.   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  Had low TSH with low Free T4 The patient has been treated with synthroid since 2016, for hypothyroidism. Prior to endocrinology consultation he was being treated by PCP with low doses of levothyroxine between 50 and 75 mcg This was subsequently increased when the diagnosis of SECONDARY hypothyroidism was made during his hospitalization in 06/2020.  He is currently on stable dose of 100 mcg of levothyroxine. Takes appropriately.  He has a thyroid nodule as well. Conclusion of ultrasound on 07/17/2021 showed:  Nodule 3 (TI-RADS 4), now measures 1.4 x 1.4 x 1.1 cm compared to 1.2 x 1.0 x 0.9 cm on the prior exam. It is located in the inferior right thyroid lobe and meets criteria for imaging follow-up. Annual ultrasound surveillance is recommended until 5 years of stability is documented.  12/2022 thyroid U/S showed: Heterogeneous appearance of the thyroid gland. A single nodule in the mid right thyroid lobe identified on today's exam (1.0 cm TR 4) meets criteria for follow-up ultrasound in 1 year. This exam marks approximately 2.5 year stability dating back to February 2022. A total follow-up interval of 5 years is recommended.  Reports feeling pretty good No dysphagia/dysphonia/dyspnea Weight is stable  Wt Readings from Last 3 Encounters:  08/20/23 232 lb 3.2 oz (105.3 kg)  08/19/23 231 lb 12.8 oz (105.1 kg)  07/22/23 231 lb (104.8 kg)    Lab Results  Component Value Date   TSH 0.02 (L) 08/13/2023   TSH 0.04 (L) 08/06/2022   TSH 0.04 (L) 11/09/2021   FREET4 1.6 08/13/2023   FREET4 1.15 04/15/2023   FREET4 1.06 12/18/2022   ADRENAL  insufficiency:  In January 2022 he was admitted with symptoms of a stroke and sodium of 121. Although he was not showing any hypotension he had significantly low cortisol levels. Subsequently has been taking only 5 mg prednisone daily. He does not complain of any nausea, lightheadedness or decreased appetite. Sodium has been consistently normal.  Wt Readings from Last 3 Encounters:  08/20/23 232 lb 3.2 oz (105.3 kg)  08/19/23 231 lb 12.8 oz (105.1 kg)  07/22/23 231 lb (104.8 kg)    TESTOSTERONE deficiency: Although he has had 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    He was recommended testosterone supplementations because of his very low testosterone level as of June 2023. He started using Axiron in June 2023 and prefers taking 1 pump under each arm. Feels good on testosterone supplements, previously reported: been able to exercise more regularly and he thinks he is building up more muscle in his upper body. Not sexually active but has good libido.  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 This was measuring 1.4 cm with some suprasellar extension. However, follow-up on 08/31/2021 showed the cyst to be about 3 to 4 mm only (full current dimension 4 mm*7*74mm). Denies head ache/vision changes   Patient explained the use of testosterone deficiency Patient reports he is already taking so many medications   Allergies as of  08/20/2023       Reactions   Sulfa Antibiotics    Unknown   Tape Itching        Medication List        Accurate as of August 20, 2023  9:03 AM. If you have any questions, ask your nurse or doctor.          apixaban 5 MG Tabs tablet Commonly known as: Eliquis Take 1 tablet (5 mg total) by mouth 2 (two) times daily.   cholecalciferol 25 MCG (1000 UNIT)  tablet Commonly known as: VITAMIN D3 Take 1,000 Units by mouth daily.   levothyroxine 100 MCG tablet Commonly known as: SYNTHROID TAKE 1 TABLET BY MOUTH DAILY   metoprolol tartrate 50 MG tablet Commonly known as: LOPRESSOR Take 1 tablet (50 mg total) by mouth 2 (two) times daily.   predniSONE 5 MG tablet Commonly known as: DELTASONE Take 1 tablet (5 mg total) by mouth daily with breakfast.   PRESERVISION AREDS 2 PO Take 1 capsule by mouth in the morning and at bedtime.   rosuvastatin 20 MG tablet Commonly known as: CRESTOR Take 1 tablet (20 mg total) by mouth daily.   Testosterone 30 MG/ACT Soln Dispense liquid into application cup, apply in the armpits, once on each side        Allergies:  Allergies  Allergen Reactions   Sulfa Antibiotics     Unknown   Tape Itching    Past Medical History:  Diagnosis Date   Arthritis    hands    Hypothyroidism     Past Surgical History:  Procedure Laterality Date   CARDIOVERSION N/A 11/13/2022   Procedure: CARDIOVERSION;  Surgeon: [name], MD;  Location: [facility] INVASIVE CV LAB;  Service: Cardiovascular;  Laterality: N/A;   IR ANGIO INTRA EXTRACRAN SEL COM CAROTID INNOMINATE BILAT MOD SED  06/15/2020   IR ANGIO VERTEBRAL SEL VERTEBRAL BILAT MOD SED  06/15/2020   RADIOLOGY WITH ANESTHESIA N/A 06/15/2020   Procedure: IR WITH ANESTHESIA;  Surgeon: Radiologist, Medication, MD;  Location: [facility] OR;  Service: Radiology;  Laterality: N/A;   ROBOT ASSISTED LAPAROSCOPIC RADICAL PROSTATECTOMY N/A 12/24/2012   Procedure: ROBOTIC ASSISTED LAPAROSCOPIC RADICAL PROSTATECTOMY;  Surgeon: Valetta Fuller, MD;  Location: [facility] ORS;  Service: Urology;  Laterality: N/A;    Family History  Problem Relation Age of Onset   Thyroid disease Neg Hx     Social History:  reports that he has never smoked. He has never used smokeless tobacco. He reports current alcohol use. He reports that he does not use drugs.  Review of Systems Same as above    Examination:   BP 122/82   Pulse (!) 52   Ht 5\' 10"  (1.778 m)   Wt 232 lb 3.2 oz (105.3 kg)   SpO2 95%   BMI 33.32 kg/m   Physical Exam Constitutional:      Appearance: Normal appearance. Not ill-appearing.  HENT:     Head: Normocephalic and atraumatic.  Eyes:     General: No scleral icterus.    Conjunctiva/sclera: Conjunctivae normal.  Pulmonary:     Effort: Pulmonary effort is normal. No respiratory distress.  Musculoskeletal:     Cervical back: Normal range of motion. No rigidity.  Skin:    Coloration: Skin is not jaundiced.     Findings: No rash.  Neurological:     Mental Status: Alert and oriented to person, place, and time. Psychiatric:        Behavior: Behavior normal.  Judgment: Judgment normal.    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 Free T4 is in a good range and stable with 100 mcg supplementation  ADRENAL insufficiency, partial: Subjectively the doing well with only 5 mg of prednisone in the morning without any weakness, nausea or low sodium.  HYPOGONADISM: He has history of significant hypogonadism. Although his testosterone level is low normal he is not apply the medication the day he came in and also may not be using the proper technique to apply the Axiron solution.  Treatment:  08/20/23 Discussed application technique for Testosterone. On Testosterone 30 MG/ACT SOLN. Takes 2 pumps/cups, one on each shoulder. Cut it down to 1.5 cups/pumps.  Stress  doses of up to 10 mg prednisone if he has any acute illness/surgery for the duration of illness. Continue taking levothyroxine before breakfast unchanged. Do labs after 3-4 hours after testosterone application for peak reading Follow up US thyroid ordered per TR4 nodule follow up recommendation: 1, 2, 3 and 5 years Patient doesn't want growth hormone replacement    Patient instruction: You cannot make your own cortisol (stress hormone).  It is very important that you prednisone every day at the times we describe.  It can be very dangerous if you miss a dose. If you get sick with nausea and vomiting and cannot keep the medications down, you should come to the ER for IV steroids.  If you get sick with a cold or flu, double your dose for three days, then drop back down to your regular dose. Consider updating diagnosis of "adrenal insufficiency" in your phone emergency history and wear medical bracelet stating the same.   Return in about 4 months (around 12/20/2023) for visit + labs before next visit.   Jermaine Soto 08/20/2023, 9:03 AM   Note: This office note was prepared with Insurance underwriter. Any transcriptional errors that result from this process are unintentional.

## 2023-08-20 NOTE — Patient Instructions (Addendum)
 Patient instruction: You cannot make your own cortisol (stress hormone).  It is very important that you prednisone every day at the times we describe.  It can be very dangerous if you miss a dose. If you get sick with nausea and vomiting and cannot keep the medications down, you should come to the ER for IV steroids.  If you get sick with a cold or flu, double your dose for three days, then drop back down to your regular dose. Consider updating diagnosis of "adrenal insufficiency" in your phone emergency history and wear medical bracelet stating the same.  Do labs after 3-4 hours after testosterone application

## 2023-08-23 ENCOUNTER — Other Ambulatory Visit: Payer: Self-pay | Admitting: Nurse Practitioner

## 2023-08-26 ENCOUNTER — Ambulatory Visit: Payer: Medicare HMO | Admitting: Internal Medicine

## 2023-09-11 ENCOUNTER — Ambulatory Visit (HOSPITAL_COMMUNITY)

## 2023-09-11 DIAGNOSIS — E222 Syndrome of inappropriate secretion of antidiuretic hormone: Secondary | ICD-10-CM | POA: Diagnosis not present

## 2023-09-11 DIAGNOSIS — H35372 Puckering of macula, left eye: Secondary | ICD-10-CM | POA: Diagnosis not present

## 2023-09-11 DIAGNOSIS — I4819 Other persistent atrial fibrillation: Secondary | ICD-10-CM | POA: Diagnosis not present

## 2023-09-11 DIAGNOSIS — H02836 Dermatochalasis of left eye, unspecified eyelid: Secondary | ICD-10-CM | POA: Diagnosis not present

## 2023-09-11 DIAGNOSIS — I2510 Atherosclerotic heart disease of native coronary artery without angina pectoris: Secondary | ICD-10-CM | POA: Diagnosis not present

## 2023-09-11 DIAGNOSIS — E785 Hyperlipidemia, unspecified: Secondary | ICD-10-CM | POA: Diagnosis not present

## 2023-09-11 DIAGNOSIS — I700 Atherosclerosis of aorta: Secondary | ICD-10-CM | POA: Diagnosis not present

## 2023-09-11 DIAGNOSIS — H02833 Dermatochalasis of right eye, unspecified eyelid: Secondary | ICD-10-CM | POA: Diagnosis not present

## 2023-09-11 DIAGNOSIS — H353132 Nonexudative age-related macular degeneration, bilateral, intermediate dry stage: Secondary | ICD-10-CM | POA: Diagnosis not present

## 2023-09-11 DIAGNOSIS — H35352 Cystoid macular degeneration, left eye: Secondary | ICD-10-CM | POA: Diagnosis not present

## 2023-09-11 LAB — ECHOCARDIOGRAM COMPLETE: S' Lateral: 3.3 cm

## 2023-10-16 DIAGNOSIS — H35352 Cystoid macular degeneration, left eye: Secondary | ICD-10-CM | POA: Diagnosis not present

## 2023-10-16 DIAGNOSIS — H353132 Nonexudative age-related macular degeneration, bilateral, intermediate dry stage: Secondary | ICD-10-CM | POA: Diagnosis not present

## 2023-10-16 DIAGNOSIS — H35372 Puckering of macula, left eye: Secondary | ICD-10-CM | POA: Diagnosis not present

## 2023-10-16 DIAGNOSIS — H02836 Dermatochalasis of left eye, unspecified eyelid: Secondary | ICD-10-CM | POA: Diagnosis not present

## 2023-10-16 DIAGNOSIS — H02833 Dermatochalasis of right eye, unspecified eyelid: Secondary | ICD-10-CM | POA: Diagnosis not present

## 2023-10-31 ENCOUNTER — Other Ambulatory Visit: Payer: Self-pay

## 2023-10-31 MED ORDER — ROSUVASTATIN CALCIUM 20 MG PO TABS: 20.0000 mg | ORAL_TABLET | Freq: Every day | ORAL | 3 refills | Status: AC

## 2023-11-07 DIAGNOSIS — I4819 Other persistent atrial fibrillation: Secondary | ICD-10-CM

## 2023-11-08 ENCOUNTER — Ambulatory Visit: Payer: Self-pay | Admitting: Nurse Practitioner

## 2023-11-19 DIAGNOSIS — D225 Melanocytic nevi of trunk: Secondary | ICD-10-CM | POA: Diagnosis not present

## 2023-11-19 DIAGNOSIS — S1086XA Insect bite of other specified part of neck, initial encounter: Secondary | ICD-10-CM | POA: Diagnosis not present

## 2023-11-19 DIAGNOSIS — Z85828 Personal history of other malignant neoplasm of skin: Secondary | ICD-10-CM | POA: Diagnosis not present

## 2023-11-19 DIAGNOSIS — L814 Other melanin hyperpigmentation: Secondary | ICD-10-CM | POA: Diagnosis not present

## 2023-11-19 DIAGNOSIS — Z08 Encounter for follow-up examination after completed treatment for malignant neoplasm: Secondary | ICD-10-CM | POA: Diagnosis not present

## 2023-11-19 DIAGNOSIS — L821 Other seborrheic keratosis: Secondary | ICD-10-CM | POA: Diagnosis not present

## 2023-11-29 ENCOUNTER — Other Ambulatory Visit: Payer: Self-pay | Admitting: Nurse Practitioner

## 2023-11-29 DIAGNOSIS — I4891 Unspecified atrial fibrillation: Secondary | ICD-10-CM

## 2023-11-29 NOTE — Telephone Encounter (Signed)
 Prescription refill request for Eliquis  received. Indication:afib Last office visit3/25 Scr:1.12  3/25 Age: 78 Weight:105.3  kg  Prescription refilled

## 2023-12-03 DIAGNOSIS — H02836 Dermatochalasis of left eye, unspecified eyelid: Secondary | ICD-10-CM | POA: Diagnosis not present

## 2023-12-03 DIAGNOSIS — H35372 Puckering of macula, left eye: Secondary | ICD-10-CM | POA: Diagnosis not present

## 2023-12-03 DIAGNOSIS — H35352 Cystoid macular degeneration, left eye: Secondary | ICD-10-CM | POA: Diagnosis not present

## 2023-12-03 DIAGNOSIS — H02833 Dermatochalasis of right eye, unspecified eyelid: Secondary | ICD-10-CM | POA: Diagnosis not present

## 2023-12-03 DIAGNOSIS — H353132 Nonexudative age-related macular degeneration, bilateral, intermediate dry stage: Secondary | ICD-10-CM | POA: Diagnosis not present

## 2023-12-04 ENCOUNTER — Encounter: Payer: Self-pay | Admitting: Internal Medicine

## 2023-12-09 ENCOUNTER — Other Ambulatory Visit: Payer: Self-pay | Admitting: "Endocrinology

## 2023-12-09 DIAGNOSIS — E230 Hypopituitarism: Secondary | ICD-10-CM

## 2023-12-10 NOTE — Telephone Encounter (Signed)
 Requested Prescriptions     Pending Prescriptions Disp Refills   . levothyroxine (SYNTHROID) 100 MCG tablet [Pharmacy Med Name: LEVOTHYROXINE 100 MCG TABLET] 90 tablet 1     Sig: TAKE 1 TABLET BY MOUTH DAILY

## 2024-01-08 ENCOUNTER — Other Ambulatory Visit

## 2024-01-08 DIAGNOSIS — E038 Other specified hypothyroidism: Secondary | ICD-10-CM | POA: Diagnosis not present

## 2024-01-08 DIAGNOSIS — E041 Nontoxic single thyroid nodule: Secondary | ICD-10-CM | POA: Diagnosis not present

## 2024-01-08 DIAGNOSIS — E230 Hypopituitarism: Secondary | ICD-10-CM | POA: Diagnosis not present

## 2024-01-09 LAB — TESTOSTERONE TOTAL,FREE,BIO, MALES
Sex Hormone Binding: 59 nmol/L (ref 22–77)
Sex Hormone Binding: 59 nmol/L (ref 3.6–77)
Testosterone, Bioavailable: 118.3 ng/dL (ref 15.0–150.0)
Testosterone, Free: 60.1 pg/mL (ref 6.0–73.0)
Testosterone: 705 ng/dL (ref 250–827)

## 2024-01-09 LAB — BASIC METABOLIC PANEL WITHOUT GFR
BUN: 21 mg/dL (ref 7–25)
CO2: 28 mmol/L (ref 20–32)
Calcium: 9.3 mg/dL (ref 8.6–10.3)
Chloride: 105 mmol/L (ref 98–110)
Creat: 1.06 mg/dL (ref 0.70–1.28)
Glucose, Bld: 97 mg/dL (ref 65–99)
Potassium: 4 mmol/L (ref 3.5–5.3)
Sodium: 140 mmol/L (ref 135–146)

## 2024-01-09 LAB — T4, FREE: Free T4: 1.6 ng/dL (ref 0.8–1.8)

## 2024-01-13 ENCOUNTER — Encounter: Payer: Self-pay | Admitting: "Endocrinology

## 2024-01-13 ENCOUNTER — Ambulatory Visit: Admitting: "Endocrinology

## 2024-01-13 VITALS — BP 106/80 | HR 101 | Ht 70.0 in | Wt 234.0 lb

## 2024-01-13 DIAGNOSIS — E230 Hypopituitarism: Secondary | ICD-10-CM | POA: Diagnosis not present

## 2024-01-13 DIAGNOSIS — E038 Other specified hypothyroidism: Secondary | ICD-10-CM

## 2024-01-13 DIAGNOSIS — E2749 Other adrenocortical insufficiency: Secondary | ICD-10-CM | POA: Diagnosis not present

## 2024-01-13 DIAGNOSIS — E041 Nontoxic single thyroid nodule: Secondary | ICD-10-CM | POA: Diagnosis not present

## 2024-01-13 NOTE — Progress Notes (Signed)
 Patient ID: Jermaine Soto, male   DOB: April 03, 1946, 77 y.o.   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  Had low TSH with low Free T4 The patient has been treated with synthroid  since 2016, for hypothyroidism. Prior to endocrinology consultation he was being treated by PCP with low doses of levothyroxine  between 50 and 75 mcg This was subsequently increased when the diagnosis of SECONDARY hypothyroidism was made during his hospitalization in 06/2020.  He is currently on stable dose of 100 mcg of levothyroxine . Takes appropriately.  He has a thyroid  nodule as well. Conclusion of ultrasound on 07/17/2021 showed:  Nodule 3 (TI-RADS 4), now measures 1.4 x 1.4 x 1.1 cm compared to 1.2 x 1.0 x 0.9 cm on the prior exam. It is located in the inferior right thyroid  lobe and meets criteria for imaging follow-up. Annual ultrasound surveillance is recommended until 5 years of stability is documented.  12/2022 thyroid  U/S showed: Heterogeneous appearance of the thyroid  gland. A single nodule in the mid right thyroid  lobe identified on today's exam (1.0 cm TR 4) meets criteria for follow-up ultrasound in 1 year. This exam marks approximately 2.5 year stability dating back to February 2022. A total follow-up interval of 5 years is recommended.  Reports poor energy, not sexually active sine 10 yrs  No dysphagia/dysphonia/dyspnea Weight is stable  Wt Readings from Last 3 Encounters:  01/13/24 234 lb (106.1 kg)  08/20/23 232 lb 3.2 oz (105.3 kg)  08/19/23 231 lb 12.8 oz (105.1 kg)    Lab Results  Component Value Date   TSH 0.02 (L) 08/13/2023   TSH 0.04 (L) 08/06/2022   TSH 0.04 (L) 11/09/2021   FREET4 1.6 01/08/2024   FREET4 1.6 08/13/2023   FREET4 1.15 04/15/2023    ADRENAL insufficiency:  In January 2022 he was admitted with symptoms of a stroke and sodium of 121. Although he was not showing any hypotension he had significantly low cortisol levels. Subsequently has been taking only 5 mg prednisone  daily. He does not complain of any nausea, lightheadedness or decreased appetite. Sodium has been consistently normal.  Wt Readings from Last 3 Encounters:  01/13/24 234 lb (106.1 kg)  08/20/23 232 lb 3.2 oz (105.3 kg)  08/19/23 231 lb 12.8 oz (105.1 kg)    TESTOSTERONE  deficiency: Although he has had 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    He was recommended testosterone  supplementations because of his very low testosterone  level as of June 2023. He started using Axiron  in June 2023 and prefers taking 1 pump under each arm. Feels good on testosterone  supplements, previously reported: been able to exercise more regularly and he thinks he is building up more muscle in his upper body. Not sexually active but has good libido.  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 This was measuring 1.4 cm with some suprasellar extension. However, follow-up on 08/31/2021 showed the cyst to be about 3 to 4 mm only (full current dimension 4 mm*7*69mm). Denies head ache/vision changes   Patient explained the use of testosterone  deficiency Patient reports he is already taking so many  medications   Allergies as of 01/13/2024       Reactions   Sulfa Antibiotics    Unknown   Tape Itching        Medication List        Accurate as of January 13, 2024 10:05 AM. If you have any questions, ask your nurse or doctor.          cholecalciferol 25 MCG (1000 UNIT) tablet Commonly known as: VITAMIN D3 Take 1,000 Units by mouth daily.   Eliquis  5 MG Tabs tablet Generic  drug: apixaban  TAKE 1 TABLET BY MOUTH 2 TIMES A DAY   ketorolac 0.5 % ophthalmic solution Commonly known as: ACULAR SMARTSIG:In Eye(s)   levothyroxine  100 MCG tablet Commonly known as: SYNTHROID  TAKE 1 TABLET BY MOUTH DAILY   metoprolol  tartrate 50 MG tablet Commonly known as: LOPRESSOR  TAKE 1 TABLET BY MOUTH 2 TIMES A DAY   predniSONE  5 MG tablet Commonly known as: DELTASONE  Take 1 tablet (5 mg total) by mouth daily with breakfast.   PRESERVISION AREDS 2 PO Take 1 capsule by mouth in the morning and at bedtime.   rosuvastatin  20 MG tablet Commonly known as: CRESTOR  Take 1 tablet (20 mg total) by mouth daily.   Testosterone  30 MG/ACT Soln Dispense liquid into application cup, apply in the armpits, once on each side        Allergies:  Allergies  Allergen Reactions   Sulfa Antibiotics     Unknown   Tape Itching    Past Medical History:  Diagnosis Date   Arthritis    hands    Hypothyroidism     Past Surgical History:  Procedure Laterality Date   CARDIOVERSION N/A 11/13/2022   Procedure: CARDIOVERSION;  Surgeon: Kate Lonni CROME, MD;  Location: [facility] INVASIVE CV LAB;  Service: Cardiovascular;  Laterality: N/A;   IR ANGIO INTRA EXTRACRAN SEL COM CAROTID INNOMINATE BILAT MOD SED  06/15/2020   IR ANGIO VERTEBRAL SEL VERTEBRAL BILAT MOD SED  06/15/2020   RADIOLOGY WITH ANESTHESIA N/A 06/15/2020   Procedure: IR WITH ANESTHESIA;  Surgeon: Radiologist, Medication, MD;  Location: [facility] OR;  Service: Radiology;  Laterality: N/A;   ROBOT ASSISTED LAPAROSCOPIC RADICAL PROSTATECTOMY N/A 12/24/2012   Procedure: ROBOTIC ASSISTED LAPAROSCOPIC RADICAL PROSTATECTOMY;  Surgeon: Alm GORMAN Fragmin, MD;  Location: [facility] ORS;  Service: Urology;  Laterality: N/A;    Family History  Problem Relation Age of Onset   Thyroid  disease Neg Hx     Social History:  reports that he has never smoked. He has never used smokeless tobacco. He reports current alcohol use. He reports that he does not use  drugs.  Review of Systems Same as above   Examination:   BP 106/80   Pulse (!) 101   Ht 5' 10 (1.778 m)   Wt 234 lb (106.1 kg)   SpO2 96%   BMI 33.58 kg/m   Physical Exam Constitutional:      Appearance: Normal appearance. Not ill-appearing.  HENT:     Head: Normocephalic and atraumatic.  Eyes:     General: No scleral icterus.    Conjunctiva/sclera: Conjunctivae normal.  Pulmonary:     Effort: Pulmonary effort is normal. No respiratory distress.  Musculoskeletal:     Cervical back: Normal range of motion. No rigidity.  Skin:    Coloration: Skin is not jaundiced.     Findings: No rash.  Neurological:     Mental Status: Alert and oriented to person, place, and time. Psychiatric:  Behavior: Behavior normal.        Judgment: Judgment normal.    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 Free T4 is in a good range and stable with 100 mcg supplementation  ADRENAL insufficiency, partial: Subjectively the doing well with only 5 mg of prednisone  in the morning without any weakness, nausea or low sodium.  HYPOGONADISM: He has history of significant hypogonadism. Although his testosterone  level is low normal he is not apply the medication the day he came in and also may not be using the proper technique to apply the Axiron  solution.  Treatment: Seen images 09/01/2023 MRI HEAD WITHOUT AND WITH CONTRAST Pituitary:  Intrinsically T1 hyperintense lesion in the anterior aspect of the sella is slightly decreased in size and now measures 5 x 3 x 4 mm (see image 5, series 11 and image 9, series 12) whereas previously this measured roughly 7 x 4 x 4 mm. Hypogonadotropic hypogonadism: 08/20/23 Discussed application technique for Testosterone . On Testosterone  30 MG/ACT SOLN. Takes 2 pumps/cups, one on each shoulder. Cut it down to 1.5 cups/pumps (mark the pump for half).  Secondary adrenal insufficiency:  Continue 5 mg prednisone  qam. Stress doses of up to 10 mg prednisone  if he has any acute illness/surgery for the duration of illness. Central hypothyroidism: Continue taking levothyroxine  100 mcg before breakfast unchanged. Do labs after 3-4 hours after testosterone  application for peak reading Thyroid  nodule: Reviewed thyroid  ultrasound images and report, reported stable 1 cm thyroid  sound in 2022, 2023 in 2024.   Follow up US  thyroid  ordered per TR4 nodule follow up recommendation: 1, 2, 3 and 5 years Patient didn't want growth hormone replacement-previously discussed    Patient instruction: You cannot make your own cortisol (stress hormone).  It is very important that you prednisone  every day at the times we describe.  It can be very dangerous if you miss a dose. If you get sick with nausea and vomiting and cannot keep the medications down, you should come to the ER for IV steroids.  If you get sick with a cold or flu, double your dose for three days, then drop back down to your regular dose. Consider updating diagnosis of adrenal insufficiency in your phone emergency history and wear medical bracelet stating the same.   Return in about 6 months (around 07/15/2024) for visit + labs before next visit.   Ruffus Kamaka 01/13/2024, 10:05 AM   Note: This office note was prepared with Insurance underwriter. Any transcriptional errors that result from this process are unintentional.

## 2024-01-20 ENCOUNTER — Encounter: Payer: Self-pay | Admitting: Internal Medicine

## 2024-01-20 ENCOUNTER — Ambulatory Visit: Payer: Medicare HMO | Admitting: Internal Medicine

## 2024-01-20 VITALS — BP 122/80 | HR 89 | Temp 97.7°F | Ht 70.0 in | Wt 229.0 lb

## 2024-01-20 DIAGNOSIS — E118 Type 2 diabetes mellitus with unspecified complications: Secondary | ICD-10-CM | POA: Diagnosis not present

## 2024-01-20 LAB — POCT GLYCOSYLATED HEMOGLOBIN (HGB A1C): HbA1c, POC (controlled diabetic range): 6.7 % (ref 0.0–7.0)

## 2024-01-20 NOTE — Patient Instructions (Signed)
 Your HgA1c today is 6.7

## 2024-01-20 NOTE — Assessment & Plan Note (Signed)
 POC HgA1c done today and 6.7 which is at goal. Controlled by diet. He is on statin. Follow up 6 months. Counseled about glp-1 and he declines for now.

## 2024-01-20 NOTE — Progress Notes (Signed)
 Subjective:   Patient ID: Jermaine Soto, male    DOB: 1945-09-05, 78 y.o.   MRN: [id number]  Discussed the use of AI scribe software for clinical note transcription with the patient, who gave verbal consent to proceed.  History of Present Illness Jermaine Soto is a 78 year old male who presents for follow-up on testosterone  replacement therapy and diabetes management.  Testosterone  levels have improved significantly from undetectable to 700 following testosterone  replacement therapy. Previously, his total testosterone  was around 300. He desires more energy and notes that the therapy has helped, although he finds the application method of the testosterone  gel cumbersome.  No new chest pains, pressure, or tightness. 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 He remains active, working outside, although he acknowledges not exercising as much as he should.  His hemoglobin A1c has improved from 7.0 in February to 6.7. He mentions occasional indulgences, such as consuming a large dessert, and drinks two to three Cokes a week. He finds it difficult to lose weight despite dietary efforts.  Review of Systems  Constitutional: Negative.   HENT: Negative.    Eyes: Negative.   Respiratory:  Negative for cough, chest tightness and shortness of breath.   Cardiovascular:  Negative for chest pain, palpitations and leg swelling.  Gastrointestinal:  Negative for abdominal distention, abdominal pain, constipation, diarrhea, nausea and vomiting.  Musculoskeletal: Negative.   Skin: Negative.   Neurological: Negative.   Psychiatric/Behavioral: Negative.      Objective:  Physical Exam Constitutional:      Appearance: He is well-developed.  HENT:     Head: Normocephalic and atraumatic.  Cardiovascular:     Rate and Rhythm: Normal rate and  regular rhythm.  Pulmonary:     Effort: Pulmonary effort is normal. No respiratory distress.     Breath sounds: Normal breath sounds. No wheezing or rales.  Abdominal:     General: Bowel sounds are normal. There is no distension.     Palpations: Abdomen is soft.     Tenderness: There is no abdominal tenderness. There is no rebound.  Musculoskeletal:     Cervical back: Normal range of motion.  Skin:    General: Skin is warm and dry.  Neurological:     Mental Status: He is alert and oriented to person, place, and time.     Coordination: Coordination normal.     Vitals:   01/20/24 0849  BP: 122/80  Pulse: 89  Temp: 97.7 F (36.5 C)  TempSrc: Oral  SpO2: 98%  Weight: 229 lb (103.9 kg)  Height: 5' 10 (1.778 m)    Assessment and Plan Assessment & Plan Type 2 diabetes mellitus   Hemoglobin A1c has improved to 6.7, indicating better glycemic control. No medication is required as the condition is well-controlled. Continue current management without diabetes medication. Discuss potential use of GLP-1 agonists for weight loss if desired.  Hypogonadism on testosterone  replacement therapy   Testosterone  levels have improved to 700, within the normal range. There is difficulty in dosage adjustment due to pump administration.  Obesity   He desires to lose approximately 30 pounds. Current strategies include maintaining an active lifestyle and considering food journaling. GLP-1 agonists have been discussed for weight loss, but there is hesitance due to potential side effects. Encourage food journaling to increase awareness of dietary intake. Discuss  potential use of GLP-1 agonists for weight loss if desired.

## 2024-01-21 ENCOUNTER — Telehealth: Payer: Self-pay | Admitting: Internal Medicine

## 2024-01-21 NOTE — Telephone Encounter (Signed)
 Copied from CRM [id number]. Topic: Clinical - Medication Question >> Jan 21, 2024 12:04 PM Drema MATSU wrote: Reason for CRM: Patient stated that after long thought he has decided that he wants to start medication for weight loss. He is willing to start a GLP-1 agonists.

## 2024-01-24 MED ORDER — OZEMPIC (0.25 OR 0.5 MG/DOSE) 2 MG/3ML ~~LOC~~ SOPN: PEN_INJECTOR | SUBCUTANEOUS | 0 refills | Status: AC

## 2024-01-24 MED ORDER — SEMAGLUTIDE (2 MG/DOSE) 8 MG/3ML ~~LOC~~ SOPN
2.0000 mg | PEN_INJECTOR | SUBCUTANEOUS | 0 refills | Status: DC
Start: 2024-01-24 — End: 2024-04-27

## 2024-01-24 MED ORDER — SEMAGLUTIDE (1 MG/DOSE) 4 MG/3ML ~~LOC~~ SOPN: 1.0000 mg | PEN_INJECTOR | SUBCUTANEOUS | 0 refills | Status: DC

## 2024-01-24 NOTE — Telephone Encounter (Signed)
 Called patient and informed him of the dosage lay out and how to take it and pt verbalized he understood and has already made his 3 month f/u

## 2024-01-24 NOTE — Telephone Encounter (Signed)
 Sent in ozempic  for him. Month 1 0.25 mg weekly, month 2 0.5 mg weekly, month 3 1 mg weekly, month 4 and onward 2 mg weekly. Follow up 3 months.

## 2024-02-04 DIAGNOSIS — H35372 Puckering of macula, left eye: Secondary | ICD-10-CM | POA: Diagnosis not present

## 2024-02-04 DIAGNOSIS — H353132 Nonexudative age-related macular degeneration, bilateral, intermediate dry stage: Secondary | ICD-10-CM | POA: Diagnosis not present

## 2024-02-04 DIAGNOSIS — H35352 Cystoid macular degeneration, left eye: Secondary | ICD-10-CM | POA: Diagnosis not present

## 2024-02-04 DIAGNOSIS — H02836 Dermatochalasis of left eye, unspecified eyelid: Secondary | ICD-10-CM | POA: Diagnosis not present

## 2024-02-04 DIAGNOSIS — H02833 Dermatochalasis of right eye, unspecified eyelid: Secondary | ICD-10-CM | POA: Diagnosis not present

## 2024-02-05 ENCOUNTER — Other Ambulatory Visit: Payer: Self-pay | Admitting: "Endocrinology

## 2024-02-05 DIAGNOSIS — E230 Hypopituitarism: Secondary | ICD-10-CM

## 2024-02-12 NOTE — Progress Notes (Unsigned)
 Cardiology Office Note    Patient Name: Jermaine Soto Date of Encounter: 02/12/2024  Primary Care Provider:  Rollene Almarie LABOR, MD Primary Cardiologist:  None Primary Electrophysiologist: None   Past Medical History    Past Medical History:  Diagnosis Date   Arthritis    hands    Hypothyroidism     History of Present Illness  Jermaine Soto is a 78 year old male with a PMH of PAF (apixaban ), hyponatremia, HLD, prostate CA s/p prostatectomy, SIADH who presents today for 62-month follow-up.   Jermaine Soto was last seen on 08/19/2023 for 70-month follow-up.  He reported experiencing episodes of shortness of breath with walking and heavy lifting.  Had a tachycardia or atrial fibrillation during episodes.  His blood pressure was stable at 136/86 at that time.  He had an updated 2D echo that showed normal EF of 55 to 60% with no significant valve abnormalities and mild AI with advisement to continue Crestor .  He also wore a 30-day event monitor that showed 1% burden of AF with 47 occurrences of VT with advisement increase metoprolol  to 100 mg daily.  Jermaine Soto presents today for 65-month follow-up. He reports no episodes of rapid heart rate or chest pain, but does experience chest pressure when walking long distances or carrying heavy weights, which resolves immediately upon stopping. He is on metoprolol , currently taking 50 mg twice daily, which was increased from a previous dose to help control his heart rhythm. He might be taking 75 mg twice daily due to using leftover smaller pills. He recently started Ozempic  three weeks ago to aid in weight loss and has noticed a decrease in appetite, leading to reduced food intake. He reports mild nausea initially but no constipation. He has lost a few pounds since starting the medication. He has a family history of atrial fibrillation, with his two brothers, sister, and nephews also affected. His oldest brother, a retired Psychologist, occupational, was advised against  ablation for reasons unknown to him. Regarding his sleep, he typically goes to bed at 11 PM and wakes up at 3:30 AM to use the bathroom, after which he finds it difficult to return to sleep. He does not feel excessively fatigued during the day and can nap if needed. He does not snore as much as he used to, and his wife has not reported any concerning sleep behaviors. He recalls wearing a monitor previously, which confirmed continuous atrial fibrillation and led to an increase in his metoprolol  dosage. Patient denies chest pain, palpitations, dyspnea, PND, orthopnea, nausea, vomiting, dizziness, syncope, edema, weight gain, or early satiety.  Discussed the use of AI scribe software for clinical note transcription with the patient, who gave verbal consent to proceed.  History of Present Illness   Review of Systems  Please see the history of present illness.    All other systems reviewed and are otherwise negative except as noted above.  Physical Exam    Wt Readings from Last 3 Encounters:  01/20/24 229 lb (103.9 kg)  01/13/24 234 lb (106.1 kg)  08/20/23 232 lb 3.2 oz (105.3 kg)   CD:Uyzmz were no vitals filed for this visit.,There is no height or weight on file to calculate BMI. GEN: Well nourished, well developed in no acute distress Neck: No JVD; No carotid bruits Pulmonary: Clear to auscultation without rales, wheezing or rhonchi  Cardiovascular: Irregularly irregular Normal S1. Normal S2.   Murmurs: There is no murmur.  ABDOMEN: Soft, non-tender, non-distended EXTREMITIES:  No edema; No  deformity   EKG/LABS/ Recent Cardiac Studies   ECG personally reviewed by me today -atrial fibrillation with rate of 94 bpm PVC with no acute changes consistent with previous EKG.  Risk Assessment/Calculations:    CHA2DS2-VASc Score = 4   This indicates a 4.8% annual risk of stroke. The patient's score is based upon: CHF History: 0 HTN History: 0 Diabetes History: 1 Stroke History: 0 Vascular  Disease History: 1 Age Score: 2 Gender Score: 0         Lab Results  Component Value Date   WBC 11.2 (H) 07/22/2023   HGB 16.4 07/22/2023   HCT 49.9 07/22/2023   MCV 98.3 07/22/2023   PLT 207.0 07/22/2023   Lab Results  Component Value Date   CREATININE 1.06 01/08/2024   BUN 21 01/08/2024   NA 140 01/08/2024   K 4.0 01/08/2024   CL 105 01/08/2024   CO2 28 01/08/2024   Lab Results  Component Value Date   CHOL 112 07/22/2023   HDL 55.90 07/22/2023   LDLCALC 33 07/22/2023   TRIG 115.0 07/22/2023   CHOLHDL 2 07/22/2023    Lab Results  Component Value Date   HGBA1C 6.7 01/20/2024   Assessment & Plan    Assessment and Plan Assessment & Plan Persistent atrial fibrillation Persistent AFib with heart rate 94 bpm, confirmed by event monitor. Discussed potential triggers and ablation benefits. Current management with metoprolol ; considering dosage increase for better rate control. - Increase metoprolol  to 50 mg in the morning and 100 mg at night. - Schedule follow-up with a new cardiologist in six months. - Monitor for dizziness, fatigue, and blood pressure changes. - Discuss potential sleep study for sleep apnea if AFib persists.  Atherosclerotic heart disease of native coronary artery without angina No angina symptoms. Pressure with heavy lifting suggests non-cardiac origin. Active lifestyle and weight loss with Ozempic  may reduce cardiovascular risk. - Continue current activity level and weight loss efforts. - Monitor for any new or worsening symptoms.    1. Persistent atrial fibrillation:  -Persistent AFib with heart rate 94 bpm, confirmed by event monitor. Discussed potential triggers and ablation benefits.  Patient will try increasing rate control and then will be referred to discuss ablation in the future. - Increase metoprolol  to 50 mg in the morning and 100 mg at night. - Schedule follow-up with a new cardiologist in six months. - Monitor for dizziness,  fatigue, and blood pressure changes. - Discuss potential sleep study for sleep apnea if AFib persists.  2.  Coronary calcifications: -Patient denies any chest pain but notes some shortness of breath which may be related to deconditioning. -Continue Crestor  20 mg daily -Not on ASA 81 mg due to history of Eliquis   3.  Hyperlipidemia: - Patient's LDL cholesterol was 33 - Continue current treatment plan with Crestor  20 mg daily  4.  Aorta atherosclerosis: -Blood pressure stable today -Continue GDMT with Crestor  20 mg  5. Hypopituitarism: -Patient currently followed by endocrinology -Continue treatment plan as advised.  6. SIADH/Hyponatremia: -Patient's sodium was 140 -Continue daily V8 liberation with salt.  Disposition: Follow-up with None or APP in 6 months    Signed, Wyn Raddle, Jackee Shove, NP 02/12/2024, 12:45 PM [facility]

## 2024-02-13 ENCOUNTER — Encounter: Payer: Self-pay | Admitting: Nurse Practitioner

## 2024-02-13 ENCOUNTER — Ambulatory Visit: Attending: Nurse Practitioner | Admitting: Nurse Practitioner

## 2024-02-13 VITALS — BP 122/82 | HR 98 | Ht 70.0 in | Wt 226.0 lb

## 2024-02-13 DIAGNOSIS — E222 Syndrome of inappropriate secretion of antidiuretic hormone: Secondary | ICD-10-CM | POA: Diagnosis not present

## 2024-02-13 DIAGNOSIS — I4819 Other persistent atrial fibrillation: Secondary | ICD-10-CM | POA: Diagnosis not present

## 2024-02-13 DIAGNOSIS — I700 Atherosclerosis of aorta: Secondary | ICD-10-CM

## 2024-02-13 DIAGNOSIS — I2510 Atherosclerotic heart disease of native coronary artery without angina pectoris: Secondary | ICD-10-CM

## 2024-02-13 DIAGNOSIS — E2749 Other adrenocortical insufficiency: Secondary | ICD-10-CM | POA: Diagnosis not present

## 2024-02-13 DIAGNOSIS — E785 Hyperlipidemia, unspecified: Secondary | ICD-10-CM | POA: Diagnosis not present

## 2024-02-13 MED ORDER — METOPROLOL TARTRATE 50 MG PO TABS: ORAL_TABLET | ORAL | 1 refills | Status: DC

## 2024-02-13 NOTE — Patient Instructions (Signed)
 Medication Instructions:  INCREASE Metoprolol  to 50mg  in the morning and 100mg  in the pm daily  *If you need a refill on your cardiac medications before your next appointment, please call your pharmacy*  Lab Work: None ordered If you have labs (blood work) drawn today and your tests are completely normal, you will receive your results only by: MyChart Message (if you have MyChart) OR A paper copy in the mail If you have any lab test that is abnormal or we need to change your treatment, we will call you to review the results.  Testing/Procedures: None ordered   Follow-Up: At [facility], you and your health needs are our priority.  As part of our continuing mission to provide you with exceptional heart care, our providers are all part of one team.  This team includes your primary Cardiologist (physician) and Advanced Practice Providers or APPs (Physician Assistants and Nurse Practitioners) who all work together to provide you with the care you need, when you need it.  Your next appointment:   6 month(s)  Provider:   Georganna Archer, MD or Emeline Kriste COME  We recommend signing up for the patient portal called MyChart.  Sign up information is provided on this After Visit Summary.  MyChart is used to connect with patients for Virtual Visits (Telemedicine).  Patients are able to view lab/test results, encounter notes, upcoming appointments, etc.  Non-urgent messages can be sent to your provider as well.   To learn more about what you can do with MyChart, go to [email or web address].   Other Instructions

## 2024-03-06 ENCOUNTER — Telehealth: Payer: Self-pay

## 2024-03-06 ENCOUNTER — Other Ambulatory Visit (HOSPITAL_COMMUNITY): Payer: Self-pay

## 2024-03-06 NOTE — Telephone Encounter (Signed)
 Pharmacy Patient Advocate Encounter   Received notification from CoverMyMeds that prior authorization for Ozempic  (0.25 or 0.5 MG/DOSE) 2MG /3ML pen-injectors is required/requested.   Insurance verification completed.   The patient is insured through [facility] .   Per test claim: PA required; PA submitted to above mentioned insurance via Latent Key/confirmation #/EOC [id number] Status is pending

## 2024-03-06 NOTE — Telephone Encounter (Signed)
 Pharmacy Patient Advocate Encounter   Received notification from CoverMyMeds that prior authorization for Ozempic  (0.25 or 0.5 MG/DOSE) 2MG /3ML pen-injectors   i.   Insurance verification completed.   The patient is insured through [facility].  Action: Refill too soon. PA is not needed at this time.

## 2024-03-28 ENCOUNTER — Other Ambulatory Visit: Payer: Self-pay | Admitting: Internal Medicine

## 2024-04-01 ENCOUNTER — Other Ambulatory Visit: Payer: Self-pay | Admitting: Internal Medicine

## 2024-04-18 ENCOUNTER — Other Ambulatory Visit: Payer: Self-pay | Admitting: Nurse Practitioner

## 2024-04-25 ENCOUNTER — Other Ambulatory Visit: Payer: Self-pay | Admitting: Internal Medicine

## 2024-04-27 ENCOUNTER — Ambulatory Visit: Admitting: Internal Medicine

## 2024-04-27 ENCOUNTER — Encounter: Payer: Self-pay | Admitting: Internal Medicine

## 2024-04-27 VITALS — BP 120/80 | HR 80 | Temp 98.4°F | Ht 70.0 in | Wt 208.4 lb

## 2024-04-27 DIAGNOSIS — E118 Type 2 diabetes mellitus with unspecified complications: Secondary | ICD-10-CM

## 2024-04-27 DIAGNOSIS — Z7985 Long-term (current) use of injectable non-insulin antidiabetic drugs: Secondary | ICD-10-CM

## 2024-04-27 LAB — COMPREHENSIVE METABOLIC PANEL WITH GFR
ALT: 18 U/L (ref 0–53)
AST: 19 U/L (ref 0–37)
Albumin: 4.2 g/dL (ref 3.5–5.2)
Alkaline Phosphatase: 33 U/L — ABNORMAL LOW (ref 39–117)
BUN: 22 mg/dL (ref 6–23)
CO2: 26 meq/L (ref 19–32)
Calcium: 9.4 mg/dL (ref 8.4–10.5)
Chloride: 100 meq/L (ref 96–112)
Creatinine, Ser: 1.14 mg/dL (ref 0.40–1.50)
GFR: 61.66 mL/min (ref >60.00)
Glucose, Bld: 90 mg/dL (ref 70–99)
Potassium: 4 meq/L (ref 3.5–5.1)
Sodium: 137 meq/L (ref 135–145)
Total Bilirubin: 0.9 mg/dL (ref 0.2–1.2)
Total Protein: 7 g/dL (ref 6.0–8.3)

## 2024-04-27 LAB — CBC
HCT: 49.5 % (ref 39.0–52.0)
Hemoglobin: 16.2 g/dL (ref 13.0–17.0)
MCHC: 32.7 g/dL (ref 30.0–36.0)
MCV: 97.9 fl (ref 78.0–100.0)
Platelets: 212 K/uL (ref 150.0–400.0)
RBC: 5.06 Mil/uL (ref 4.22–5.81)
RDW: 14.6 % (ref 11.5–15.5)
WBC: 11.4 K/uL — ABNORMAL HIGH (ref 4.0–10.5)

## 2024-04-27 MED ORDER — OZEMPIC (1 MG/DOSE) 4 MG/3ML ~~LOC~~ SOPN: 1.0000 mg | PEN_INJECTOR | SUBCUTANEOUS | 3 refills | Status: AC

## 2024-04-27 NOTE — Assessment & Plan Note (Signed)
 Diabetes is likely secondary to steroid use for adrenal issues. Previous A1c was 6.7. Recent weight loss and dietary changes may have improved glycemic control. No symptoms of hyperglycemia or hypoglycemia reported. Checked A1c today to assess current glycemic control. Due to robust weight loss and some nausea the day after 1 mg ozempic  injection we will maintain that dosing unless HgA1c is worse on labs. Refill done for 1 mg ozempic . Removed 2 mg ozempic  from med list to avoid confusion. Foot exam done up to date on eye exam. On statin.

## 2024-04-27 NOTE — Patient Instructions (Signed)
 We will check the labs today and keep you on the 1 mg of the ozempic .

## 2024-04-27 NOTE — Progress Notes (Signed)
   Subjective:   Patient ID: Jermaine Soto, male    DOB: Nov 06, 1945, 78 y.o.   MRN: [id number]  Discussed the use of AI scribe software for clinical note transcription with the patient, who gave verbal consent to proceed.  History of Present Illness Jermaine Soto is a 78 year old male with diabetes and heart palpitations who presents for follow-up on weight management and medication side effects.  He experiences nausea the day after taking his Ozempic  injections, which has been decreasing over time. He is currently taking the prescribed dose, believed to be 1 mg, administered weekly by his wife. He has lost approximately 20-30 pounds over the past three months, with his weight now around 200 pounds according to his home scale.  He mentions having heart palpitations, although he does not feel them. He has previously undergone cardioversion, which required three attempts to restore rhythm, but his heart was out of rhythm the following day. 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 He feels more tired on some days compared to others.  He is currently taking metoprolol  but feels he might be taking too much.   Review of Systems  Constitutional:  Positive for fatigue.  HENT: Negative.    Eyes: Negative.   Respiratory:  Negative for cough, chest tightness and shortness of breath.   Cardiovascular:  Negative for chest pain, palpitations and leg swelling.  Gastrointestinal:  Negative for abdominal distention, abdominal pain, constipation, diarrhea, nausea and vomiting.  Musculoskeletal: Negative.   Skin: Negative.   Neurological: Negative.   Psychiatric/Behavioral: Negative.      Objective:  Physical Exam Constitutional:      Appearance: He is well-developed.  HENT:     Head: Normocephalic and atraumatic.  Cardiovascular:     Rate and Rhythm: Normal rate. Rhythm irregular.  Pulmonary:     Effort:  Pulmonary effort is normal. No respiratory distress.     Breath sounds: Normal breath sounds. No wheezing or rales.  Abdominal:     General: Bowel sounds are normal. There is no distension.     Palpations: Abdomen is soft.     Tenderness: There is no abdominal tenderness.  Musculoskeletal:     Cervical back: Normal range of motion.  Skin:    General: Skin is warm and dry.     Comments: Foot exam done  Neurological:     Mental Status: He is alert and oriented to person, place, and time.     Coordination: Coordination normal.     Vitals:   04/27/24 0801  BP: 120/80  Pulse: 80  Temp: 98.4 F (36.9 C)  TempSrc: Oral  SpO2: 98%  Weight: 208 lb 6.4 oz (94.5 kg)  Height: 5' 10 (1.778 m)    Assessment and Plan Assessment & Plan Type 2 diabetes mellitus   Diabetes is likely secondary to steroid use for adrenal issues. Previous A1c was 6.7. Recent weight loss and dietary changes may have improved glycemic control. No symptoms of hyperglycemia or hypoglycemia reported. Checked A1c today to assess current glycemic control. Due to robust weight loss and some nausea the day after 1 mg ozempic  injection we will maintain that dosing unless HgA1c is worse on labs. Refill done for 1 mg ozempic . Removed 2 mg ozempic  from med list to avoid confusion.

## 2024-04-28 LAB — HEMOGLOBIN A1C: Hgb A1c MFr Bld: 6.5 % (ref 4.6–6.5)

## 2024-04-29 ENCOUNTER — Ambulatory Visit: Payer: Self-pay | Admitting: Internal Medicine

## 2024-05-04 DIAGNOSIS — H353132 Nonexudative age-related macular degeneration, bilateral, intermediate dry stage: Secondary | ICD-10-CM | POA: Diagnosis not present

## 2024-05-04 DIAGNOSIS — H35352 Cystoid macular degeneration, left eye: Secondary | ICD-10-CM | POA: Diagnosis not present

## 2024-05-04 DIAGNOSIS — H35372 Puckering of macula, left eye: Secondary | ICD-10-CM | POA: Diagnosis not present

## 2024-05-04 DIAGNOSIS — H02833 Dermatochalasis of right eye, unspecified eyelid: Secondary | ICD-10-CM | POA: Diagnosis not present

## 2024-05-04 DIAGNOSIS — H02836 Dermatochalasis of left eye, unspecified eyelid: Secondary | ICD-10-CM | POA: Diagnosis not present

## 2024-05-24 ENCOUNTER — Other Ambulatory Visit: Payer: Self-pay | Admitting: Nurse Practitioner

## 2024-05-24 DIAGNOSIS — I4891 Unspecified atrial fibrillation: Secondary | ICD-10-CM

## 2024-05-25 NOTE — Telephone Encounter (Signed)
 Prescription refill request for Eliquis  received. Indication:afib Last office visit:9/25 Scr: 1.14  11/25 Age:78 Weight:94.5  kg  Prescription refilled

## 2024-05-26 ENCOUNTER — Encounter: Payer: Self-pay | Admitting: Pharmacist

## 2024-05-26 NOTE — Progress Notes (Signed)
 Pharmacy Quality Measure Review  This patient is appearing on a report for being at risk of failing the adherence measure for diabetes medications this calendar year.   Medication: Ozempic  Last fill date: 04/28/24 for 84 day supply  Insurance report was not up to date. No action needed at this time.   Darrelyn Drum, PharmD, BCPS, CPP Clinical Pharmacist Practitioner [facility] [phone]

## 2024-06-03 ENCOUNTER — Other Ambulatory Visit: Payer: Self-pay | Admitting: "Endocrinology

## 2024-06-03 DIAGNOSIS — E230 Hypopituitarism: Secondary | ICD-10-CM

## 2024-07-03 ENCOUNTER — Other Ambulatory Visit: Payer: Self-pay

## 2024-07-03 DIAGNOSIS — E230 Hypopituitarism: Secondary | ICD-10-CM

## 2024-07-05 ENCOUNTER — Other Ambulatory Visit: Payer: Self-pay | Admitting: "Endocrinology

## 2024-07-05 DIAGNOSIS — E230 Hypopituitarism: Secondary | ICD-10-CM

## 2024-07-13 ENCOUNTER — Other Ambulatory Visit

## 2024-07-13 DIAGNOSIS — E230 Hypopituitarism: Secondary | ICD-10-CM

## 2024-07-15 ENCOUNTER — Other Ambulatory Visit

## 2024-07-16 LAB — PSA: PSA: 0.07 ng/mL

## 2024-07-16 LAB — CBC WITH DIFFERENTIAL/PLATELET
Absolute Lymphocytes: 3757 {cells}/uL (ref 850–3900)
Absolute Monocytes: 962 {cells}/uL — ABNORMAL HIGH (ref 200–950)
Basophils Absolute: 52 {cells}/uL (ref 0–200)
Basophils Relative: 0.4 %
Eosinophils Absolute: 130 {cells}/uL (ref 15–500)
Eosinophils Relative: 1 %
HCT: 50.5 % (ref 39.4–51.1)
Hemoglobin: 16.4 g/dL (ref 13.2–17.1)
MCH: 32 pg (ref 27.0–33.0)
MCHC: 32.5 g/dL (ref 31.6–35.4)
MCV: 98.6 fL (ref 81.4–101.7)
MPV: 10.2 fL (ref 7.5–12.5)
Monocytes Relative: 7.4 %
Neutro Abs: 8099 {cells}/uL — ABNORMAL HIGH (ref 1500–7800)
Neutrophils Relative %: 62.3 %
Platelets: 211 10*3/uL (ref 140–400)
RBC: 5.12 Million/uL (ref 4.20–5.80)
RDW: 13 % (ref 11.0–15.0)
Total Lymphocyte: 28.9 %
WBC: 13 10*3/uL — ABNORMAL HIGH (ref 3.8–10.8)

## 2024-07-16 LAB — BASIC METABOLIC PANEL WITH GFR
BUN: 24 mg/dL (ref 7–25)
CO2: 29 mmol/L (ref 20–32)
Calcium: 9.7 mg/dL (ref 8.6–10.3)
Chloride: 102 mmol/L (ref 98–110)
Creat: 0.99 mg/dL (ref 0.70–1.28)
Glucose, Bld: 81 mg/dL (ref 65–99)
Potassium: 4.6 mmol/L (ref 3.5–5.3)
Sodium: 141 mmol/L (ref 135–146)
eGFR: 78 mL/min/{1.73_m2}

## 2024-07-16 LAB — T4, FREE: Free T4: 1.8 ng/dL (ref 0.8–1.8)

## 2024-07-16 LAB — TESTOSTERONE: Testosterone: 567 ng/dL (ref 250–827)

## 2024-07-16 LAB — TSH: TSH: 0.01 m[IU]/L — ABNORMAL LOW (ref 0.40–4.50)

## 2024-07-20 ENCOUNTER — Ambulatory Visit: Admitting: "Endocrinology

## 2024-08-25 ENCOUNTER — Ambulatory Visit: Admitting: Internal Medicine
